# Patient Record
Sex: Female | Born: 1985 | Race: White | Hispanic: Yes | Smoking: Current every day smoker
Health system: Southern US, Community
[De-identification: ages and names within clinical notes are randomized; demographics above are authoritative.]

## PROBLEM LIST (undated history)

## (undated) DIAGNOSIS — F32A Depression, unspecified: Secondary | ICD-10-CM

## (undated) DIAGNOSIS — B009 Herpesviral infection, unspecified: Secondary | ICD-10-CM

## (undated) DIAGNOSIS — F1120 Opioid dependence, uncomplicated: Secondary | ICD-10-CM

## (undated) DIAGNOSIS — Z8619 Personal history of other infectious and parasitic diseases: Secondary | ICD-10-CM

## (undated) DIAGNOSIS — F329 Major depressive disorder, single episode, unspecified: Secondary | ICD-10-CM

## (undated) DIAGNOSIS — F1921 Other psychoactive substance dependence, in remission: Secondary | ICD-10-CM

## (undated) HISTORY — DX: Personal history of other infectious and parasitic diseases: Z86.19

## (undated) HISTORY — DX: Opioid dependence, uncomplicated: F11.20

## (undated) HISTORY — DX: Herpesviral infection, unspecified: B00.9

## (undated) HISTORY — PX: BREAST SURGERY: SHX581

## (undated) HISTORY — PX: BREAST BIOPSY: SHX20

---

## 2013-10-25 LAB — OB RESULTS CONSOLE GC/CHLAMYDIA
CHLAMYDIA, DNA PROBE: NEGATIVE
Gonorrhea: NEGATIVE

## 2013-10-25 LAB — OB RESULTS CONSOLE RPR: RPR: NONREACTIVE

## 2013-10-25 LAB — OB RESULTS CONSOLE HEPATITIS B SURFACE ANTIGEN: Hepatitis B Surface Ag: NEGATIVE

## 2013-10-25 LAB — OB RESULTS CONSOLE HIV ANTIBODY (ROUTINE TESTING): HIV: NONREACTIVE

## 2013-10-25 LAB — OB RESULTS CONSOLE RUBELLA ANTIBODY, IGM: Rubella: IMMUNE

## 2014-01-25 NOTE — L&D Delivery Note (Signed)
Delivery Note At 9:14 PM a viable and healthy female was delivered via Vaginal, Spontaneous Delivery (Presentation: ; Occiput Anterior).  APGAR: 8, 9; weight P .   Placenta status: Intact, Spontaneous.  Cord: 3 vessels with the following complications: Nuchal  Anesthesia: Epidural  Episiotomy: None Lacerations: None Suture Repair: N/A Est. Blood Loss (mL): 400  Mom to postpartum.  Baby to Couplet care / Skin to Skin.  [name], Angelamarie Avakian 04/17/2014, 9:52 PM  Tdap in PNC/RI/Br/O+/Contra?Nexplanon

## 2014-03-25 LAB — OB RESULTS CONSOLE GBS: STREP GROUP B AG: POSITIVE

## 2014-04-17 ENCOUNTER — Encounter (HOSPITAL_COMMUNITY): Payer: Self-pay | Admitting: *Deleted

## 2014-04-17 ENCOUNTER — Inpatient Hospital Stay (HOSPITAL_COMMUNITY): Payer: Medicaid Other | Admitting: Anesthesiology

## 2014-04-17 ENCOUNTER — Inpatient Hospital Stay (HOSPITAL_COMMUNITY)
Admission: AD | Admit: 2014-04-17 | Discharge: 2014-04-19 | DRG: 775 | Disposition: A | Discharge status: Home / Self Care | Payer: Medicaid Other | Source: Ambulatory Visit | Attending: Obstetrics and Gynecology | Admitting: Obstetrics and Gynecology

## 2014-04-17 DIAGNOSIS — O99334 Smoking (tobacco) complicating childbirth: Secondary | ICD-10-CM | POA: Diagnosis present

## 2014-04-17 DIAGNOSIS — Z3A39 39 weeks gestation of pregnancy: Secondary | ICD-10-CM | POA: Diagnosis present

## 2014-04-17 DIAGNOSIS — O99824 Streptococcus B carrier state complicating childbirth: Secondary | ICD-10-CM | POA: Diagnosis present

## 2014-04-17 DIAGNOSIS — F17210 Nicotine dependence, cigarettes, uncomplicated: Secondary | ICD-10-CM | POA: Diagnosis present

## 2014-04-17 DIAGNOSIS — Z3483 Encounter for supervision of other normal pregnancy, third trimester: Secondary | ICD-10-CM | POA: Diagnosis present

## 2014-04-17 HISTORY — DX: Other psychoactive substance dependence, in remission: F19.21

## 2014-04-17 LAB — CBC
HCT: 33.7 % — ABNORMAL LOW (ref 36.0–46.0)
HEMOGLOBIN: 11.5 g/dL — AB (ref 12.0–15.0)
MCH: 32.8 pg (ref 26.0–34.0)
MCHC: 34.1 g/dL (ref 30.0–36.0)
MCV: 96 fL (ref 78.0–100.0)
Platelets: 354 10*3/uL (ref 150–400)
RBC: 3.51 MIL/uL — AB (ref 3.87–5.11)
RDW: 14 % (ref 11.5–15.5)
WBC: 12 10*3/uL — AB (ref 4.0–10.5)

## 2014-04-17 LAB — TYPE AND SCREEN
ABO/RH(D): O POS
ANTIBODY SCREEN: NEGATIVE

## 2014-04-17 LAB — ABO/RH: ABO/RH(D): O POS

## 2014-04-17 LAB — RPR: RPR Ser Ql: NONREACTIVE

## 2014-04-17 MED ORDER — LIDOCAINE HCL (PF) 1 % IJ SOLN
30.0000 mL | INTRAMUSCULAR | Status: DC | PRN
  Filled 2014-04-17: qty 30

## 2014-04-17 MED ORDER — DIPHENHYDRAMINE HCL 25 MG PO CAPS: 25.0000 mg | ORAL_CAPSULE | Freq: Four times a day (QID) | ORAL | Status: DC | PRN

## 2014-04-17 MED ORDER — IBUPROFEN 600 MG PO TABS
600.0000 mg | ORAL_TABLET | Freq: Four times a day (QID) | ORAL | Status: DC
  Administered 2014-04-18 – 2014-04-19 (×4): 600 mg via ORAL
  Filled 2014-04-17 (×6): qty 1

## 2014-04-17 MED ORDER — SENNOSIDES-DOCUSATE SODIUM 8.6-50 MG PO TABS
2.0000 | ORAL_TABLET | ORAL | Status: DC
  Administered 2014-04-18: 2 via ORAL
  Filled 2014-04-17: qty 2

## 2014-04-17 MED ORDER — PHENYLEPHRINE 40 MCG/ML (10ML) SYRINGE FOR IV PUSH (FOR BLOOD PRESSURE SUPPORT)
80.0000 ug | PREFILLED_SYRINGE | INTRAVENOUS | Status: DC | PRN
  Filled 2014-04-17: qty 2

## 2014-04-17 MED ORDER — EPHEDRINE 5 MG/ML INJ
10.0000 mg | INTRAVENOUS | Status: DC | PRN
  Filled 2014-04-17: qty 2

## 2014-04-17 MED ORDER — FLEET ENEMA 7-19 GM/118ML RE ENEM: 1.0000 | ENEMA | RECTAL | Status: DC | PRN

## 2014-04-17 MED ORDER — DIBUCAINE 1 % RE OINT
1.0000 "application " | TOPICAL_OINTMENT | RECTAL | Status: DC | PRN
  Administered 2014-04-18: 1 via RECTAL
  Filled 2014-04-17: qty 28

## 2014-04-17 MED ORDER — CLINDAMYCIN PHOSPHATE 900 MG/50ML IV SOLN
900.0000 mg | Freq: Three times a day (TID) | INTRAVENOUS | Status: DC
  Administered 2014-04-17 (×2): 900 mg via INTRAVENOUS
  Filled 2014-04-17 (×3): qty 50

## 2014-04-17 MED ORDER — BENZOCAINE-MENTHOL 20-0.5 % EX AERO
1.0000 "application " | INHALATION_SPRAY | CUTANEOUS | Status: DC | PRN
  Administered 2014-04-18: 1 via TOPICAL
  Filled 2014-04-17: qty 56

## 2014-04-17 MED ORDER — BUTORPHANOL TARTRATE 1 MG/ML IJ SOLN
1.0000 mg | INTRAMUSCULAR | Status: DC | PRN
  Administered 2014-04-17: 1 mg via INTRAVENOUS
  Filled 2014-04-17: qty 1

## 2014-04-17 MED ORDER — WITCH HAZEL-GLYCERIN EX PADS
1.0000 "application " | MEDICATED_PAD | CUTANEOUS | Status: DC | PRN
  Administered 2014-04-18: 1 via TOPICAL

## 2014-04-17 MED ORDER — ACETAMINOPHEN 325 MG PO TABS: 650.0000 mg | ORAL_TABLET | ORAL | Status: DC | PRN

## 2014-04-17 MED ORDER — OXYCODONE-ACETAMINOPHEN 5-325 MG PO TABS: 2.0000 | ORAL_TABLET | ORAL | Status: DC | PRN

## 2014-04-17 MED ORDER — PHENYLEPHRINE 40 MCG/ML (10ML) SYRINGE FOR IV PUSH (FOR BLOOD PRESSURE SUPPORT)
80.0000 ug | PREFILLED_SYRINGE | INTRAVENOUS | Status: DC | PRN
  Filled 2014-04-17: qty 2
  Filled 2014-04-17: qty 20

## 2014-04-17 MED ORDER — OXYCODONE-ACETAMINOPHEN 5-325 MG PO TABS: 1.0000 | ORAL_TABLET | ORAL | Status: DC | PRN

## 2014-04-17 MED ORDER — DIPHENHYDRAMINE HCL 50 MG/ML IJ SOLN: 12.5000 mg | INTRAMUSCULAR | Status: DC | PRN

## 2014-04-17 MED ORDER — LACTATED RINGERS IV SOLN: 500.0000 mL | Freq: Once | INTRAVENOUS | Status: DC

## 2014-04-17 MED ORDER — LACTATED RINGERS IV SOLN
INTRAVENOUS | Status: DC
  Administered 2014-04-17 (×3): via INTRAVENOUS

## 2014-04-17 MED ORDER — CITRIC ACID-SODIUM CITRATE 334-500 MG/5ML PO SOLN: 30.0000 mL | ORAL | Status: DC | PRN

## 2014-04-17 MED ORDER — OXYTOCIN 40 UNITS IN LACTATED RINGERS INFUSION - SIMPLE MED
1.0000 m[IU]/min | INTRAVENOUS | Status: DC
  Administered 2014-04-17: 2 m[IU]/min via INTRAVENOUS

## 2014-04-17 MED ORDER — LACTATED RINGERS IV SOLN: INTRAVENOUS | Status: DC

## 2014-04-17 MED ORDER — LANOLIN HYDROUS EX OINT: TOPICAL_OINTMENT | CUTANEOUS | Status: DC | PRN

## 2014-04-17 MED ORDER — ZOLPIDEM TARTRATE 5 MG PO TABS: 5.0000 mg | ORAL_TABLET | Freq: Every evening | ORAL | Status: DC | PRN

## 2014-04-17 MED ORDER — LACTATED RINGERS IV SOLN
500.0000 mL | INTRAVENOUS | Status: DC | PRN
  Administered 2014-04-17: 1000 mL via INTRAVENOUS

## 2014-04-17 MED ORDER — TERBUTALINE SULFATE 1 MG/ML IJ SOLN
0.2500 mg | Freq: Once | INTRAMUSCULAR | Status: DC | PRN
Start: 2014-04-17 — End: 2014-04-17
  Filled 2014-04-17: qty 1

## 2014-04-17 MED ORDER — OXYTOCIN BOLUS FROM INFUSION: 500.0000 mL | INTRAVENOUS | Status: DC

## 2014-04-17 MED ORDER — SIMETHICONE 80 MG PO CHEW: 80.0000 mg | CHEWABLE_TABLET | ORAL | Status: DC | PRN

## 2014-04-17 MED ORDER — PRENATAL MULTIVITAMIN CH
1.0000 | ORAL_TABLET | Freq: Every day | ORAL | Status: DC
  Administered 2014-04-18 – 2014-04-19 (×2): 1 via ORAL
  Filled 2014-04-17 (×2): qty 1

## 2014-04-17 MED ORDER — FENTANYL 2.5 MCG/ML BUPIVACAINE 1/10 % EPIDURAL INFUSION (WH - ANES)
INTRAMUSCULAR | Status: DC | PRN
  Administered 2014-04-17: 14 mL/h via EPIDURAL

## 2014-04-17 MED ORDER — ONDANSETRON HCL 4 MG/2ML IJ SOLN: 4.0000 mg | Freq: Four times a day (QID) | INTRAMUSCULAR | Status: DC | PRN

## 2014-04-17 MED ORDER — FENTANYL 2.5 MCG/ML BUPIVACAINE 1/10 % EPIDURAL INFUSION (WH - ANES)
14.0000 mL/h | INTRAMUSCULAR | Status: DC | PRN
  Filled 2014-04-17: qty 125

## 2014-04-17 MED ORDER — OXYTOCIN 40 UNITS IN LACTATED RINGERS INFUSION - SIMPLE MED
62.5000 mL/h | INTRAVENOUS | Status: DC
  Filled 2014-04-17: qty 1000

## 2014-04-17 MED ORDER — LIDOCAINE HCL (PF) 1 % IJ SOLN
INTRAMUSCULAR | Status: DC | PRN
  Administered 2014-04-17 (×2): 4 mL

## 2014-04-17 MED ORDER — ONDANSETRON HCL 4 MG/2ML IJ SOLN
4.0000 mg | INTRAMUSCULAR | Status: DC | PRN
Start: 2014-04-17 — End: 2014-04-19

## 2014-04-17 MED ORDER — ONDANSETRON HCL 4 MG PO TABS: 4.0000 mg | ORAL_TABLET | ORAL | Status: DC | PRN

## 2014-04-17 NOTE — Anesthesia Procedure Notes (Signed)
Epidural Patient location during procedure: OB Start time: 04/17/2014 2:50 PM  Staffing Anesthesiologist: Karie SchwalbeJUDD, Tikita Mabee Performed by: anesthesiologist   Preanesthetic Checklist Completed: patient identified, site marked, surgical consent, pre-op evaluation, timeout performed, IV checked, risks and benefits discussed and monitors and equipment checked  Epidural Patient position: sitting Prep: site prepped and draped and DuraPrep Patient monitoring: continuous pulse ox and blood pressure Approach: midline Location: L3-L4 Injection technique: LOR saline  Needle:  Needle type: Tuohy  Needle gauge: 17 G Needle length: 9 cm and 9 Needle insertion depth: 5 cm cm Catheter type: closed end flexible Catheter size: 19 Gauge Catheter at skin depth: 10 cm Test dose: negative  Assessment Events: blood not aspirated, injection not painful, no injection resistance, negative IV test and no paresthesia  Additional Notes Patient identified. Risks/Benefits/Options discussed with patient including but not limited to bleeding, infection, nerve damage, paralysis, failed block, incomplete pain control, headache, blood pressure changes, nausea, vomiting, reactions to medication both or allergic, itching and postpartum back pain. Confirmed with bedside nurse the patient's most recent platelet count. Confirmed with patient that they are not currently taking any anticoagulation, have any bleeding history or any family history of bleeding disorders. Patient expressed understanding and wished to proceed. All questions were answered. Sterile technique was used throughout the entire procedure. Please see nursing notes for vital signs. Test dose was given through epidural catheter and negative prior to continuing to dose epidural or start infusion. Warning signs of high block given to the patient including shortness of breath, tingling/numbness in hands, complete motor block, or any concerning symptoms with instructions  to call for help. Patient was given instructions on fall risk and not to get out of bed. All questions and concerns addressed with instructions to call with any issues or inadequate analgesia.

## 2014-04-17 NOTE — Progress Notes (Signed)
Patient ID: Beth GreenlandAngelica Colasurdo, female   DOB: 10/03/1985, 29 y.o.   MRN: [id number]  ROM this AM 3:30, clear  +FM, no c/o's, cont LOF  AFVSS gen NAD FHTs 130's good var, category 1 toco occ  SVE 1/30/-3, vtx Posterior  Start pitocin to augment Epidural prn

## 2014-04-17 NOTE — Anesthesia Preprocedure Evaluation (Signed)
Anesthesia Evaluation  Patient identified by MRN, date of birth, ID band Patient awake    Reviewed: Allergy & Precautions, NPO status , Patient's Chart, lab work & pertinent test results  History of Anesthesia Complications Negative for: history of anesthetic complications  Airway Mallampati: II  TM Distance: >3 FB Neck ROM: Full    Dental no notable dental hx. (+) Dental Advisory Given   Pulmonary Current Smoker,  breath sounds clear to auscultation  Pulmonary exam normal       Cardiovascular negative cardio ROS  Rhythm:Regular Rate:Normal     Neuro/Psych negative neurological ROS  negative psych ROS   GI/Hepatic negative GI ROS, Neg liver ROS,   Endo/Other  negative endocrine ROSobesity  Renal/GU negative Renal ROS  negative genitourinary   Musculoskeletal negative musculoskeletal ROS (+)   Abdominal   Peds negative pediatric ROS (+)  Hematology negative hematology ROS (+)   Anesthesia Other Findings   Reproductive/Obstetrics (+) Pregnancy                             Anesthesia Physical Anesthesia Plan  ASA: II  Anesthesia Plan: Epidural   Post-op Pain Management:    Induction:   Airway Management Planned:   Additional Equipment:   Intra-op Plan:   Post-operative Plan:   Informed Consent: I have reviewed the patients History and Physical, chart, labs and discussed the procedure including the risks, benefits and alternatives for the proposed anesthesia with the patient or authorized representative who has indicated his/her understanding and acceptance.   Dental advisory given  Plan Discussed with: CRNA  Anesthesia Plan Comments:         Anesthesia Quick Evaluation

## 2014-04-17 NOTE — H&P (Signed)
NAMHowie [name]:  [name], [name]          ACCOUNT NO.:  [id number]  MEDICAL RECORD NO.:  [id number]  LOCATION:  9169                          FACILITY:  [facility]  PHYSICIAN:  [name], M.D. DATE OF BIRTH:  26-Feb-1985  DATE OF ADMISSION:  04/17/2014 DATE OF DISCHARGE:                             HISTORY & PHYSICAL   HISTORY OF PRESENT ILLNESS:  This is a 29 year old white female, para 2- 0-1-2, [facility] with premature rupture of the membranes.  Her blood group and type is O positive with a negative antibody, RPR negative, urine culture negative, hepatitis B surface antigen negative, HIV negative, GC and Chlamydia negative, rubella immune, cystic fibrosis screen negative, first trimester screen negative, MSAFP negative.  One-hour Glucola 71, group B strep positive. The patient began her prenatal course at 13 weeks.  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  At 31 weeks, she had some runs of palpitations, resolved with rest.  She was advised to begin Valtrex suppression at 36 weeks.  At 3:30 a.m., she had spontaneous rupture of membranes, came to the hospital, ruptured membranes was confirmed, and she was admitted.  Since admission, she has begun contracting infrequently.  Nothing painful.  Her group B strep was positive, was tested to clindamycin and resistance was not detected.  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  She has had sinusitis a couple of times a year, breast reduction in 2009.  ALLERGIES:  Amoxicillin and penicillin, both caused hives as a child. No latex allergy.  No food allergy.  SOCIAL HISTORY:  The patient is a current every day smoker, one-half pack a day at the onset of pregnancy.  Does not drink.  Denies illicit drugs for 20  months, has a 12th grade high school education plus 2 years of college.  She works as a Conservation officer, naturecashier at [employer].  At the onset of pregnancy, had a partner for 6 months, but she is single.  FAMILY HISTORY:  Mother cancer of the breast at age 31 and a disorder of her thyroid gland.  Maternal grandmother cancer of the breast, diabetes, and cancer of the lung.  Maternal grandfather heart disease and diabetes and father died at age 31 of an MI.  PHYSICAL EXAMINATION:  VITAL SIGNS:  On admission, temperature 97.7, pulse 68, respirations 18, blood pressure 113/70. HEART:  Normal size and sounds.  No murmurs. LUNGS:  Clear to auscultation.  At her last prenatal visit, the blood pressure was 116/74, the cervix was 0 cm, 20,% and the fundal height was 36 cm.  On admission to the hospital, the cervix was thought to be maybe 1 cm posterior.  Rupture of membranes was confirmed.  ADMITTING IMPRESSION: 1. Intrauterine pregnancy at 39 weeks and 1 day. 2. Premature rupture of the membranes. 3. Positive group B strep with a penicillin allergy.  She is admitted     and started on clindamycin.  The group B strep was tested for     clindamycin  resistance and none was found.     Beth Pro. Ambrose Mantle, M.D.     TFH/MEDQ  D:  04/17/2014  T:  04/17/2014  Job:  [id number]

## 2014-04-17 NOTE — Progress Notes (Signed)
Patient ID: [name], female   DOB: 06/26/1985, 29 y.o.   MRN: [id number]  Comfortable with epidural  AFVSS gen NAD FHTs 120-130's, mod var, category 1 toco q 2-254min  SVE 4/70/-1  Continue current mgmt Expect SVD

## 2014-04-17 NOTE — Plan of Care (Signed)
Problem: Consults Goal: Birthing Suites Patient Information Press F2 to bring up selections list Outcome: Completed/Met Date Met:  04/17/14  Pt 37-[redacted] weeks EGA and Other (specify with a note) SROM

## 2014-04-17 NOTE — MAU Note (Signed)
Pt states that she SROM at 0330 and denies bleeding. Pt states that baby is active and she is starting to feel some tightnings but no pain.

## 2014-04-17 NOTE — MAU Note (Signed)
Pt reports ROM at 0330,

## 2014-04-18 LAB — CBC
HEMATOCRIT: 30.5 % — AB (ref 36.0–46.0)
HEMOGLOBIN: 10.5 g/dL — AB (ref 12.0–15.0)
MCH: 33 pg (ref 26.0–34.0)
MCHC: 34.4 g/dL (ref 30.0–36.0)
MCV: 95.9 fL (ref 78.0–100.0)
Platelets: 324 10*3/uL (ref 150–400)
RBC: 3.18 MIL/uL — ABNORMAL LOW (ref 3.87–5.11)
RDW: 13.8 % (ref 11.5–15.5)
WBC: 18.2 10*3/uL — AB (ref 4.0–10.5)

## 2014-04-18 LAB — CCBB MATERNAL DONOR DRAW

## 2014-04-18 NOTE — Progress Notes (Signed)
UR chart review completed.  

## 2014-04-18 NOTE — Anesthesia Postprocedure Evaluation (Signed)
Anesthesia Post Note  Patient: [name]  Procedure(s) Performed: * No procedures listed *  Anesthesia type: Epidural  Patient location: Mother/Baby  Post pain: Pain level controlled  Post assessment: Post-op Vital signs reviewed  Last Vitals:  Filed Vitals:   04/18/14 0410  BP: 104/68  Pulse: 68  Temp: 36.7 C  Resp: 18    Post vital signs: Reviewed  Level of consciousness:alert  Complications: No apparent anesthesia complications

## 2014-04-18 NOTE — Lactation Note (Signed)
This note was copied from the chart of Beth Alexza Rubye Oaksickerson. Lactation Consultation Note  Patient Name: [name] AVWUJ'WToday's Date: 04/18/2014 Reason for consult: Initial assessment;Other (Comment) (mom had Breast reduction ( bilaterally 2009 after 1st 2 babies )  Baby is 7716 hours old and has been to the breast several times 10 -30 mins. Per mom the baby last fed at 1315 for 10 mins. Per mom swallows noted and the abby sustained latch . Per mom had to release suction baby fell asleep after 10 mins . Also voided. LC discussed with mom and dad - with a hx of breast reduction , it is always a wait and see for milk volume and supply .  Using a DEBP for post pumping is indicated to enhance milk supply . Per mom has had several breast changes - sore ness ( nipples and breast ),  Areolas darkened, cup size increased form C to Triple E . LC mentioned to mom all the signs are good  for milk supply.  Post pumping would still be indicated. LC assessed breast tissue with moms permission, ( per mom thinking the nipple and areola were removed and reapplied. Nipple erect, and areola compressible , no adhesions at the base of the nipple. LC reviewed hand expressing and latching technique to consistently obtain depth. Did not see colostrum when hand expressed. Since the baby just fed for 10 mins , and is presently sleeping , LC encouraged mom to call for LC feeding assessment.  MBU RN aware mom needs to be set up with a DEBP.  Mother informed of post-discharge support and given phone number to the lactation department, including services for phone call assistance; out-patient appointments;  and breastfeeding support group. List of other breastfeeding resources in the community given in the handout. Encouraged mother to call for problems or concerns related to breastfeeding.    Maternal Data Has patient been taught Hand Expression?: Yes Does the patient have breastfeeding experience prior to this  delivery?: Yes  Feeding Feeding Type:  (baby just finished breast feeding )  LATCH Score/Interventions                Intervention(s): Breastfeeding basics reviewed (see LC note )     Lactation Tools Discussed/Used     Consult Status Consult Status: Follow-up Date: 04/18/14 Follow-up type: In-patient    Kathrin Greathouseorio, Khayman Kirsch Ann 04/18/2014, 1:51 PM

## 2014-04-18 NOTE — Progress Notes (Signed)
Post Partum Day 1 Subjective: no complaints, up ad lib, tolerating PO and nl lochia, pain controlled  Objective: Blood pressure 104/68, pulse 68, temperature 98 F (36.7 C), temperature source Oral, resp. rate 18, height 5\' 4"  (1.626 m), weight 87.232 kg (192 lb 5 oz), SpO2 100 %, unknown if currently breastfeeding.  Physical Exam:  General: alert and no distress Lochia: appropriate Uterine Fundus: firm   Recent Labs  04/17/14 0540 04/18/14 0545  HGB 11.5* 10.5*  HCT 33.7* 30.5*    Assessment/Plan: Plan for discharge tomorrow, Breastfeeding and Lactation consult.  Routine care.     LOS: 1 day   [name] 04/18/2014, 7:44 AM

## 2014-04-19 MED ORDER — IBUPROFEN 600 MG PO TABS: 600.0000 mg | ORAL_TABLET | Freq: Four times a day (QID) | ORAL | Status: DC

## 2014-04-19 NOTE — Lactation Note (Signed)
This note was copied from the chart of Beth Kyree Rubye Oaksickerson. Lactation Consultation Note: Mother is using a #24 nipple shield . Observed infant  Vigorous with strong suckling. Mother states that the nipple shield makes breastfeeding so much better. She has been post pumping and obtaining 20-30 ml. Mother has been supplement with a curved tip syringe. Advised to prefill the nipple shield and give infant EBM with syringe while infant is latched. Mothers breast are filling. She states she is active with WIC. [facility]. She was given a hand pump with #30 flanges. Mother advised to post pump for 15 mins on each breast. Advised mother in treatment of severe engorgement. Mother advised to breastfeed 8-12 times in 24 hours. She was also scheduled a LC follow up on March 30 at 9am.   Patient Name: [name] Today's Date: 04/19/2014 Reason for consult: Follow-up assessment   Maternal Data    Feeding Feeding Type: Breast Fed Length of feed: 10 min (observied small amt of milk in the nipple shield)  LATCH Score/Interventions Latch: Grasps breast easily, tongue down, lips flanged, rhythmical sucking. Intervention(s): Adjust position  Audible Swallowing: Spontaneous and intermittent Intervention(s): Skin to skin  Type of Nipple: Everted at rest and after stimulation  Comfort (Breast/Nipple): Filling, red/small blisters or bruises, mild/mod discomfort  Problem noted: Filling;Mild/Moderate discomfort  Hold (Positioning): No assistance needed to correctly position infant at breast. Intervention(s): Support Pillows;Position options  LATCH Score: 9  Lactation Tools Discussed/Used Tools: Nipple Shields Nipple shield size: 24 Shell Type: Sore   Consult Status Consult Status: Follow-up Date: 04/24/14 Follow-up type: In-patient    Stevan BornKendrick, Nikeria Kalman [facility] 04/19/2014, 10:31 AM

## 2014-04-19 NOTE — Discharge Summary (Signed)
Obstetric Discharge Summary Reason for Admission: rupture of membranes Prenatal Procedures: none Intrapartum Procedures: spontaneous vaginal delivery Postpartum Procedures: none Complications-Operative and Postpartum: none HEMOGLOBIN  Date Value Ref Range Status  04/18/2014 10.5* 12.0 - 15.0 g/dL Final   HCT  Date Value Ref Range Status  04/18/2014 30.5* 36.0 - 46.0 % Final    Physical Exam:  General: alert Lochia: appropriate Uterine Fundus: firm   Discharge Diagnoses: Term Pregnancy-delivered  Discharge Information: Date: 04/19/2014 Activity: pelvic rest Diet: routine Medications: Ibuprofen Condition: stable Instructions: refer to practice specific booklet Discharge to: home Follow-up Information    Follow up with [name], Jody, MD. Schedule an appointment as soon as possible for a visit in 6 weeks.   Specialty:  Obstetrics and Gynecology   Contact information:   [address] [place] [id number] [phone]       Newborn Data: Live born female  Birth Weight: 7 lb 2.5 oz (3245 g) APGAR: 8, 9  Home with mother.  Osualdo Hansell D 04/19/2014, 9:47 AM

## 2014-04-19 NOTE — Lactation Note (Signed)
This note was copied from the chart of Beth Taite Rubye Oaksickerson. Lactation Consultation Note Requested LC for difficulty latching. Mom has had breast reduction in past, has semi flat nipples, compressible, rolls well in finger tips to large everted nipples. Fitted w/#24NS, gave #20 as well. Nipples Especially Lt. Nipple longated. Mom has large breast, colostrum flows out well. Latched , NS kinda large for baby's mouth, when positioned and adjusted flange, latched well and BF 10 min, aggressive. Heard swallows. Moms breast filling. Has DEBP set up in rm. Instructed on set up and cleaning, mom pumped 15ml.encouraged to post-pump for 15 min. D/t breast reduction and stimulating breast.  Gave shells to assist in everting nipples. Encouraged to wear in bra.  Discussed proper latching and positions. Answered several questions.  Patient Name: [name] JYNWG'NToday's Date: 04/19/2014 Reason for consult: Follow-up assessment;Difficult latch;Breast/nipple pain   Maternal Data    Feeding Feeding Type: Breast Fed Length of feed: 15 min  LATCH Score/Interventions Latch: Grasps breast easily, tongue down, lips flanged, rhythmical sucking. Intervention(s): Adjust position;Assist with latch;Breast massage;Breast compression  Audible Swallowing: Spontaneous and intermittent Intervention(s): Skin to skin;Hand expression  Type of Nipple: Everted at rest and after stimulation (short shaft)  Comfort (Breast/Nipple): Filling, red/small blisters or bruises, mild/mod discomfort  Problem noted: Mild/Moderate discomfort Interventions (Mild/moderate discomfort): Comfort gels;Breast shields;Post-pump;Hand massage;Hand expression  Hold (Positioning): Assistance needed to correctly position infant at breast and maintain latch. Intervention(s): Skin to skin;Position options;Support Pillows;Breastfeeding basics reviewed  LATCH Score: 8  Lactation Tools Discussed/Used Tools: Shells;Nipple  Shields;Pump;Comfort gels Nipple shield size: 20;24 Shell Type: Inverted Breast pump type: Double-Electric Breast Pump Pump Review: Setup, frequency, and cleaning;Milk Storage Initiated by:: RN/ L. Sadrac Zeoli RN Date initiated:: 04/18/14   Consult Status Consult Status: Follow-up Date: 04/20/14 Follow-up type: In-patient    Candido Flott, Diamond NickelLAURA G 04/19/2014, 6:05 AM

## 2014-04-19 NOTE — Progress Notes (Signed)
PPD #2 Doing well Afeb, VSS D/c home 

## 2014-04-19 NOTE — Discharge Instructions (Signed)
As per discharge pamphlet °

## 2014-04-24 ENCOUNTER — Ambulatory Visit (HOSPITAL_COMMUNITY): Admit: 2014-04-24 | Payer: Medicaid Other

## 2014-04-30 ENCOUNTER — Ambulatory Visit (HOSPITAL_COMMUNITY): Payer: Medicaid Other

## 2014-05-03 ENCOUNTER — Ambulatory Visit (HOSPITAL_COMMUNITY)
Admission: RE | Admit: 2014-05-03 | Discharge: 2014-05-03 | Disposition: A | Discharge status: Home / Self Care | Payer: Medicaid Other | Source: Ambulatory Visit | Attending: Obstetrics and Gynecology | Admitting: Obstetrics and Gynecology

## 2014-05-03 NOTE — Lactation Note (Signed)
Lactation Consult  Mother's reason for visit:  Feeding assessment Visit Type:  Outpatient Appointment Notes:  Mom was d/c'd from hospital using a nipple shield. She stopped using the nipple shield 1 week ago and would like LC to check latch. Mom reports some occasional mild nipple tenderness.  Consult:  Initial Lactation Consultant:  Alfred Levins  ________________________________________________________________________  Baby's Name: Kennon Portela Date of Birth: 04/17/2016 Pediatrician: Dr. Deniece Ree [facility] Gender: female Gestational Age: [redacted]w[redacted]d (At Birth) Birth Weight: 7 lb 2.5 oz (3245 g) Weight at Discharge: Weight: 6 lb 13.2 oz (3095 g)Date of Discharge: 04/19/2014 [facility] Weights   04/17/14 2114 04/19/14 0134  Weight: 7 lb 2.5 oz (3245 g) 6 lb 13.2 oz (3095 g)   [facility]  7 lb. 6.5 Location:Pediatrician's office Weight today:  7 lb. 12.5 oz/3530 gm     ________________________________________________________________________  Mother's Name: Candies Topp Type of delivery:  SVB Breastfeeding Experience:  P3 - Did not BF 1st baby - had pyloric stenosis, 2nd baby BF 11 months Maternal Medical Conditions:  Breast reduction, Smoker Maternal Medications:  PNV, Valtrex  ________________________________________________________________________  Breastfeeding History (Post Discharge)  Frequency of breastfeeding:  Sometimes within 45 minutes of last feeding, sometimes every 2-3 hours Duration of feeding:  15-30 minutes.     Pumping  Type of pump:  Manual Frequency:  1 time per day Volume:  90 ml  Infant Intake and Output Assessment  Voids:  6-7 in 24 hrs.  Color:  Clear yellow Stools:  6-10 in 24 hrs.  Color:  Yellow  ________________________________________________________________________  Maternal Breast Assessment  Breast:  Soft Nipple:  Erect Pain  level:  0   _______________________________________________________________________ Feeding Assessment/Evaluation  Initial feeding assessment:  Infant's oral assessment:  WNL  Positioning:  Cross Cradle Right breast  LATCH documentation:  Latch:  2 = Grasps breast easily, tongue down, lips flanged, rhythmical sucking.  Audible swallowing:  2 = Spontaneous and intermittent  Type of nipple:  2 = Everted at rest and after stimulation  Comfort (Breast/Nipple):  2 = Soft / non-tender  Hold (Positioning):  2 = No assistance needed to correctly position infant at breast  LATCH score:  10  Attached assessment:  Deep  Lips flanged:  Yes.    Lips untucked:  Yes.    Suck assessment:  Nutritive  Tools:  N/A Instructed on use and cleaning of tool:  N/A  Pre-feed weight:  3530 g  (7 lb. 12.5 oz.) Post-feed weight:  3578 g (7 lb. 14.2 oz.) Amount transferred:  48 ml.  Baby nursed for 10 minutes on right breast transferring 32 ml of breast milk. Mom re-latched baby for an additional 10 minutes for baby to transfer an additional 16 ml for a total of 48 ml from the right breast.    Additional Feeding Assessment -   Infant's oral assessment:  WNL  Positioning:  Cross cradle Left breast  LATCH documentation:  Latch:  2 = Grasps breast easily, tongue down, lips flanged, rhythmical sucking.  Audible swallowing:  2 = Spontaneous and intermittent  Type of nipple:  2 = Everted at rest and after stimulation  Comfort (Breast/Nipple):  2 = Soft / non-tender  Hold (Positioning):  2 = No assistance needed to correctly position infant at breast  LATCH score:  10  Attached assessment:  Deep  Lips flanged:  Yes.    Lips untucked:  Yes.    Suck assessment:  Nutritive  Tools:  N/A Instructed on use and cleaning of tool:  N/A  Pre-feed weight:  3578 g  (7  lb. 14.2 oz.) Post-feed weight:  3618 g (7 lb. 15.6 oz.) Amount transferred:  40 ml  Baby nursed for 20 minutes on the left  breast   Total amount transferred:  88 ml Total supplement given:  0 ml  Baby Gianna transferred milk well at this visit. She is 572 weeks old and transferred 88 ml. LC encouraged Mom to keep Baby Gianna at the breast for 15-20 minutes but to try and BF both breasts each feeding when possible. This will be good for Mom's milk supply with history of breast reduction and Baby Kandace BlitzGianna will get more volume with each feeding to support her weight and keep her more satisfied at the breast.  Encouraged Mom to pump at least 2 times/day for 15 minutes to support milk production. When starts to introduce bottles, advised Mom to be sure to pump to replace baby being at the breast. Mom inquired when to introduce bottles, LC advised around 333-444 weeks of age.  Encouraged intermittent pre/post weight checks to be sure milk volume is keeping up with baby's needs. Advised of support group.

## 2014-12-12 ENCOUNTER — Emergency Department (HOSPITAL_COMMUNITY)
Admission: EM | Admit: 2014-12-12 | Discharge: 2014-12-12 | Disposition: A | Discharge status: Home / Self Care | Payer: No Typology Code available for payment source | Attending: Emergency Medicine | Admitting: Emergency Medicine

## 2014-12-12 ENCOUNTER — Emergency Department (HOSPITAL_COMMUNITY): Payer: No Typology Code available for payment source

## 2014-12-12 ENCOUNTER — Encounter (HOSPITAL_COMMUNITY): Payer: Self-pay | Admitting: Emergency Medicine

## 2014-12-12 DIAGNOSIS — Y92410 Unspecified street and highway as the place of occurrence of the external cause: Secondary | ICD-10-CM | POA: Diagnosis not present

## 2014-12-12 DIAGNOSIS — Z79899 Other long term (current) drug therapy: Secondary | ICD-10-CM | POA: Diagnosis not present

## 2014-12-12 DIAGNOSIS — F17210 Nicotine dependence, cigarettes, uncomplicated: Secondary | ICD-10-CM | POA: Diagnosis not present

## 2014-12-12 DIAGNOSIS — Y9389 Activity, other specified: Secondary | ICD-10-CM | POA: Insufficient documentation

## 2014-12-12 DIAGNOSIS — S00511A Abrasion of lip, initial encounter: Secondary | ICD-10-CM | POA: Insufficient documentation

## 2014-12-12 DIAGNOSIS — S199XXA Unspecified injury of neck, initial encounter: Secondary | ICD-10-CM | POA: Diagnosis not present

## 2014-12-12 DIAGNOSIS — Z880 Allergy status to penicillin: Secondary | ICD-10-CM | POA: Diagnosis not present

## 2014-12-12 DIAGNOSIS — S0081XA Abrasion of other part of head, initial encounter: Secondary | ICD-10-CM | POA: Insufficient documentation

## 2014-12-12 DIAGNOSIS — S0993XA Unspecified injury of face, initial encounter: Secondary | ICD-10-CM | POA: Diagnosis present

## 2014-12-12 DIAGNOSIS — Y998 Other external cause status: Secondary | ICD-10-CM | POA: Insufficient documentation

## 2014-12-12 MED ORDER — ACETAMINOPHEN 325 MG PO TABS
650.0000 mg | ORAL_TABLET | Freq: Once | ORAL | Status: AC
  Administered 2014-12-12: 650 mg via ORAL
  Filled 2014-12-12: qty 2

## 2014-12-12 MED ORDER — IBUPROFEN 400 MG PO TABS
800.0000 mg | ORAL_TABLET | Freq: Once | ORAL | Status: AC
  Administered 2014-12-12: 800 mg via ORAL
  Filled 2014-12-12: qty 2

## 2014-12-12 NOTE — ED Notes (Signed)
Patient is alert and orientedx4.  Patient was explained discharge instructions and they understood them with no questions.  The patient's friend is taking her home.

## 2014-12-12 NOTE — Discharge Instructions (Signed)
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
## 2017-09-11 ENCOUNTER — Encounter (HOSPITAL_COMMUNITY): Payer: Self-pay

## 2017-09-11 ENCOUNTER — Inpatient Hospital Stay (HOSPITAL_COMMUNITY)
Admission: AD | Admit: 2017-09-11 | Discharge: 2017-09-14 | DRG: 885 | Disposition: A | Discharge status: Home / Self Care | Payer: Medicaid Other | Source: Intra-hospital | Attending: Psychiatry | Admitting: Psychiatry

## 2017-09-11 ENCOUNTER — Emergency Department (HOSPITAL_COMMUNITY)
Admission: EM | Admit: 2017-09-11 | Discharge: 2017-09-11 | Disposition: A | Discharge status: Other Acute Inpatient Hospital | Payer: Medicaid Other | Attending: Physician Assistant | Admitting: Physician Assistant

## 2017-09-11 DIAGNOSIS — N760 Acute vaginitis: Secondary | ICD-10-CM | POA: Insufficient documentation

## 2017-09-11 DIAGNOSIS — F1193 Opioid use, unspecified with withdrawal: Secondary | ICD-10-CM | POA: Diagnosis present

## 2017-09-11 DIAGNOSIS — R1110 Vomiting, unspecified: Secondary | ICD-10-CM | POA: Diagnosis present

## 2017-09-11 DIAGNOSIS — F314 Bipolar disorder, current episode depressed, severe, without psychotic features: Secondary | ICD-10-CM | POA: Insufficient documentation

## 2017-09-11 DIAGNOSIS — F419 Anxiety disorder, unspecified: Secondary | ICD-10-CM | POA: Diagnosis present

## 2017-09-11 DIAGNOSIS — F3130 Bipolar disorder, current episode depressed, mild or moderate severity, unspecified: Secondary | ICD-10-CM | POA: Diagnosis present

## 2017-09-11 DIAGNOSIS — F1010 Alcohol abuse, uncomplicated: Secondary | ICD-10-CM | POA: Diagnosis not present

## 2017-09-11 DIAGNOSIS — B9689 Other specified bacterial agents as the cause of diseases classified elsewhere: Secondary | ICD-10-CM | POA: Diagnosis not present

## 2017-09-11 DIAGNOSIS — F315 Bipolar disorder, current episode depressed, severe, with psychotic features: Secondary | ICD-10-CM | POA: Diagnosis present

## 2017-09-11 DIAGNOSIS — Z915 Personal history of self-harm: Secondary | ICD-10-CM

## 2017-09-11 DIAGNOSIS — Z5321 Procedure and treatment not carried out due to patient leaving prior to being seen by health care provider: Secondary | ICD-10-CM | POA: Diagnosis not present

## 2017-09-11 DIAGNOSIS — F17210 Nicotine dependence, cigarettes, uncomplicated: Secondary | ICD-10-CM | POA: Diagnosis present

## 2017-09-11 DIAGNOSIS — R45851 Suicidal ideations: Secondary | ICD-10-CM | POA: Diagnosis not present

## 2017-09-11 DIAGNOSIS — Z599 Problem related to housing and economic circumstances, unspecified: Secondary | ICD-10-CM

## 2017-09-11 DIAGNOSIS — G4700 Insomnia, unspecified: Secondary | ICD-10-CM | POA: Diagnosis present

## 2017-09-11 DIAGNOSIS — B379 Candidiasis, unspecified: Secondary | ICD-10-CM | POA: Diagnosis present

## 2017-09-11 DIAGNOSIS — F1910 Other psychoactive substance abuse, uncomplicated: Secondary | ICD-10-CM | POA: Diagnosis not present

## 2017-09-11 HISTORY — DX: Major depressive disorder, single episode, unspecified: F32.9

## 2017-09-11 HISTORY — DX: Depression, unspecified: F32.A

## 2017-09-11 LAB — RAPID URINE DRUG SCREEN, HOSP PERFORMED
Amphetamines: NOT DETECTED
Barbiturates: NOT DETECTED
Benzodiazepines: NOT DETECTED
COCAINE: NOT DETECTED
Opiates: POSITIVE — AB
Tetrahydrocannabinol: POSITIVE — AB

## 2017-09-11 LAB — COMPREHENSIVE METABOLIC PANEL
ALK PHOS: 53 U/L (ref 38–126)
ALT: 9 U/L (ref 0–44)
AST: 15 U/L (ref 15–41)
Albumin: 3.9 g/dL (ref 3.5–5.0)
Anion gap: 8 (ref 5–15)
BUN: 12 mg/dL (ref 6–20)
CALCIUM: 9.1 mg/dL (ref 8.9–10.3)
CO2: 26 mmol/L (ref 22–32)
Chloride: 106 mmol/L (ref 98–111)
Creatinine, Ser: 0.99 mg/dL (ref 0.44–1.00)
Glucose, Bld: 85 mg/dL (ref 70–99)
POTASSIUM: 4.7 mmol/L (ref 3.5–5.1)
Sodium: 140 mmol/L (ref 135–145)
Total Bilirubin: 0.6 mg/dL (ref 0.3–1.2)
Total Protein: 6.9 g/dL (ref 6.5–8.1)

## 2017-09-11 LAB — ACETAMINOPHEN LEVEL

## 2017-09-11 LAB — URINALYSIS, ROUTINE W REFLEX MICROSCOPIC
BACTERIA UA: NONE SEEN
Bilirubin Urine: NEGATIVE
Glucose, UA: NEGATIVE mg/dL
Hgb urine dipstick: NEGATIVE
Ketones, ur: NEGATIVE mg/dL
Leukocytes, UA: NEGATIVE
Nitrite: NEGATIVE
PROTEIN: 30 mg/dL — AB
Specific Gravity, Urine: 1.026 (ref 1.005–1.030)
pH: 7 (ref 5.0–8.0)

## 2017-09-11 LAB — CBC
HCT: 38.8 % (ref 36.0–46.0)
Hemoglobin: 12.8 g/dL (ref 12.0–15.0)
MCH: 34.5 pg — ABNORMAL HIGH (ref 26.0–34.0)
MCHC: 33 g/dL (ref 30.0–36.0)
MCV: 104.6 fL — AB (ref 78.0–100.0)
Platelets: 291 10*3/uL (ref 150–400)
RBC: 3.71 MIL/uL — ABNORMAL LOW (ref 3.87–5.11)
RDW: 12.7 % (ref 11.5–15.5)
WBC: 19.6 10*3/uL — AB (ref 4.0–10.5)

## 2017-09-11 LAB — WET PREP, GENITAL
Sperm: NONE SEEN
TRICH WET PREP: NONE SEEN
YEAST WET PREP: NONE SEEN

## 2017-09-11 LAB — ETHANOL

## 2017-09-11 LAB — SALICYLATE LEVEL: Salicylate Lvl: <7 mg/dL (ref 2.8–30.0)

## 2017-09-11 LAB — I-STAT BETA HCG BLOOD, ED (MC, WL, AP ONLY): I-stat hCG, quantitative: <5 m[IU]/mL (ref <5)

## 2017-09-11 MED ORDER — LOPERAMIDE HCL 2 MG PO CAPS: 2.0000 mg | ORAL_CAPSULE | ORAL | Status: DC | PRN

## 2017-09-11 MED ORDER — ONDANSETRON 4 MG PO TBDP
4.0000 mg | ORAL_TABLET | Freq: Four times a day (QID) | ORAL | Status: DC | PRN
  Administered 2017-09-12 – 2017-09-14 (×2): 4 mg via ORAL
  Filled 2017-09-11: qty 1

## 2017-09-11 MED ORDER — DICYCLOMINE HCL 20 MG PO TABS: 20.0000 mg | ORAL_TABLET | Freq: Four times a day (QID) | ORAL | Status: DC | PRN

## 2017-09-11 MED ORDER — ACETAMINOPHEN 325 MG PO TABS: 650.0000 mg | ORAL_TABLET | Freq: Four times a day (QID) | ORAL | Status: DC | PRN

## 2017-09-11 MED ORDER — CLINDAMYCIN PHOSPHATE 2 % VA CREA: 1.0000 | TOPICAL_CREAM | Freq: Every day | VAGINAL | Status: DC

## 2017-09-11 MED ORDER — DICYCLOMINE HCL 20 MG PO TABS
20.0000 mg | ORAL_TABLET | Freq: Four times a day (QID) | ORAL | Status: DC | PRN
  Administered 2017-09-11: 20 mg via ORAL
  Filled 2017-09-11: qty 1

## 2017-09-11 MED ORDER — CLONIDINE HCL 0.1 MG PO TABS
0.1000 mg | ORAL_TABLET | ORAL | Status: DC
  Filled 2017-09-11 (×2): qty 1

## 2017-09-11 MED ORDER — LORAZEPAM 1 MG PO TABS: 0.0000 mg | ORAL_TABLET | Freq: Two times a day (BID) | ORAL | Status: DC

## 2017-09-11 MED ORDER — LAMOTRIGINE 25 MG PO TABS
25.0000 mg | ORAL_TABLET | Freq: Every day | ORAL | Status: DC
  Administered 2017-09-12 – 2017-09-13 (×2): 25 mg via ORAL
  Filled 2017-09-11 (×5): qty 1

## 2017-09-11 MED ORDER — METHOCARBAMOL 500 MG PO TABS
500.0000 mg | ORAL_TABLET | Freq: Three times a day (TID) | ORAL | Status: DC | PRN
  Administered 2017-09-11: 500 mg via ORAL
  Filled 2017-09-11: qty 1

## 2017-09-11 MED ORDER — TRAZODONE HCL 100 MG PO TABS
100.0000 mg | ORAL_TABLET | Freq: Every evening | ORAL | Status: DC | PRN
  Administered 2017-09-12: 100 mg via ORAL
  Filled 2017-09-11 (×3): qty 1

## 2017-09-11 MED ORDER — ALUM & MAG HYDROXIDE-SIMETH 200-200-20 MG/5ML PO SUSP
30.0000 mL | ORAL | Status: DC | PRN
  Administered 2017-09-13 – 2017-09-14 (×2): 30 mL via ORAL
  Filled 2017-09-11 (×2): qty 30

## 2017-09-11 MED ORDER — TRAZODONE HCL 100 MG PO TABS: 100.0000 mg | ORAL_TABLET | Freq: Every evening | ORAL | Status: DC | PRN

## 2017-09-11 MED ORDER — MAGNESIUM HYDROXIDE 400 MG/5ML PO SUSP
30.0000 mL | Freq: Every day | ORAL | Status: DC | PRN
  Administered 2017-09-14: 30 mL via ORAL
  Filled 2017-09-11: qty 30

## 2017-09-11 MED ORDER — VITAMIN B-1 100 MG PO TABS
100.0000 mg | ORAL_TABLET | Freq: Every day | ORAL | Status: DC
  Administered 2017-09-11: 100 mg via ORAL
  Filled 2017-09-11: qty 1

## 2017-09-11 MED ORDER — CLONIDINE HCL 0.1 MG PO TABS: 0.1000 mg | ORAL_TABLET | Freq: Every day | ORAL | Status: DC

## 2017-09-11 MED ORDER — THIAMINE HCL 100 MG/ML IJ SOLN: 100.0000 mg | Freq: Every day | INTRAMUSCULAR | Status: DC

## 2017-09-11 MED ORDER — HYDROXYZINE HCL 25 MG PO TABS
25.0000 mg | ORAL_TABLET | Freq: Four times a day (QID) | ORAL | Status: DC | PRN
  Administered 2017-09-11: 25 mg via ORAL
  Filled 2017-09-11: qty 1

## 2017-09-11 MED ORDER — LORAZEPAM 0.5 MG PO TABS
0.5000 mg | ORAL_TABLET | Freq: Once | ORAL | Status: AC
  Administered 2017-09-11: 0.5 mg via ORAL
  Filled 2017-09-11: qty 1

## 2017-09-11 MED ORDER — NAPROXEN 250 MG PO TABS
500.0000 mg | ORAL_TABLET | Freq: Two times a day (BID) | ORAL | Status: DC | PRN
  Administered 2017-09-11: 500 mg via ORAL
  Filled 2017-09-11: qty 2

## 2017-09-11 MED ORDER — ARIPIPRAZOLE 5 MG PO TABS
5.0000 mg | ORAL_TABLET | Freq: Every day | ORAL | Status: DC
  Administered 2017-09-12: 5 mg via ORAL
  Filled 2017-09-11 (×3): qty 1

## 2017-09-11 MED ORDER — METRONIDAZOLE 500 MG PO TABS
500.0000 mg | ORAL_TABLET | Freq: Two times a day (BID) | ORAL | Status: DC
  Administered 2017-09-11: 500 mg via ORAL
  Filled 2017-09-11: qty 1

## 2017-09-11 MED ORDER — LORAZEPAM 1 MG PO TABS: 0.0000 mg | ORAL_TABLET | Freq: Four times a day (QID) | ORAL | Status: DC

## 2017-09-11 MED ORDER — CLONIDINE HCL 0.1 MG PO TABS
0.1000 mg | ORAL_TABLET | Freq: Four times a day (QID) | ORAL | Status: DC
  Administered 2017-09-12 (×4): 0.1 mg via ORAL
  Filled 2017-09-11 (×11): qty 1

## 2017-09-11 MED ORDER — METHOCARBAMOL 500 MG PO TABS: 500.0000 mg | ORAL_TABLET | Freq: Three times a day (TID) | ORAL | Status: DC | PRN

## 2017-09-11 MED ORDER — NAPROXEN 500 MG PO TABS
500.0000 mg | ORAL_TABLET | Freq: Two times a day (BID) | ORAL | Status: DC | PRN
  Administered 2017-09-12: 500 mg via ORAL
  Filled 2017-09-11: qty 1

## 2017-09-11 MED ORDER — ONDANSETRON 4 MG PO TBDP: 4.0000 mg | ORAL_TABLET | Freq: Four times a day (QID) | ORAL | Status: DC | PRN

## 2017-09-11 MED ORDER — HYDROXYZINE HCL 25 MG PO TABS
25.0000 mg | ORAL_TABLET | Freq: Four times a day (QID) | ORAL | Status: DC | PRN
  Administered 2017-09-12 – 2017-09-13 (×3): 25 mg via ORAL
  Filled 2017-09-11 (×2): qty 1

## 2017-09-11 MED ORDER — LORAZEPAM 2 MG/ML IJ SOLN: 0.0000 mg | Freq: Four times a day (QID) | INTRAMUSCULAR | Status: DC

## 2017-09-11 MED ORDER — LORAZEPAM 2 MG/ML IJ SOLN: 0.0000 mg | Freq: Two times a day (BID) | INTRAMUSCULAR | Status: DC

## 2017-09-11 NOTE — ED Notes (Signed)
Vitals charted at 2010 charted in error on incorrect chart-Monique,RN

## 2017-09-11 NOTE — ED Triage Notes (Signed)
Onset 4-5 weeks pt has been snorting heroin, taking Percocets, drinking alcohol.  Pt has 3 year that lives in household fulltime, and shares custody with 2 other children, lives with boyfriend. Pt has thoughts of hurting self, no plan.  States "I feel like they would be better off without me".

## 2017-09-11 NOTE — ED Notes (Signed)
Informed Charge Nurse pt need to go back next WBC 19.6 and blood pressure 85/56.

## 2017-09-11 NOTE — ED Provider Notes (Signed)
[facility] Provider Note   CSN: [id number] Arrival date & time: 09/11/17  1155     History   Chief Complaint Chief Complaint  Patient presents with  . Suicidal    HPI [name] is a 32 y.o. female  Presents today for evaluation of 2 complaints. 1 suicidal ideation. Patient reports that she has relapsed over the past year back into drinking and smoking marijuana.  She says that for the past 3 to 6 weeks she has been snorting heroin, taking Percocets, and drinking alcohol.  She has a 34-year-old who lives with her full-time and she shares custody of 2 of her older children with her father.  She reports that she has not done anything to intentionally hurt herself and does not have a plan.  She does say that in 2014 when she was in prison on an impulse she attempted to hang herself.  She denies any true planning going into that but says that currently she is feeling the way that she did leading up to that attempt.  She denies injecting heroin. She reports feeling hopeless, worthless, useless.  She acknowledges that she is self isolating to use drugs and substances.  She feels like she has harming her child and that she is still providing for the basic needs of the child however is not doing anything more.  She reports that currently her child is with a friend in a safe place. She denies HI, AVH.  She does report being paranoid stating that she is always over thinking everything.  She reports that she feels like she is currently manic, that she will go periods where she does not sleep for days and then will sleep excessively.    2.  Vaginal drainage.  Patient reports that for the past few months she has been having foul vaginal odors.  She has seen her PCP twice for this and he gives her a one-time pill, she does not know what pill this is, however it does not fully go away.  She denies any other illness symptoms.  Has been nauseous and vomiting,  however she is not nauseous currently however she has attributed that to her drug use.  No abdominal pain, cough, or chest pain.  She denies dysuria, hematuria. On arrival patient's heart rate was 94 and BP 85/68, therefore she was moved to the acute side for evaluation.      HPI  Past Medical History:  Diagnosis Date  . Drug addiction in remission (HCC)   . SVD (spontaneous vaginal delivery) 04/17/2014    Patient Active Problem List   Diagnosis Date Noted  . Indication for care in labor or delivery 04/17/2014  . SVD (spontaneous vaginal delivery) 04/17/2014    Past Surgical History:  Procedure Laterality Date  . BREAST SURGERY     reduction     OB History    Gravida  3   Para  3   Term  3   Preterm      AB      Living  1     SAB      TAB      Ectopic      Multiple  0   Live Births  1            Home Medications    Prior to Admission medications   Medication Sig Start Date End Date Taking? Authorizing Provider  etonogestrel (NEXPLANON) 68 MG IMPL implant 1 each by  Subdermal route once. Implanted May 2016   Yes [provider]  ibuprofen (ADVIL,MOTRIN) 200 MG tablet Take 600 mg by mouth every 6 (six) hours as needed for headache (pain).   Yes [provider]  ibuprofen (ADVIL,MOTRIN) 600 MG tablet Take 1 tablet (600 mg total) by mouth every 6 (six) hours. Patient not taking: Reported on 12/12/2014 04/19/14   Lavina Hamman, MD    Family History History reviewed. No pertinent family history.  Social History Social History   Tobacco Use  . Smoking status: Current Every Day Smoker    Packs/day: 0.00    Types: Cigarettes  . Smokeless tobacco: Never Used  Substance Use Topics  . Alcohol use: No  . Drug use: No    Comment: Former drug user     Allergies   Penicillins   Review of Systems Review of Systems  Constitutional: Negative for chills and fever.  HENT: Negative for ear pain and sore throat.   Eyes: Negative for  pain and visual disturbance.  Respiratory: Negative for cough and shortness of breath.   Cardiovascular: Negative for chest pain and palpitations.  Gastrointestinal: Positive for nausea and vomiting. Negative for abdominal pain, constipation and diarrhea.  Genitourinary: Negative for dysuria and hematuria.  Musculoskeletal: Negative for arthralgias and back pain.  Skin: Negative for color change and rash.  Neurological: Negative for seizures and syncope.  Psychiatric/Behavioral: Positive for behavioral problems and dysphoric mood. The patient is nervous/anxious.   All other systems reviewed and are negative.    Physical Exam Updated Vital Signs BP 105/71 (BP Location: Left Arm)   Pulse 61   Temp 97.6 F (36.4 C) (Oral)   Resp 18   Ht 5\' 5"  (1.651 m)   Wt 49.9 kg   LMP 09/09/2017   SpO2 100%   BMI 18.30 kg/m   Physical Exam  Constitutional: She appears well-developed and well-nourished. No distress.  HENT:  Head: Normocephalic and atraumatic.  Mouth/Throat: Oropharynx is clear and moist.  Eyes: Conjunctivae are normal. Right eye exhibits no discharge. Left eye exhibits no discharge. No scleral icterus.  Neck: Normal range of motion.  Cardiovascular: Normal rate, regular rhythm and normal heart sounds.  Pulmonary/Chest: Effort normal and breath sounds normal. No stridor. No respiratory distress.  Abdominal: Soft. Bowel sounds are normal. She exhibits no distension. There is no tenderness. There is no guarding.  Musculoskeletal: She exhibits no edema or deformity.  Neurological: She is alert. She exhibits normal muscle tone.  Skin: Skin is warm and dry. She is not diaphoretic.  Psychiatric: Her speech is normal. Her mood appears anxious. She is withdrawn. Cognition and memory are normal. She expresses impulsivity. She expresses suicidal ideation. She expresses no homicidal ideation. She expresses no suicidal plans and no homicidal plans.  tearful  Nursing note and vitals  reviewed.    ED Treatments / Results  Labs (all labs ordered are listed, but only abnormal results are displayed) Labs Reviewed  WET PREP, GENITAL - Abnormal; Notable for the following components:      Result Value   Clue Cells Wet Prep HPF POC PRESENT (*)    WBC, Wet Prep HPF POC RARE (*)    All other components within normal limits  ACETAMINOPHEN LEVEL - Abnormal; Notable for the following components:   Acetaminophen (Tylenol), Serum <10 (*)    All other components within normal limits  CBC - Abnormal; Notable for the following components:   WBC 19.6 (*)    RBC 3.71 (*)  MCV 104.6 (*)    MCH 34.5 (*)    All other components within normal limits  RAPID URINE DRUG SCREEN, HOSP PERFORMED - Abnormal; Notable for the following components:   Opiates POSITIVE (*)    Tetrahydrocannabinol POSITIVE (*)    All other components within normal limits  URINALYSIS, ROUTINE W REFLEX MICROSCOPIC - Abnormal; Notable for the following components:   APPearance HAZY (*)    Protein, ur 30 (*)    All other components within normal limits  COMPREHENSIVE METABOLIC PANEL  ETHANOL  SALICYLATE LEVEL  RPR  HIV ANTIBODY (ROUTINE TESTING)  I-STAT BETA HCG BLOOD, ED ([facility], [facility], [facility] ONLY)  GC/CHLAMYDIA PROBE AMP ([place]) NOT AT [facility]    EKG None  Radiology No results found.  Procedures Procedures (including critical care time)  Medications Ordered in ED Medications  LORazepam (ATIVAN) tablet 0.5 mg (0.5 mg Oral Given 09/11/17 1909)     Initial Impression / Assessment and Plan / ED Course  I have reviewed the triage vital signs and the nursing notes.  Pertinent labs & imaging results that were available during my care of the patient were reviewed by me and considered in my medical decision making (see chart for details).  Clinical Course as of Sep 13 107  Wynelle LinkSun Sep 11, 2017  1845 Patient medically clear   [EH]    Clinical Course User Index [EH] Cristina GongHammond, Dazhane Villagomez W, PA-C   Kynisha  Rubye [name] presents today for evaluation of SI and vaginal discharge.  Regarding her SI she does not have a specific plan, she does report using alcohol along with snorting heroin.  Depressed, states he feels like she is only doing the bare minimum needed to get by.    She has previously attempted to kill herself by hanging herself while in prison.  TS was consulted who recommended inpatient placement.  She also has concerns of foul-smelling vaginal drainage.  This is been going on for multiple months.  She did not have any cervical motion tenderness, adnexal tenderness or fullness.  Wet prep revealed clue cells consistent with BV.  Ordered Flagyl 500 twice daily for 7 days which will need to be continued upon her discharge for the full 7 days.  Medically clear for psychiatric placement.   Final Clinical Impressions(s) / ED Diagnoses   Final diagnoses:  Suicidal ideation  Polysubstance abuse (HCC)  Alcohol abuse  Bacterial vaginosis    ED Discharge Orders    None       Norman ClayHammond, Amany Rando W, PA-C 09/12/17 0115    Tegeler, Canary Brimhristopher J, MD 09/12/17 1137
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
## 2017-09-11 NOTE — ED Notes (Addendum)
Patient belongings inventoried 2 bags placed in locker #1, NO valuables 1 pair of black headphones

## 2017-09-11 NOTE — ED Triage Notes (Signed)
Pt reports most recent drug used is heroine was 2000 Sat. Night. Pt also reports binge drinking up to a 1/5 a day. Pt also reports taking percocet but none recently. Pt reported she started using alcohol and drugs soon after her 6626yr old was  Born. Pt reports she is under a lot of stress with out a support system.

## 2017-09-12 ENCOUNTER — Encounter (HOSPITAL_COMMUNITY): Payer: Self-pay

## 2017-09-12 ENCOUNTER — Other Ambulatory Visit: Payer: Self-pay

## 2017-09-12 DIAGNOSIS — F315 Bipolar disorder, current episode depressed, severe, with psychotic features: Secondary | ICD-10-CM

## 2017-09-12 LAB — GC/CHLAMYDIA PROBE AMP (~~LOC~~) NOT AT ARMC
Chlamydia: NEGATIVE
Neisseria Gonorrhea: NEGATIVE

## 2017-09-12 LAB — RPR: RPR: NONREACTIVE

## 2017-09-12 MED ORDER — TRAZODONE HCL 100 MG PO TABS
ORAL_TABLET | ORAL | Status: AC
  Filled 2017-09-12: qty 1

## 2017-09-12 MED ORDER — ARIPIPRAZOLE 10 MG PO TABS
10.0000 mg | ORAL_TABLET | Freq: Every day | ORAL | Status: DC
  Administered 2017-09-13: 10 mg via ORAL
  Filled 2017-09-12 (×3): qty 1

## 2017-09-12 MED ORDER — ONDANSETRON 4 MG PO TBDP
ORAL_TABLET | ORAL | Status: AC
  Filled 2017-09-12: qty 1

## 2017-09-12 MED ORDER — METRONIDAZOLE 500 MG PO TABS
500.0000 mg | ORAL_TABLET | Freq: Two times a day (BID) | ORAL | Status: DC
  Administered 2017-09-12 – 2017-09-13 (×4): 500 mg via ORAL
  Filled 2017-09-12 (×7): qty 1

## 2017-09-12 MED ORDER — GABAPENTIN 300 MG PO CAPS
300.0000 mg | ORAL_CAPSULE | Freq: Three times a day (TID) | ORAL | Status: DC
  Administered 2017-09-12 – 2017-09-13 (×5): 300 mg via ORAL
  Filled 2017-09-12 (×9): qty 1

## 2017-09-12 MED ORDER — NICOTINE 21 MG/24HR TD PT24
21.0000 mg | MEDICATED_PATCH | Freq: Every day | TRANSDERMAL | Status: DC
  Administered 2017-09-12 – 2017-09-14 (×3): 21 mg via TRANSDERMAL
  Filled 2017-09-12 (×3): qty 1

## 2017-09-12 MED ORDER — HYDROXYZINE HCL 25 MG PO TABS
ORAL_TABLET | ORAL | Status: AC
  Filled 2017-09-12: qty 1

## 2017-09-12 MED ORDER — TRAZODONE HCL 100 MG PO TABS
100.0000 mg | ORAL_TABLET | Freq: Every evening | ORAL | Status: DC | PRN
  Administered 2017-09-13: 100 mg via ORAL

## 2017-09-12 MED ORDER — CLONIDINE HCL 0.1 MG PO TABS
ORAL_TABLET | ORAL | Status: AC
  Filled 2017-09-12: qty 1

## 2017-09-12 NOTE — [facility] Group Notes (Signed)
LCSW Group Therapy Note   09/12/2017 1:15pm   Type of Therapy and Topic:  Group Therapy:  Overcoming Obstacles   Participation Level:  Did Not Attend--pt invited. Chose to remain in bed.    Description of Group:    In this group patients will be encouraged to explore what they see as obstacles to their own wellness and recovery. They will be guided to discuss their thoughts, feelings, and behaviors related to these obstacles. The group will process together ways to cope with barriers, with attention given to specific choices patients can make. Each patient will be challenged to identify changes they are motivated to make in order to overcome their obstacles. This group will be process-oriented, with patients participating in exploration of their own experiences as well as giving and receiving support and challenge from other group members.   Therapeutic Goals: 1. Patient will identify personal and current obstacles as they relate to admission. 2. Patient will identify barriers that currently interfere with their wellness or overcoming obstacles.  3. Patient will identify feelings, thought process and behaviors related to these barriers. 4. Patient will identify two changes they are willing to make to overcome these obstacles:      Summary of Patient Progress   x   Therapeutic Modalities:   Cognitive Behavioral Therapy Solution Focused Therapy Motivational Interviewing Relapse Prevention Therapy  Nhia Heaphy S Chantay Whitelock, LCSW 09/12/2017 12:59 PM  

## 2017-09-12 NOTE — Tx Team (Signed)
Initial Treatment Plan 09/12/2017 12:59 AM [name] [id number]    PATIENT STRESSORS: Financial difficulties Substance Abuse   PATIENT STRENGTHS: Motivation to change communication   PATIENT IDENTIFIED PROBLEMS: depression  Substance abuse                   DISCHARGE CRITERIA:  Ability to meet basic life and health needs Improved stabilization in mood, thinking, and/or behavior Reduction of life-threatening or endangering symptoms to within safe limits Verbal commitment to aftercare and medication compliance  PRELIMINARY DISCHARGE PLAN: Outpatient treatment  PATIENT/FAMILY INVOLVEMENT: This treatment plan has been presented to and reviewed with the patient, [name].  The patient and family have been given the opportunity to ask questions and make suggestions.  Beth NestleAntoinekia  [name], RN 09/12/2017, 12:59 AM
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
## 2017-09-12 NOTE — B[facility] Suicide Risk Assessment (Signed)
[facility] INPATIENT:  Family/Significant Other Suicide Prevention Education  Suicide Prevention Education:  Patient Refusal for Family/Significant Other Suicide Prevention Education: The patient [name] has refused to provide written consent for family/significant other to be provided Family/Significant Other Suicide Prevention Education during admission and/or prior to discharge.  Physician notified.  SPE completed with pt, as pt refused to consent to family contact. SPI pamphlet provided to pt and pt was encouraged to share information with support network, ask questions, and talk about any concerns relating to SPE. Pt denies access to guns/firearms and verbalized understanding of information provided. Mobile Crisis information also provided to pt.   Rona RavensHeather S Etheline Geppert LCSW 09/12/2017, 10:53 AM
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
## 2017-09-12 NOTE — Progress Notes (Signed)
Pt reports with c/o relapse.  Pt reports "clean" for 6 weeks.  Pt reports iv use and snorting heroin.  Pt reports last usage was yesterday.  Pt reports generalized body pain 5/10.  Pt denies AH,VH,HI,SI at this time.  Pt has 34 yr old DTR with her friend.  Pt contracts for safety.  Q15 min checks in place.
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
## 2017-09-12 NOTE — Tx Team (Signed)
Interdisciplinary Treatment and Diagnostic Plan Update  09/12/2017 Time of Session: 0830AM [name] MRN: [id number]  Principal Diagnosis: Bipolar Disorder I  Secondary Diagnoses: Active Problems:   Bipolar 1 disorder (HCC)   Current Medications:  Current Facility-Administered Medications  Medication Dose Route Frequency Provider Last Rate Last Dose  . acetaminophen (TYLENOL) tablet 650 mg  650 mg Oral Q6H PRN Kerry HoughSimon, Spencer E, PA-C      . alum & mag hydroxide-simeth (MAALOX/MYLANTA) 200-200-20 MG/5ML suspension 30 mL  30 mL Oral Q4H PRN Donell SievertSimon, Spencer E, PA-C      . ARIPiprazole (ABILIFY) tablet 5 mg  5 mg Oral Daily Donell SievertSimon, Spencer E, PA-C   5 mg at 09/12/17 0751  . cloNIDine (CATAPRES) tablet 0.1 mg  0.1 mg Oral QID Kerry HoughSimon, Spencer E, PA-C   0.1 mg at 09/12/17 [id number]   Followed by  . [START ON 09/14/2017] cloNIDine (CATAPRES) tablet 0.1 mg  0.1 mg Oral BH-qamhs Simon, Spencer E, PA-C       Followed by  . [START ON 09/17/2017] cloNIDine (CATAPRES) tablet 0.1 mg  0.1 mg Oral QAC breakfast Kerry HoughSimon, Spencer E, PA-C      . dicyclomine (BENTYL) tablet 20 mg  20 mg Oral Q6H PRN Kerry HoughSimon, Spencer E, PA-C      . hydrOXYzine (ATARAX/VISTARIL) tablet 25 mg  25 mg Oral Q6H PRN Donell SievertSimon, Spencer E, PA-C   25 mg at 09/12/17 0005  . lamoTRIgine (LAMICTAL) tablet 25 mg  25 mg Oral Daily Kerry HoughSimon, Spencer E, PA-C   25 mg at 09/12/17 [id number]  . loperamide (IMODIUM) capsule 2-4 mg  2-4 mg Oral PRN Donell SievertSimon, Spencer E, PA-C      . magnesium hydroxide (MILK OF MAGNESIA) suspension 30 mL  30 mL Oral Daily PRN Kerry HoughSimon, Spencer E, PA-C      . methocarbamol (ROBAXIN) tablet 500 mg  500 mg Oral Q8H PRN Kerry HoughSimon, Spencer E, PA-C      . naproxen (NAPROSYN) tablet 500 mg  500 mg Oral BID PRN Kerry HoughSimon, Spencer E, PA-C      . ondansetron (ZOFRAN-ODT) disintegrating tablet 4 mg  4 mg Oral Q6H PRN Donell SievertSimon, Spencer E, PA-C   4 mg at 09/12/17 0005  . traZODone (DESYREL) tablet 100 mg  100 mg Oral QHS,MR X 1 Kerry HoughSimon, Spencer E, PA-C   100 mg at  09/12/17 0005   PTA Medications: Medications Prior to Admission  Medication Sig Dispense Refill Last Dose  . etonogestrel (NEXPLANON) 68 MG IMPL implant 1 each by Subdermal route once. Implanted May 2016   May 2016  . ibuprofen (ADVIL,MOTRIN) 200 MG tablet Take 600 mg by mouth every 6 (six) hours as needed for headache (pain).   09/11/2017 at early am  . ibuprofen (ADVIL,MOTRIN) 600 MG tablet Take 1 tablet (600 mg total) by mouth every 6 (six) hours. (Patient not taking: Reported on 12/12/2014) 30 tablet 0 Not Taking at Unknown time    Patient Stressors:    Patient Strengths:    Treatment Modalities: Medication Management, Group therapy, Case management,  1 to 1 session with clinician, Psychoeducation, Recreational therapy.   Physician Treatment Plan for Primary Diagnosis:  Bipolar Disorder I  Medication Management: Evaluate patient's response, side effects, and tolerance of medication regimen.  Therapeutic Interventions: 1 to 1 sessions, Unit Group sessions and Medication administration.  Evaluation of Outcomes: Progressing  Physician Treatment Plan for Secondary Diagnosis: Active Problems:   Bipolar 1 disorder (HCC)  Medication Management: Evaluate patient's response, side effects, and tolerance of  medication regimen.  Therapeutic Interventions: 1 to 1 sessions, Unit Group sessions and Medication administration.  Evaluation of Outcomes: Progressing   RN Treatment Plan for Primary Diagnosis:  Bipolar Disorder I Long Term Goal(s): Knowledge of disease and therapeutic regimen to maintain health will improve  Short Term Goals: Ability to remain free from injury will improve, Ability to demonstrate self-control, Ability to participate in decision making will improve and Ability to disclose and discuss suicidal ideas  Medication Management: RN will administer medications as ordered by provider, will assess and evaluate patient's response and provide education to patient for  prescribed medication. RN will report any adverse and/or side effects to prescribing provider.  Therapeutic Interventions: 1 on 1 counseling sessions, Psychoeducation, Medication administration, Evaluate responses to treatment, Monitor vital signs and CBGs as ordered, Perform/monitor CIWA, COWS, AIMS and Fall Risk screenings as ordered, Perform wound care treatments as ordered.  Evaluation of Outcomes: Progressing   LCSW Treatment Plan for Primary Diagnosis:  Bipolar Disorder I Long Term Goal(s): Safe transition to appropriate next level of care at discharge, Engage patient in therapeutic group addressing interpersonal concerns.  Short Term Goals: Engage patient in aftercare planning with referrals and resources, Facilitate patient progression through stages of change regarding substance use diagnoses and concerns and Identify triggers associated with mental health/substance abuse issues  Therapeutic Interventions: Assess for all discharge needs, 1 to 1 time with Social worker, Explore available resources and support systems, Assess for adequacy in community support network, Educate family and significant other(s) on suicide prevention, Complete Psychosocial Assessment, Interpersonal group therapy.  Evaluation of Outcomes: Progressing   Progress in Treatment: Attending groups: No.  New to unit. Continuing to assess Participating in groups: No. Taking medication as prescribed: Yes. Toleration medication: Yes. Family/Significant other contact made: SPE completed with pt; pt declined to consent to collateral contact.  Patient understands diagnosis: Yes. Discussing patient identified problems/goals with staff: Yes. Medical problems stabilized or resolved: Yes. Denies suicidal/homicidal ideation: Yes. Issues/concerns per patient self-inventory: No. Other: n/a   New problem(s) identified: No, Describe:  n/a  New Short Term/Long Term Goal(s): detox, medication management for mood  stabilization; elimination of SI thoughts; development of comprehensive mental wellness/sobriety plan.   Patient Goals:  "to detox and learn better coping skills. To stay in [place] and get linked with resources here."   Discharge Plan or Barriers: CSW assessing for appropriate referrals. Pt plans [place] stay with a friend in [place] at discharge and [facility] and [facility] for [facility] referral. MHAG pamphlet, Mobile Crisis information, and AA/NA information provided to patient for additional community support and resources.   Reason for Continuation of Hospitalization: Depression Medication stabilization Suicidal ideation Withdrawal symptoms  Estimated Length of Stay: Thursday, 09/15/17  Attendees: Patient: 09/12/2017 11:15 AM  Physician: Dr. Jola Babinskilary MD; Dr. Altamese Carolinaainville MD 09/12/2017 11:15 AM  Nursing: Rodell PernaPatrice RN; [facility] 09/12/2017 11:15 AM  RN Care Manager:x 09/12/2017 11:15 AM  Social Worker: Corrie MckusickHeather Carlisha Wisler LCSW 09/12/2017 11:15 AM  Recreational Therapist: x 09/12/2017 11:15 AM  Other: X 09/12/2017 11:15 AM  Other:  09/12/2017 11:15 AM  Other: 09/12/2017 11:15 AM    Scribe for Treatment Team: Rona RavensHeather S Alejandra Barna, LCSW 09/12/2017 11:15 AM

## 2017-09-12 NOTE — Progress Notes (Signed)
Recreation Therapy Notes  Date: 8.19.19 Time: 0930 Location: [address] Dayroom  Group Topic: Stress Management  Goal Area(s) Addresses:  Patient will verbalize importance of using healthy stress management.  Patient will identify positive emotions associated with healthy stress management.   Intervention: Stress Management  Activity :  Guided Imagery.  LRT introduced the stress management technique of guided imagery.  LRT read a script that allowed patients to visualize their peaceful place.  Patients were to follow along as script was read.  Education: Stress Management, Discharge Planning.   Education Outcome: Acknowledges edcuation/In group clarification offered/Needs additional education  Clinical Observations/Feedback: Pt did not attend group.     Caroll RancherMarjette Jini Horiuchi, LRT/CTRS     Caroll RancherLindsay, Les [place]more A 09/12/2017 12:26 PM

## 2017-09-12 NOTE — Progress Notes (Signed)
Patient ID: [name], female   DOB: 08/12/1985, 32 y.o.   MRN: [id number] D: Patient in bed all evening sleeping. Pt reports she has been feeling dizzy after medication given before dinner. Denies  SI/HI/AVH and pain.No behavioral issues noted.  A: Support and encouragement offered as needed. Medication held due to decreased B/P. Gatorade given and encourage to increase fluid intake.  R: Patient is safe and cooperative on unit. Will continue to monitor  for safety and stability.

## 2017-09-12 NOTE — Progress Notes (Signed)
Pt presents with a flat affect and a labile mood. On approach, pt noted to be increasingly anxious. Pt stated, "I feel like shit, I need to get out of here but I know I need help. I can't believe I did this to myself". Pt reported withdrawal symptoms of: anxiety, chills, sweats and body aches. Medications given for withdrawal symptoms per protocol.  Pt denies AVH. Pt denies SI/HI.   Orders reviewed with pt. Pt encouraged to increase fluid intake. COWs assessed as ordered. Verbal support provided. Pt encouraged to attend groups as tolerated. 15 minute checks performed for safety.  Pt compliant with tx plan.

## 2017-09-13 ENCOUNTER — Encounter (HOSPITAL_COMMUNITY): Payer: Self-pay

## 2017-09-13 LAB — HIV ANTIBODY (ROUTINE TESTING W REFLEX): HIV Screen 4th Generation wRfx: NONREACTIVE

## 2017-09-13 MED ORDER — BUSPIRONE HCL 10 MG PO TABS
10.0000 mg | ORAL_TABLET | Freq: Two times a day (BID) | ORAL | Status: DC
  Administered 2017-09-13: 10 mg via ORAL
  Filled 2017-09-13 (×4): qty 1
  Filled 2017-09-13: qty 2

## 2017-09-13 NOTE — Progress Notes (Addendum)
D: Patient denies SI, HI or AVH this evening. Patient presents as animated and anxious but pleasant and cooperative.  Pt. Spoke with her mother on the phone tonight and states they had a good conversation.  Pt. States that she believes her SI were the resulting of, "wanting to use so bad".  Pt states she is feeling better but "a little jittery" which she attributes to some of the new medication.  She denied any physical complaint except some epigastric discomfort after taking her scheduled flagyl.  Pt. Reports that she is anxious to see her 34 year old who is staying with a good friend, states the child asked the friend "can I call you mommy?" which had feeling sad.  A: Patient given emotional support from RN. Patient encouraged to come to staff with concerns and/or questions. Patient's medication routine continued. Patient's orders and plan of care reviewed.   R: Patient remains appropriate and cooperative. Will continue to monitor patient q15 minutes for safety.

## 2017-09-13 NOTE — Plan of Care (Signed)
Pt progressing in the following metrics  D: pt found in her room writing; compliant with medication administration. Pt states she slept well last night without the use of sleeping meds. Pt denied her clonidine this morning stating she thinks that is what is making her feel "bad". Pt rates her depression/hopelessness/anxiety a 3/0/8 out of 10 respectively. Pt denies any physical pain to this Clinical research associatewriter. Pt states her goal for today is to figure out her meds and psych appointments. The patient will achieve this by talking with the medical staff. Pt denies any si/hi/ah/vh and verbally agrees to approach staff if these become apparent.  A: pt provided support and encouragement. Pt given medication per protocol and standing orders. [id number] safety checks implemented and continued.  R: pt safe on the unit. Will continue to monitor.   Problem: Education: Goal: Knowledge of [facility] Education information/materials will improve Outcome: Progressing Goal: Emotional status will improve Outcome: Progressing Goal: Mental status will improve Outcome: Progressing Goal: Verbalization of understanding the information provided will improve Outcome: Progressing   Problem: Activity: Goal: Sleeping patterns will improve Outcome: Progressing   Problem: Coping: Goal: Ability to verbalize frustrations and anger appropriately will improve Outcome: Progressing Goal: Ability to demonstrate self-control will improve Outcome: Progressing

## 2017-09-13 NOTE — [facility] Group Notes (Signed)
[facility] Group Therapy 09/13/2017 1:15pm  Type of Therapy: Mental [employer] Presentation  Participation Level: Active  Participation Quality: Attentive  Affect: Appropriate  Cognitive: Oriented  Insight: Developing/Improving  Engagement in Therapy: Engaged  Modes of Intervention: Discussion, Education and Socialization  Summary of Progress/Problems: Mental [employer] Association (MHA) Speaker came to talk about his personal journey with mental health. The pt processed ways by which to relate to the speaker. MHA speaker provided handouts and educational information pertaining to groups and services offered by the MHA. Pt was engaged in speaker's presentation and was receptive to resources provided.    Casey Fye S Atara Paterson, LCSW 09/13/2017 3:34 PM  

## 2017-09-13 NOTE — Progress Notes (Signed)
Recreation Therapy Notes  Animal-Assisted Activity (AAA) Program Checklist/Progress Notes Patient Eligibility Criteria Checklist & Daily Group note for Rec TxIntervention  Date: 8.20.19 Time: 1430 Location: [address] Dayroom   AAA/T Program Assumption of Risk Form signed by Patient/ or Parent Legal Guardian YES   Patient is free of allergies or sever asthma YES   Patient reports no fear of animals  YES  Patient reports no history of cruelty to animals YES   Patient understands his/her participation is voluntary YES  Patient washes hands before animal contact YES   Patient washes hands after animal contact YES   Behavioral Response: Engaged  Education:Hand Washing, Appropriate Animal Interaction   Education Outcome: Acknowledges understanding/In group clarification offered/Needs additional education.   Clinical Observations/Feedback: Pt did not attend group activity.   [name], LRT/CTRS         [name] 09/13/2017 3:44 PM 

## 2017-09-13 NOTE — Progress Notes (Signed)
[facility] MD Progress Note  0/48/8891 69:45 AM [name]  MRN:  [id number]  Subjective: [name] reports, "I'm doing well this morning. I'm not having any withdrawal symptoms. I feel good. I do not want to take that Clonidine any more. It messed my blood pressure up yesterday. The Abilify & the Neurontin are helping. The anxiety has always been there. I would really like something for anxiety, Ativan or something. I'm also beginning to think that I'm about ready to get discharged from her. I have my plan in place for after discharge. I will be staying with my sponsor right after discharge, then, will find an [place] to move in with my daughter later on".  [name] is a 32 y/o F with history of bipolar disorder, PTSD, and polysubstance abuse who was admitted voluntarily from [facility] where she presented with worsening depression, anxiety, and relapse of heroin. She was medically cleared and then transferred to [facility] for additional treatment and stabilization. She was started on trial of abilify and lamictal as well as the COWS protocol with clonidine for treatment of withdrawal from opiates. Upon initial interview, pt shares, "I have been mentally struggling for most of my life. I relapsed. I had some clean time before this, but I've just been so stressed out." Pt cites stressors of her husband having a heart attack and surgery 6 weeks ago. She also cites unemployment and financial stressors. She notes about 4 weeks ago she felt overwhelmed and had relapse of use of heroin, which she has been using about $20/day via insufflation. She notes feeling anxious, paranoid that someone would hurt her child, and she has been isolating.  [name] is seen, chart reviewed. The chart findings discussed with the treatment team. She presents alert, oriented & aware of situation. She is visible on the unit, attending & participating in the group sessions. She reports feeling a lot better. She has asked for her  Clonidine to be discontinued as it made her blood pressure drop yesterday. She denies any substance withdrawal symptoms, but, continues to endorse high anxiety levels. Declines an increase in the dose of Vistaril, says it dries her out. Has requested Ativan. This was declined, however, patient is agreeable to start Buspar 10 mg po bid. She is tolerating her other medications. Says her sleep & appetite has improved. She currently denies any SIHI, AVH, delusional thoughts or paranoia. She does not appear to be responding to any internal stimuli.  Principal Problem: Bipolar I disorder, current or most recent episode depressed, with psychotic features [facility])  Diagnosis:   Patient Active Problem List   Diagnosis Date Noted  . Bipolar I disorder, current or most recent episode depressed, with psychotic features ([name]) [F31.5] 09/11/2017  . Indication for care in labor or delivery [O75.9] 04/17/2014  . SVD (spontaneous vaginal delivery) [O80] 04/17/2014   Total Time spent with patient: 25 minutes  Past Psychiatric History: Bipolar 1 disorder.  Past Medical History:  Past Medical History:  Diagnosis Date  . Depression   . Drug addiction in remission ([place])   . SVD (spontaneous vaginal delivery) 04/17/2014    Past Surgical History:  Procedure Laterality Date  . BREAST SURGERY     reduction   Family History: History reviewed. No pertinent family history.  Family Psychiatric  History: See H&P.  Social History:  Social History   Substance and Sexual Activity  Alcohol Use No     Social History   Substance and Sexual Activity  Drug Use Yes  .  Types: Heroin   Comment: Former drug user    Social History   Socioeconomic History  . Marital status: Significant Other    Spouse name: Not on file  . Number of children: Not on file  . Years of education: Not on file  . Highest education level: Not on file  Occupational History  . Not on file  Social Needs  . Financial resource strain:  Very hard  . Food insecurity:    Worry: Never true    Inability: Never true  . Transportation needs:    Medical: No    Non-medical: No  Tobacco Use  . Smoking status: Current Every Day Smoker    Packs/day: 2.00    Years: 10.00    Pack years: 20.00    Types: Cigarettes  . Smokeless tobacco: Never Used  Substance and Sexual Activity  . Alcohol use: No  . Drug use: Yes    Types: Heroin    Comment: Former drug user  . Sexual activity: Yes  Lifestyle  . Physical activity:    Days per week: 0 days    Minutes per session: 0 min  . Stress: Very much  Relationships  . Social connections:    Talks on phone: Not on file    Gets together: Not on file    Attends religious service: Not on file    Active member of club or organization: Not on file    Attends meetings of clubs or organizations: Not on file    Relationship status: Not on file  Other Topics Concern  . Not on file  Social History Narrative  . Not on file   Additional Social History:  Pain Medications: (ptreports past use of percocets) History of alcohol / drug use?: Yes Negative Consequences of Use: Financial Withdrawal Symptoms: Agitation Name of Substance 1: heroin snorting 1 - Age of First Use: 21 1 - Amount (size/oz): 8-20 bags 1 - Duration: 6 weeks  Sleep: Good  Appetite:  Good  Current Medications: Current Facility-Administered Medications  Medication Dose Route Frequency Provider Last Rate Last Dose  . acetaminophen (TYLENOL) tablet 650 mg  650 mg Oral Q6H PRN Laverle Hobby, PA-C      . alum & mag hydroxide-simeth (MAALOX/MYLANTA) 200-200-20 MG/5ML suspension 30 mL  30 mL Oral Q4H PRN Patriciaann Clan E, PA-C      . ARIPiprazole (ABILIFY) tablet 10 mg  10 mg Oral Daily Pennelope Bracken, MD   10 mg at 09/13/17 0733  . cloNIDine (CATAPRES) tablet 0.1 mg  0.1 mg Oral QID Patriciaann Clan E, PA-C   0.1 mg at 09/12/17 1651   Followed by  . [START ON 09/14/2017] cloNIDine (CATAPRES) tablet 0.1 mg  0.1  mg Oral BH-qamhs Simon, Spencer E, PA-C       Followed by  . [START ON 09/17/2017] cloNIDine (CATAPRES) tablet 0.1 mg  0.1 mg Oral QAC breakfast Laverle Hobby, PA-C      . dicyclomine (BENTYL) tablet 20 mg  20 mg Oral Q6H PRN Patriciaann Clan E, PA-C      . gabapentin (NEURONTIN) capsule 300 mg  300 mg Oral TID Maris Berger T, MD   300 mg at 09/13/17 0732  . hydrOXYzine (ATARAX/VISTARIL) tablet 25 mg  25 mg Oral Q6H PRN Patriciaann Clan E, PA-C   25 mg at 09/12/17 1204  . lamoTRIgine (LAMICTAL) tablet 25 mg  25 mg Oral Daily Laverle Hobby, PA-C   25 mg at 09/13/17 9833  .  loperamide (IMODIUM) capsule 2-4 mg  2-4 mg Oral PRN Patriciaann Clan E, PA-C      . magnesium hydroxide (MILK OF MAGNESIA) suspension 30 mL  30 mL Oral Daily PRN Patriciaann Clan E, PA-C      . methocarbamol (ROBAXIN) tablet 500 mg  500 mg Oral Q8H PRN Patriciaann Clan E, PA-C      . metroNIDAZOLE (FLAGYL) tablet 500 mg  500 mg Oral Q12H Pennelope Bracken, MD   500 mg at 09/13/17 1194  . naproxen (NAPROSYN) tablet 500 mg  500 mg Oral BID PRN Laverle Hobby, PA-C   500 mg at 09/12/17 1204  . nicotine (NICODERM CQ - dosed in mg/24 hours) patch 21 mg  21 mg Transdermal Daily Cobos, Myer Peer, MD   21 mg at 09/13/17 0731  . ondansetron (ZOFRAN-ODT) disintegrating tablet 4 mg  4 mg Oral Q6H PRN Laverle Hobby, PA-C   4 mg at 09/12/17 0005  . traZODone (DESYREL) tablet 100 mg  100 mg Oral QHS PRN,MR X 1 [place], Randa Ngo, MD       Lab Results:  Results for orders placed or performed during the hospital encounter of 09/11/17 (from the past 48 hour(s))  I-Stat beta hCG blood, ED     Status: None   Collection Time: 09/11/17 12:23 PM  Result Value Ref Range   I-stat hCG, quantitative <5.0 <5 mIU/mL   Comment 3            Comment:   GEST. AGE      CONC.  (mIU/mL)   <=1 WEEK        5 - 50     2 WEEKS       50 - 500     3 WEEKS       100 - 10,000     4 WEEKS     1,000 - 30,000        FEMALE AND NON-PREGNANT  FEMALE:     LESS THAN 5 mIU/mL   Rapid urine drug screen (hospital performed)     Status: Abnormal   Collection Time: 09/11/17 12:30 PM  Result Value Ref Range   Opiates POSITIVE (A) NONE DETECTED   Cocaine NONE DETECTED NONE DETECTED   Benzodiazepines NONE DETECTED NONE DETECTED   Amphetamines NONE DETECTED NONE DETECTED   Tetrahydrocannabinol POSITIVE (A) NONE DETECTED   Barbiturates NONE DETECTED NONE DETECTED    Comment: (NOTE) DRUG SCREEN FOR MEDICAL PURPOSES ONLY.  IF CONFIRMATION IS NEEDED FOR ANY PURPOSE, NOTIFY LAB WITHIN 5 DAYS. LOWEST DETECTABLE LIMITS FOR URINE DRUG SCREEN Drug Class                     Cutoff (ng/mL) Amphetamine and metabolites    1000 Barbiturate and metabolites    200 Benzodiazepine                 174 Tricyclics and metabolites     300 Opiates and metabolites        300 Cocaine and metabolites        300 THC                            50 Performed at [facility], [place] [address], [place], [place] [address]   Urinalysis, Routine w reflex microscopic     Status: Abnormal   Collection Time: 09/11/17 12:30 PM  Result Value Ref Range  Color, Urine YELLOW YELLOW   APPearance HAZY (A) CLEAR   Specific Gravity, Urine 1.026 1.005 - 1.030   pH 7.0 5.0 - 8.0   Glucose, UA NEGATIVE NEGATIVE mg/dL   Hgb urine dipstick NEGATIVE NEGATIVE   Bilirubin Urine NEGATIVE NEGATIVE   Ketones, ur NEGATIVE NEGATIVE mg/dL   Protein, ur 30 (A) NEGATIVE mg/dL   Nitrite NEGATIVE NEGATIVE   Leukocytes, UA NEGATIVE NEGATIVE   RBC / HPF 0-5 0 - 5 RBC/hpf   WBC, UA 0-5 0 - 5 WBC/hpf   Bacteria, UA NONE SEEN NONE SEEN   Squamous Epithelial / LPF 0-5 0 - 5   Mucus PRESENT     Comment: Performed at [facility] [address] [place], [place] [address]  Comprehensive metabolic panel     Status: None   Collection Time: 09/11/17 12:34 PM  Result Value Ref Range   Sodium 140 135 - 145 mmol/L   Potassium 4.7 3.5 - 5.1 mmol/L   Chloride 106 98 - 111 mmol/L    CO2 26 22 - 32 mmol/L   Glucose, Bld 85 70 - 99 mg/dL   BUN 12 6 - 20 mg/dL   Creatinine, Ser 0.99 0.44 - 1.00 mg/dL   Calcium 9.1 8.9 - 10.3 mg/dL   Total Protein 6.9 6.5 - 8.1 g/dL   Albumin 3.9 3.5 - 5.0 g/dL   AST 15 15 - 41 U/L   ALT 9 0 - 44 U/L   Alkaline Phosphatase 53 38 - 126 U/L   Total Bilirubin 0.6 0.3 - 1.2 mg/dL   GFR calc non Af Amer >60 >60 mL/min   GFR calc Af Amer >60 >60 mL/min    Comment: (NOTE) The eGFR has been calculated using the CKD EPI equation. This calculation has not been validated in all clinical situations. eGFR's persistently <60 mL/min signify possible Chronic Kidney Disease.    Anion gap 8 5 - 15    Comment: Performed at [facility] [address] [place], [place] [address]  cbc     Status: Abnormal   Collection Time: 09/11/17 12:34 PM  Result Value Ref Range   WBC 19.6 (H) 4.0 - 10.5 K/uL   RBC 3.71 (L) 3.87 - 5.11 MIL/uL   Hemoglobin 12.8 12.0 - 15.0 g/dL   HCT 38.8 36.0 - 46.0 %   MCV 104.6 (H) 78.0 - 100.0 fL   MCH 34.5 (H) 26.0 - 34.0 pg   MCHC 33.0 30.0 - 36.0 g/dL   RDW 12.7 11.5 - 15.5 %   Platelets 291 150 - 400 K/uL    Comment: Performed at [facility] [address] [place], [place] [address]  Ethanol     Status: None   Collection Time: 09/11/17 12:35 PM  Result Value Ref Range   Alcohol, Ethyl (B) <10 <10 mg/dL    Comment: (NOTE) Lowest detectable limit for serum alcohol is 10 mg/dL. For medical purposes only. Performed at [facility], [place] [address], [place], [place] [address]   Salicylate level     Status: None   Collection Time: 09/11/17 12:35 PM  Result Value Ref Range   Salicylate Lvl <6.1 2.8 - 30.0 mg/dL    Comment: Performed at [facility] [address], [place], [place] [address]  Acetaminophen level     Status: Abnormal   Collection Time: 09/11/17 12:35 PM  Result Value Ref Range   Acetaminophen (Tylenol), Serum <10 (L) 10 -  30 ug/mL    Comment: (NOTE) Therapeutic  concentrations vary significantly. A range of 10-30 ug/mL  may be an effective concentration for many patients. However, some  are best treated at concentrations outside of this range. Acetaminophen concentrations >150 ug/mL at 4 hours after ingestion  and >50 ug/mL at 12 hours after ingestion are often associated with  toxic reactions. Performed at [facility], [place] [address] [place], [place] [address]   RPR     Status: None   Collection Time: 09/11/17  4:05 PM  Result Value Ref Range   RPR Ser Ql Non Reactive Non Reactive    Comment: (NOTE) Performed At: [facility] [place], [place] [id number] Rush Farmer MD UY:[id number]   HIV antibody     Status: None   Collection Time: 09/11/17  4:05 PM  Result Value Ref Range   HIV Screen 4th Generation wRfx Non Reactive Non Reactive    Comment: (NOTE) Performed At: [facility] [place], [place] [id number] Rush Farmer MD IR:[id number]   Wet prep, genital     Status: Abnormal   Collection Time: 09/11/17  6:10 PM  Result Value Ref Range   Yeast Wet Prep HPF POC NONE SEEN NONE SEEN   Trich, Wet Prep NONE SEEN NONE SEEN   Clue Cells Wet Prep HPF POC PRESENT (A) NONE SEEN   WBC, Wet Prep HPF POC RARE (A) NONE SEEN   Sperm NONE SEEN     Comment: Specimen diluted due to transport tube containing more than 1 ml of saline, interpret results with caution. Performed at [facility], [place] [address] [place], [place] [address]     Blood Alcohol level:  Lab Results  Component Value Date   ETH <10 10/93/2355   Metabolic Disorder Labs: No results found for: HGBA1C, MPG No results found for: PROLACTIN No results found for: CHOL, TRIG, HDL, CHOLHDL, VLDL, LDLCALC  Physical Findings: AIMS: Facial and Oral Movements Muscles of Facial Expression: None, normal Lips and Perioral Area: None, normal Jaw: None, normal Tongue: None, normal,Extremity Movements Upper (arms, wrists,  hands, fingers): None, normal Lower (legs, knees, ankles, toes): None, normal, Trunk Movements Neck, shoulders, hips: None, normal, Overall Severity Severity of abnormal movements (highest score from questions above): None, normal Incapacitation due to abnormal movements: None, normal Patient's awareness of abnormal movements (rate only patient's report): No Awareness, Dental Status Current problems with teeth and/or dentures?: No Does patient usually wear dentures?: No  CIWA:  CIWA-Ar Total: 7 COWS:  COWS Total Score: 3  Musculoskeletal: Strength & Muscle Tone: within normal limits Gait & Station: normal Patient leans: N/A  Psychiatric Specialty Exam: Physical Exam  Nursing note and vitals reviewed.   Review of Systems  Psychiatric/Behavioral: Positive for substance abuse (Hx. opioid use disorder). Negative for depression, hallucinations, memory loss and suicidal ideas. The patient is nervous/anxious. The patient does not have insomnia.     Blood pressure (!) 110/94, pulse 82, temperature 98.1 F (36.7 C), temperature source Oral, resp. rate 16, height 5' 4" (1.626 m), weight 58.1 kg, last menstrual period 09/09/2017, SpO2 100 %, currently breastfeeding.Body mass index is 21.97 kg/m.  General Appearance: Casual and Fairly Groomed  Eye Contact:  Good  Speech:  Clear and Coherent and Normal Rate  Volume:  Normal  Mood:  Anxious and Depressed  Affect:  Appropriate, Congruent and Constricted  Thought Process:  Coherent and Goal Directed  Orientation:  Full (Time, Place, and Person)  Thought Content:  Logical  Suicidal Thoughts:  No  Homicidal Thoughts:  No  Memory:  Immediate;   Fair Recent;   Fair Remote;   Fair  Judgement:  Poor  Insight:  Lacking  Psychomotor Activity:  Normal  Concentration:  Concentration: Fair  Recall:  [employer] of Knowledge:  Fair  Language:  Fair  Akathisia:  No  Handed:    AIMS (if indicated):     Assets:  Resilience Social Support  ADL's:   Intact  Cognition:  WNL  Sleep:  Number of Hours: 6.5     Treatment Plan Summary: Daily contact with patient to assess and evaluate symptoms and progress in treatment.  - Continue inpatient hospitalization.  - Will continue today 09/13/2017 plan as below except where it is noted.  Opioid detox.     - Discontinued the Clonidine, patient declines to be on this medicine any longer. Denies any opioid withdrawal symptoms.  Mood control.     - Continue Abilify 10 mg po daily.  Mood stabilization.      - Continue Lamictal 25 mg po daily.  Agitation/anxiety.     - Continue Gabapentin 300 mg po tid.     - Initiate Buspar 10 mg po bid.     - Continue Vistaril 25 mg po Q 6 hours prn.  Insomnia.      - Continue Trazodone 100 mg po Q hs prn, may repeat.  Other medical complaints.       - Continue Flagyl 500 mg po bid x 7 days for yeast infection.  - Patient to attend & participate in the group milieu.  - The discharge disposition plan is ongoing.  Lindell Spar, NP, PMHNP, FNP-BC. 09/13/2017, 11:35 AM

## 2017-09-14 ENCOUNTER — Encounter (HOSPITAL_COMMUNITY): Payer: Self-pay | Admitting: Emergency Medicine

## 2017-09-14 ENCOUNTER — Other Ambulatory Visit: Payer: Self-pay

## 2017-09-14 ENCOUNTER — Emergency Department (HOSPITAL_COMMUNITY)
Admission: EM | Admit: 2017-09-14 | Discharge: 2017-09-14 | Disposition: A | Discharge status: Home / Self Care | Payer: Medicaid Other | Attending: Emergency Medicine | Admitting: Emergency Medicine

## 2017-09-14 DIAGNOSIS — R1110 Vomiting, unspecified: Secondary | ICD-10-CM | POA: Insufficient documentation

## 2017-09-14 DIAGNOSIS — Z5321 Procedure and treatment not carried out due to patient leaving prior to being seen by health care provider: Secondary | ICD-10-CM | POA: Insufficient documentation

## 2017-09-14 MED ORDER — TRAZODONE HCL 100 MG PO TABS: 100.0000 mg | ORAL_TABLET | Freq: Every evening | ORAL | 0 refills | Status: DC | PRN

## 2017-09-14 MED ORDER — LAMOTRIGINE 25 MG PO TABS: 25.0000 mg | ORAL_TABLET | Freq: Every day | ORAL | 0 refills | Status: DC

## 2017-09-14 MED ORDER — ARIPIPRAZOLE 10 MG PO TABS: 10.0000 mg | ORAL_TABLET | Freq: Every day | ORAL | 0 refills | Status: DC

## 2017-09-14 MED ORDER — BUSPIRONE HCL 10 MG PO TABS
10.0000 mg | ORAL_TABLET | Freq: Two times a day (BID) | ORAL | 0 refills | Status: DC
Start: 2017-09-14 — End: 2019-08-03

## 2017-09-14 MED ORDER — HYDROXYZINE HCL 25 MG PO TABS: 25.0000 mg | ORAL_TABLET | Freq: Four times a day (QID) | ORAL | 0 refills | Status: DC | PRN

## 2017-09-14 MED ORDER — NICOTINE 21 MG/24HR TD PT24: 21.0000 mg | MEDICATED_PATCH | Freq: Every day | TRANSDERMAL | 0 refills | Status: DC

## 2017-09-14 MED ORDER — GABAPENTIN 300 MG PO CAPS: 300.0000 mg | ORAL_CAPSULE | Freq: Three times a day (TID) | ORAL | 0 refills | Status: DC

## 2017-09-14 MED ORDER — METRONIDAZOLE 500 MG PO TABS: 500.0000 mg | ORAL_TABLET | Freq: Two times a day (BID) | ORAL | 0 refills | Status: DC

## 2017-09-14 NOTE — Discharge Summary (Addendum)
Physician Discharge Summary Note  Patient:  [name] is an 32 y.o., female  MRN:  [id number]  DOB:  25-May-1985  Patient phone:  [phone] (home)   Patient address:   [address] [place] [address],   Total Time spent with patient: Greater than 30 minutes  Date of Admission:  09/11/2017  Date of Discharge: 09-14-17  Reason for Admission: Worsening depression, anxiety, and relapse of heroin.   Principal Problem: Bipolar I disorder, current or most recent episode depressed, with psychotic features [facility])  Discharge Diagnoses: Patient Active Problem List   Diagnosis Date Noted  . Bipolar I disorder, current or most recent episode depressed, with psychotic features (HCC) [F31.5] 09/11/2017  . Indication for care in labor or delivery [O75.9] 04/17/2014  . SVD (spontaneous vaginal delivery) [O80] 04/17/2014   Past Psychiatric History: Bipolar disorder, Opioid use disorder  Past Medical History:  Past Medical History:  Diagnosis Date  . Depression   . Drug addiction in remission (HCC)   . SVD (spontaneous vaginal delivery) 04/17/2014    Past Surgical History:  Procedure Laterality Date  . BREAST SURGERY     reduction   Family History: History reviewed. No pertinent family history.  Family Psychiatric  History: See H&P  Social History:  Social History   Substance and Sexual Activity  Alcohol Use No     Social History   Substance and Sexual Activity  Drug Use Yes  . Types: Heroin   Comment: Former drug user    Social History   Socioeconomic History  . Marital status: Significant Other    Spouse name: Not on file  . Number of children: Not on file  . Years of education: Not on file  . Highest education level: Not on file  Occupational History  . Not on file  Social Needs  . Financial resource strain: Very hard  . Food insecurity:    Worry: Never true    Inability: Never true  . Transportation needs:    Medical: No   Non-medical: No  Tobacco Use  . Smoking status: Current Every Day Smoker    Packs/day: 2.00    Years: 10.00    Pack years: 20.00    Types: Cigarettes  . Smokeless tobacco: Never Used  Substance and Sexual Activity  . Alcohol use: No  . Drug use: Yes    Types: Heroin    Comment: Former drug user  . Sexual activity: Yes  Lifestyle  . Physical activity:    Days per week: 0 days    Minutes per session: 0 min  . Stress: Very much  Relationships  . Social connections:    Talks on phone: Not on file    Gets together: Not on file    Attends religious service: Not on file    Active member of club or organization: Not on file    Attends meetings of clubs or organizations: Not on file    Relationship status: Not on file  Other Topics Concern  . Not on file  Social History Narrative  . Not on file   Hospital Course: (Per Md's discharge SRA): [name] is a 32 y/o F with history of bipolar disorder, PTSD, and polysubstance abuse who was [facility] where she presented with worsening depression, anxiety, and relapse of heroin. She was medically cleared and then transferred to [facility] for additional treatment and stabilization. She was started on trial of abilify and lamictal as well as the COWS  protocol with clonidine for treatment of withdrawal from opiates. She reported incremental improvement of her presenting symptoms.  Today upon evaluation, pt shares, "I'm good." She reports that overall she is doing well and she feels physically better as well has having improved mood. She is sleeping well. Her appetite is good. She denies other physical complaints. She denies SI/HI/AH/VH. She is tolerating her medications well. She requests to discharge so that she can return to caring for her daughter; she is future-oriented and she plans to stay with her sponsor after discharge. She will continue to consider residential treatment. She was able to engage in safety planning  including [facility] or contact emergency services if she feels unable to maintain her own safety or the safety of others. Pt had no further questions, comments, or concerns.  Plan Of Care/Follow-up recommendations:   - Discharge to outpatient level of care  -Bipolar I, current episode depressed with psychotic features - Continue Abilify 10 mg po daily. - Continue Lamictal 25 mg po daily.  -Agitation/anxiety. - Continue Gabapentin 300 mg po tid. -Continue Buspar 10 mg po bid. - Continue Vistaril 25 mg po Q 6 hours prn anxiety.  -Insomnia. - Continue Trazodone 100 mg po qhs prn insomnia  Other medical complaints. - Continue Flagyl 500 mg po bid x 7 days total for yeast infection.  Activity:  as tolerated Diet:  normal Tests:  NA Other:  see above for DC plan  Physical Findings: AIMS: Facial and Oral Movements Muscles of Facial Expression: None, normal Lips and Perioral Area: None, normal Jaw: None, normal Tongue: None, normal,Extremity Movements Upper (arms, wrists, hands, fingers): None, normal Lower (legs, knees, ankles, toes): None, normal, Trunk Movements Neck, shoulders, hips: None, normal, Overall Severity Severity of abnormal movements (highest score from questions above): None, normal Incapacitation due to abnormal movements: None, normal Patient's awareness of abnormal movements (rate only patient's report): No Awareness, Dental Status Current problems with teeth and/or dentures?: No Does patient usually wear dentures?: No  CIWA:  CIWA-Ar Total: 7 COWS:  COWS Total Score: 0  Musculoskeletal: Strength & Muscle Tone: within normal limits Gait & Station: normal Patient leans: N/A  Psychiatric Specialty Exam: Physical Exam  Constitutional: She appears well-developed.  HENT:  Head: Normocephalic.  Eyes: Pupils are equal, round, and reactive to light.  Neck: Normal range of motion.  Cardiovascular: Normal  rate.  Respiratory: Effort normal.  GI: Soft.  Genitourinary:  Genitourinary Comments: Deferred  Musculoskeletal: Normal range of motion.  Neurological: She is alert.  Skin: Skin is warm.    Review of Systems  Constitutional: Negative.   HENT: Negative.   Eyes: Negative.   Respiratory: Negative.  Negative for cough and shortness of breath.   Cardiovascular: Negative.  Negative for chest pain and palpitations.  Gastrointestinal: Negative.   Genitourinary: Negative.   Musculoskeletal: Negative.   Skin: Negative.   Neurological: Negative.   Endo/Heme/Allergies: Negative.   Psychiatric/Behavioral: Positive for depression (Stable) and substance abuse (Hx. Opioid use disorder). Negative for hallucinations, memory loss and suicidal ideas. The patient has insomnia (Stable). The patient is not nervous/anxious.     Blood pressure 134/86, pulse 60, temperature 98.1 F (36.7 C), temperature source Oral, resp. rate 16, height 5\' 4"  (1.626 m), weight 58.1 kg, last menstrual period 09/09/2017, SpO2 100 %, currently breastfeeding.Body mass index is 21.97 kg/m.  See Md's SRA   Has this patient used any form of tobacco in the last 30 days? (Cigarettes, Smokeless Tobacco, Cigars, and/or  Pipes): Yes, an FDA-approved tobacco cessation medication was offered at discharge.  Blood Alcohol level:  Lab Results  Component Value Date   ETH <10 09/11/2017   Metabolic Disorder Labs:  No results found for: HGBA1C, MPG No results found for: PROLACTIN No results found for: CHOL, TRIG, HDL, CHOLHDL, VLDL, LDLCALC  See Psychiatric Specialty Exam and Suicide Risk Assessment completed by Attending Physician prior to discharge.  Discharge destination:  Home  Is patient on multiple antipsychotic therapies at discharge:  No   Has Patient had three or more failed trials of antipsychotic monotherapy by history:  No  Recommended Plan for Multiple Antipsychotic Therapies: NA  Allergies as of 09/14/2017       Reactions   Penicillins Rash, Other (See Comments)   Causes yeast infection Has patient had a PCN reaction causing immediate rash, facial/tongue/throat swelling, SOB or lightheadedness with hypotension: Yes Has patient had a PCN reaction causing severe rash involving mucus membranes or skin necrosis: No Has patient had a PCN reaction that required hospitalization: No Has patient had a PCN reaction occurring within the last 10 years: No If all of the above answers are "NO", then may proceed with Cephalosporin use.      Medication List    STOP taking these medications   ibuprofen 200 MG tablet Commonly known as:  ADVIL,MOTRIN   ibuprofen 600 MG tablet Commonly known as:  ADVIL,MOTRIN   NEXPLANON 68 MG Impl implant Generic drug:  etonogestrel     TAKE these medications     Indication  ARIPiprazole 10 MG tablet Commonly known as:  ABILIFY Take 1 tablet (10 mg total) by mouth daily. For mood control  Indication:  Mood control   busPIRone 10 MG tablet Commonly known as:  BUSPAR Take 1 tablet (10 mg total) by mouth 2 (two) times daily. For anxiety  Indication:  Anxiety Disorder   gabapentin 300 MG capsule Commonly known as:  NEURONTIN Take 1 capsule (300 mg total) by mouth 3 (three) times daily. Agitation  Indication:  Agitation   hydrOXYzine 25 MG tablet Commonly known as:  ATARAX/VISTARIL Take 1 tablet (25 mg total) by mouth every 6 (six) hours as needed for anxiety.  Indication:  Feeling Anxious   lamoTRIgine 25 MG tablet Commonly known as:  LAMICTAL Take 1 tablet (25 mg total) by mouth daily. For mood stabilization  Indication:  Mood stabilization   metroNIDAZOLE 500 MG tablet Commonly known as:  FLAGYL Take 1 tablet (500 mg total) by mouth every 12 (twelve) hours. For yeast infection  Indication:  Yeast infection   nicotine 21 mg/24hr patch Commonly known as:  NICODERM CQ - dosed in mg/24 hours Place 1 patch (21 mg total) onto the skin daily. (May buy from over  the counter): For smoking cessation Start taking on:  09/15/2017  Indication:  Nicotine Addiction   traZODone 100 MG tablet Commonly known as:  DESYREL Take 1 tablet (100 mg total) by mouth at bedtime as needed for sleep.  Indication:  Trouble Sleeping      Follow-up Information    [facility] Follow up on 09/16/2017.   Specialty:  Behavioral Health Why:  Hospital follow-up on Friday, 8/23 at 8:00AM. Please bring the following if you have it: photo ID, social security card, any proof of income, medicaid card, and hospital discharge paperwork. Thank you. Contact information: [address] [place] [address] 220 [phone]        [facility] Follow [facility].   Why:  Please  walk in within 3 days of hospital discharge to be assessed for Substance Abuse Intensive Outpatient Program. Walk in hours: Monday, Wednesday, and Fridays between 11am-12:30pm. Thank you.  Contact information: [address], [place] [address] Phone: [phone] Fax: [phone]       [facility] Follow up.   Contact information: [address] [place] [address] [phone]         Follow-up recommendations: Activity:  As tolerated Diet: As recommended by your primary care doctor. Keep all scheduled follow-up appointments as recommended.    Comments: Patient is instructed prior to discharge to: Take all medications as prescribed by his/her mental healthcare provider. Report any adverse effects and or reactions from the medicines to his/her outpatient provider promptly. Patient has been instructed & cautioned: To not engage in alcohol and or illegal drug use while on prescription medicines. In the event of worsening symptoms, patient is instructed to call the crisis hotline, 911 and or go to the nearest ED for appropriate evaluation and treatment of symptoms. To follow-up with his/her primary care provider for your other medical  issues, concerns and or health care needs.   Signed: Armandina Stammer, NP, PMHNP, FNP-BC 09/14/2017, 9:25 AM   Patient seen, Suicide Assessment Completed.  Disposition Plan Reviewed

## 2017-09-14 NOTE — ED Triage Notes (Signed)
Pt states just finished detox. States she vomited worms 2 hours ago. Were skinny 2-3 inch worms. havent noticed worms in stool. Nad.

## 2017-09-14 NOTE — Plan of Care (Signed)
Discharge Note  Patient verbalizes readiness for discharge. Follow up plan explained, AVS, Transition record and SRA given. Prescriptions and teaching provided Belongings returned and signed for. Suicide safety plan completed and signed. Patient verbalizes understanding. Patient denies SI/HI and assures this Clinical research associatewriter he will seek assistance should that change. Patient discharged to lobby where ride was waiting.  Problem: Education: Goal: Knowledge of [facility] Education information/materials will improve Outcome: Adequate for Discharge Goal: Emotional status will improve Outcome: Adequate for Discharge Goal: Mental status will improve Outcome: Adequate for Discharge Goal: Verbalization of understanding the information provided will improve Outcome: Adequate for Discharge   Problem: Activity: Goal: Interest or engagement in activities will improve Outcome: Adequate for Discharge Goal: Sleeping patterns will improve Outcome: Adequate for Discharge   Problem: Coping: Goal: Ability to verbalize frustrations and anger appropriately will improve Outcome: Adequate for Discharge Goal: Ability to demonstrate self-control will improve Outcome: Adequate for Discharge   Problem: Health Behavior/Discharge Planning: Goal: Identification of resources available to assist in meeting health care needs will improve Outcome: Adequate for Discharge Goal: Compliance with treatment plan for underlying cause of condition will improve Outcome: Adequate for Discharge   Problem: Physical Regulation: Goal: Ability to maintain clinical measurements within normal limits will improve Outcome: Adequate for Discharge   Problem: Safety: Goal: Periods of time without injury will increase Outcome: Adequate for Discharge   Problem: Education: Goal: Utilization of techniques to improve thought processes will improve Outcome: Adequate for Discharge Goal: Knowledge of the prescribed therapeutic regimen  will improve Outcome: Adequate for Discharge   Problem: Activity: Goal: Interest or engagement in leisure activities will improve Outcome: Adequate for Discharge Goal: Imbalance in normal sleep/wake cycle will improve Outcome: Adequate for Discharge   Problem: Coping: Goal: Coping ability will improve Outcome: Adequate for Discharge Goal: Will verbalize feelings Outcome: Adequate for Discharge   Problem: Health Behavior/Discharge Planning: Goal: Ability to make decisions will improve Outcome: Adequate for Discharge Goal: Compliance with therapeutic regimen will improve Outcome: Adequate for Discharge   Problem: Role Relationship: Goal: Will demonstrate positive changes in social behaviors and relationships Outcome: Adequate for Discharge   Problem: Safety: Goal: Ability to disclose and discuss suicidal ideas will improve Outcome: Adequate for Discharge Goal: Ability to identify and utilize support systems that promote safety will improve Outcome: Adequate for Discharge   Problem: Self-Concept: Goal: Will verbalize positive feelings about self Outcome: Adequate for Discharge Goal: Level of anxiety will decrease Outcome: Adequate for Discharge   Problem: Education: Goal: Ability to make informed decisions regarding treatment will improve Outcome: Adequate for Discharge   Problem: Coping: Goal: Coping ability will improve Outcome: Adequate for Discharge   Problem: Health Behavior/Discharge Planning: Goal: Identification of resources available to assist in meeting health care needs will improve Outcome: Adequate for Discharge   Problem: Medication: Goal: Compliance with prescribed medication regimen will improve Outcome: Adequate for Discharge   Problem: Self-Concept: Goal: Ability to disclose and discuss suicidal ideas will improve Outcome: Adequate for Discharge Goal: Will verbalize positive feelings about self Outcome: Adequate for Discharge   Problem:  Education: Goal: Knowledge of disease or condition will improve Outcome: Adequate for Discharge Goal: Understanding of discharge needs will improve Outcome: Adequate for Discharge   Problem: Health Behavior/Discharge Planning: Goal: Ability to identify changes in lifestyle to reduce recurrence of condition will improve Outcome: Adequate for Discharge Goal: Identification of resources available to assist in meeting health care needs will improve Outcome: Adequate for Discharge   Problem: Physical Regulation:  Goal: Complications related to the disease process, condition or treatment will be avoided or minimized Outcome: Adequate for Discharge   Problem: Safety: Goal: Ability to remain free from injury will improve Outcome: Adequate for Discharge

## 2017-09-14 NOTE — Progress Notes (Signed)
Recreation Therapy Notes  Date: 9.21.19 Time: 0930 Location: [address] Dayroom  Group Topic: Stress Management  Goal Area(s) Addresses:  Patient will verbalize importance of using healthy stress management.  Patient will identify positive emotions associated with healthy stress management.   Behavioral Response: Engaged  Intervention: Stress Management  Activity : Guided Imagery.  LRT introduced the stress management technique of guided imagery.  LRT read [name] script that allowed patients to envision being on the beach.  Patients were to follow along as LRT read script.  Education:  Stress Management, Discharge Planning.   Education Outcome: Acknowledges edcuation/In group clarification offered/Needs additional education  Clinical Observations/Feedback: Pt attended and participated in group.    [name], LRT/CTRS         Beth RancherLindsay, [name] 09/14/2017 10:52 AM

## 2017-09-14 NOTE — [facility] Suicide Risk Assessment (Signed)
[name] Discharge Suicide Risk Assessment   Principal Problem: Bipolar I disorder, current or most recent episode depressed, with psychotic features [facility](HCC) Discharge Diagnoses:  Patient Active Problem List   Diagnosis Date Noted  . Bipolar I disorder, current or most recent episode depressed, with psychotic features (HCC) [F31.5] 09/11/2017  . Indication for care in labor or delivery [O75.9] 04/17/2014  . SVD (spontaneous vaginal delivery) [O80] 04/17/2014    Total Time spent with patient: 30 minutes  Musculoskeletal: Strength & Muscle Tone: within normal limits Gait & Station: normal Patient leans: N/A  Psychiatric Specialty Exam: Review of Systems  Constitutional: Negative for chills and fever.  Respiratory: Negative for cough and shortness of breath.   Cardiovascular: Negative for chest pain.  Gastrointestinal: Negative for abdominal pain, heartburn, nausea and vomiting.  Psychiatric/Behavioral: Negative for depression, hallucinations and suicidal ideas. The patient is not nervous/anxious and does not have insomnia.     Blood pressure 134/86, pulse 60, temperature 98.1 F (36.7 C), temperature source Oral, resp. rate 16, height 5\' 4"  (1.626 m), weight 58.1 kg, last menstrual period 09/09/2017, SpO2 100 %, currently breastfeeding.Body mass index is 21.97 kg/m.  General Appearance: Casual and Fairly Groomed  Patent attorneyye Contact::  Good  Speech:  Clear and Coherent and Normal Rate  Volume:  Normal  Mood:  Euthymic  Affect:  Appropriate and Congruent  Thought Process:  Coherent and Goal Directed  Orientation:  Full (Time, Place, and Person)  Thought Content:  Logical  Suicidal Thoughts:  No  Homicidal Thoughts:  No  Memory:  Immediate;   Fair Recent;   Fair Remote;   Fair  Judgement:  Fair  Insight:  Lacking  Psychomotor Activity:  Normal  Concentration:  Fair  Recall:  FiservFair  Fund of Knowledge:Fair  Language: Fair  Akathisia:  No  Handed:    AIMS (if indicated):     Assets:   Communication Skills Resilience Social Support  Sleep:  Number of Hours: 6.5  Cognition: WNL  ADL's:  Intact   Mental Status Per Nursing Assessment::   On Admission:  NA  Demographic Factors:  Adolescent or young adult, Caucasian and Low socioeconomic status  Loss Factors: Loss of significant relationship and Financial problems/change in socioeconomic status  Historical Factors: Impulsivity  Risk Reduction Factors:   Responsible for children under 34 years of age, Positive social support, Positive therapeutic relationship and Positive coping skills or problem solving skills  Continued Clinical Symptoms:  Severe Anxiety and/or Agitation Bipolar Disorder:   Depressive phase Alcohol/Substance Abuse/Dependencies  Cognitive Features That Contribute To Risk:  None    Suicide Risk:  Minimal: No identifiable suicidal ideation.  Patients presenting with no risk factors but with morbid ruminations; may be classified as minimal risk based on the severity of the depressive symptoms  Follow-up Information    [facility] Follow up on 09/16/2017.   Specialty:  Behavioral Health Why:  Hospital follow-up on Friday, 8/23 at 8:00AM. Please bring the following if you have it: photo ID, social security card, any proof of income, medicaid card, and hospital discharge paperwork. Thank you. Contact information: [address] [place] [address] [phone]        [facility] Follow up.   Why:  Please walk in within 3 days of hospital discharge to be assessed for Substance Abuse Intensive Outpatient Program. Walk in hours: Monday, Wednesday, and Fridays between 11am-12:30pm. Thank you.  Contact information: [address] [place], [place] [address] Phone: [phone] Fax: [phone]  [facility] Follow up.   Specialty:  Internal Medicine Why:  Please call the office on 9/1 when the schedule opens for that month to get on the schedule as a new  patient (make sure to request primary care appt). Thank you.  Contact information: [address] [place] [id number] [phone]        Subjective Data:  [name] is a 32 y/o F with history of bipolar disorder, PTSD, and polysubstance abuse who was [facility] from [facility] where she presented with worsening depression, anxiety, and relapse of heroin. She was medically cleared and [facility]. She was started on trial of abilify and lamictal as well as the COWS protocol with clonidine for treatment of withdrawal from opiates. She reported incremental improvement of her presenting symptoms.  Today upon evaluation, pt shares, "I'm good." She reports that overall she is doing well and she feels physically better as well has having improved mood. She is sleeping well. Her appetite is good. She denies other physical complaints. She denies SI/HI/AH/VH. She is tolerating her medications well. She requests to discharge so that she can return to caring for her daughter; she is future-oriented and she plans to stay with her sponsor after discharge. She will continue to consider residential treatment. She was able to engage in safety planning including [facility] or contact emergency services if she feels unable to maintain her own safety or the safety of others. Pt had no further questions, comments, or concerns.   Plan Of Care/Follow-up recommendations:   - Discharge to outpatient level of care  -Bipolar I, current episode depressed with psychotic features     - Continue Abilify 10 mg po daily.      - Continue Lamictal 25 mg po daily.  -Agitation/anxiety.     - Continue Gabapentin 300 mg po tid.     - Continue Buspar 10 mg po bid.     - Continue Vistaril 25 mg po Q 6 hours prn anxiety.  -Insomnia.      - Continue Trazodone 100 mg po qhs prn insomnia  Other medical complaints.       - Continue  Flagyl 500 mg po bid x 7 days total for yeast infection.  Activity:  as tolerated Diet:  normal Tests:  NA Other:  see above for DC plan  Micheal Likenshristopher T Chau Sawin, MD 09/14/2017, 9:47 AM

## 2017-09-14 NOTE — Progress Notes (Signed)
  [facility] Discharge Plan :  Will you be returning to the same living situation after discharge:  No.Pt plans to stay with [place] friend in [place] at discharge until she is able to get into an [facility].  At discharge, do you have transportation home?: Yes,  friend may pick her up. pt provided with bus pass in case her ride does not come. Do you have the ability to pay for your medications: Yes,  medicaid  Release of information consent forms completed and submitted to medical records by CSW.   Patient to Follow up at: Follow-up Information    [facility] Follow up on 09/16/2017.   Specialty:  Behavioral Health Why:  Hospital follow-up on Friday, 8/23 at 8:00AM. Please bring the following if you have it: photo ID, social security card, any proof of income, medicaid card, and hospital discharge paperwork. Thank you. Contact information: [address] [place] [address] [phone]        [facility] Follow [facility].   Why:  Please walk in within 3 days of hospital discharge to be assessed for Substance Abuse Intensive Outpatient Program. Walk in hours: Monday, Wednesday, and Fridays between 11am-12:30pm. Thank you.  Contact information: [address] [place], [place] [address] Phone: [phone] Fax: [phone]       [facility].   Specialty:  Internal Medicine Why:  Please call the office on 9/1 when the schedule opens for that month to get on the schedule as a new patient (make sure to request primary care appt). Thank you.  Contact information: [address] [place] [id number] [phone]          Next level of care provider has access to [facility] Link:no  Safety Planning and Suicide Prevention discussed: Yes,  SPE completed with pt; pt declined to consent to collateral contact. SPI pamphlet and Mobile Crisis information provided to pt.      Has patient been referred to the Quitline?:  Patient refused referral  Patient has been referred for addiction treatment: Yes  Rona RavensHeather S Wendi Lastra, LCSW 09/14/2017, 9:26 AM

## 2017-09-15 ENCOUNTER — Emergency Department (HOSPITAL_BASED_OUTPATIENT_CLINIC_OR_DEPARTMENT_OTHER)
Admission: EM | Admit: 2017-09-15 | Discharge: 2017-09-15 | Disposition: A | Discharge status: Home / Self Care | Payer: Medicaid Other | Attending: Emergency Medicine | Admitting: Emergency Medicine

## 2017-09-15 ENCOUNTER — Encounter (HOSPITAL_BASED_OUTPATIENT_CLINIC_OR_DEPARTMENT_OTHER): Payer: Self-pay | Admitting: Emergency Medicine

## 2017-09-15 ENCOUNTER — Other Ambulatory Visit: Payer: Self-pay

## 2017-09-15 ENCOUNTER — Other Ambulatory Visit: Payer: Self-pay | Admitting: Emergency Medicine

## 2017-09-15 DIAGNOSIS — R1084 Generalized abdominal pain: Secondary | ICD-10-CM | POA: Diagnosis not present

## 2017-09-15 DIAGNOSIS — R197 Diarrhea, unspecified: Secondary | ICD-10-CM | POA: Diagnosis not present

## 2017-09-15 DIAGNOSIS — F17210 Nicotine dependence, cigarettes, uncomplicated: Secondary | ICD-10-CM | POA: Diagnosis not present

## 2017-09-15 DIAGNOSIS — R112 Nausea with vomiting, unspecified: Secondary | ICD-10-CM | POA: Insufficient documentation

## 2017-09-15 DIAGNOSIS — Z79899 Other long term (current) drug therapy: Secondary | ICD-10-CM | POA: Diagnosis not present

## 2017-09-15 LAB — URINALYSIS, ROUTINE W REFLEX MICROSCOPIC
BILIRUBIN URINE: NEGATIVE
Glucose, UA: NEGATIVE mg/dL
KETONES UR: 15 mg/dL — AB
Leukocytes, UA: NEGATIVE
Nitrite: NEGATIVE
PH: 6 (ref 5.0–8.0)
Protein, ur: NEGATIVE mg/dL
Specific Gravity, Urine: 1.02 (ref 1.005–1.030)

## 2017-09-15 LAB — CBC WITH DIFFERENTIAL/PLATELET
Basophils Absolute: 0 10*3/uL (ref 0.0–0.1)
Basophils Relative: 0 %
EOS ABS: 0 10*3/uL (ref 0.0–0.7)
EOS PCT: 0 %
HCT: 40.5 % (ref 36.0–46.0)
Hemoglobin: 14.2 g/dL (ref 12.0–15.0)
LYMPHS ABS: 2.8 10*3/uL (ref 0.7–4.0)
Lymphocytes Relative: 21 %
MCH: 35 pg — AB (ref 26.0–34.0)
MCHC: 35.1 g/dL (ref 30.0–36.0)
MCV: 99.8 fL (ref 78.0–100.0)
MONO ABS: 0.8 10*3/uL (ref 0.1–1.0)
Monocytes Relative: 6 %
Neutro Abs: 9.7 10*3/uL — ABNORMAL HIGH (ref 1.7–7.7)
Neutrophils Relative %: 73 %
PLATELETS: 336 10*3/uL (ref 150–400)
RBC: 4.06 MIL/uL (ref 3.87–5.11)
RDW: 12.6 % (ref 11.5–15.5)
WBC: 13.3 10*3/uL — ABNORMAL HIGH (ref 4.0–10.5)

## 2017-09-15 LAB — COMPREHENSIVE METABOLIC PANEL
ALBUMIN: 5 g/dL (ref 3.5–5.0)
ALT: 13 U/L (ref 0–44)
AST: 16 U/L (ref 15–41)
Alkaline Phosphatase: 54 U/L (ref 38–126)
Anion gap: 10 (ref 5–15)
BILIRUBIN TOTAL: 0.7 mg/dL (ref 0.3–1.2)
BUN: 17 mg/dL (ref 6–20)
CO2: 26 mmol/L (ref 22–32)
Calcium: 9.7 mg/dL (ref 8.9–10.3)
Chloride: 104 mmol/L (ref 98–111)
Creatinine, Ser: 0.88 mg/dL (ref 0.44–1.00)
GFR calc Af Amer: >60 mL/min (ref >60)
GFR calc non Af Amer: >60 mL/min (ref >60)
GLUCOSE: 106 mg/dL — AB (ref 70–99)
POTASSIUM: 3.8 mmol/L (ref 3.5–5.1)
Sodium: 140 mmol/L (ref 135–145)
TOTAL PROTEIN: 8.6 g/dL — AB (ref 6.5–8.1)

## 2017-09-15 LAB — URINALYSIS, MICROSCOPIC (REFLEX)

## 2017-09-15 LAB — PREGNANCY, URINE: Preg Test, Ur: NEGATIVE

## 2017-09-15 LAB — LIPASE, BLOOD: Lipase: 30 U/L (ref 11–51)

## 2017-09-15 MED ORDER — ONDANSETRON HCL 4 MG PO TABS: 4.0000 mg | ORAL_TABLET | Freq: Four times a day (QID) | ORAL | 0 refills | Status: DC

## 2017-09-15 NOTE — ED Triage Notes (Addendum)
Reports an episode of vomiting last night that had worms in it.  [facility].  Left after triage from [facility] last night.

## 2017-09-15 NOTE — ED Notes (Signed)
ED Provider at bedside. 

## 2017-09-15 NOTE — ED Provider Notes (Signed)
[facility] EMERGENCY DEPARTMENT Provider Note   CSN: [id number] Arrival date & time: 09/15/17  4098     History   Chief Complaint Chief Complaint  Patient presents with  . Emesis    HPI [name] is a 32 y.o. female with PMH/o Depression, Substance Abuse who presents for evaluation of 2-3 days of diarrhea and nausea/vomiting. She states that she has had several episodes of watery diarrhea over the last few days. No blood noted in the stools. She was recently on Flagyl for treatment of BV but no other antibiotics. She also reports she has had a few episodes of vomiting over the last 48 hours. She states that yesterday when she vomited she noted two worms in her emesis. No blood, no bile. Patient states she has some intermittent abdominal cramping that gets worse right before she vomits and better after she vomits. She has had some decreased appetite secondary to symptoms. She was recently admitted and discharged yesterday for treatment of SI and substance abuse. She denies any cocaine, heroin, alcohol use. She reports that a few weeks ago she did take care of a dog she found but states that the owners were found after a few days and the dog was returned. She did not notice any worms in the dog's stool. She denies any fevers, CP, SOB, urinary complaints.   The history is provided by the patient.    Past Medical History:  Diagnosis Date  . Depression   . Drug addiction in remission (HCC)   . SVD (spontaneous vaginal delivery) 04/17/2014    Patient Active Problem List   Diagnosis Date Noted  . Bipolar I disorder, current or most recent episode depressed, with psychotic features (HCC) 09/11/2017  . Indication for care in labor or delivery 04/17/2014  . SVD (spontaneous vaginal delivery) 04/17/2014    Past Surgical History:  Procedure Laterality Date  . BREAST SURGERY     reduction     OB History    Gravida  3   Para  3   Term  3   Preterm      AB     Living  1     SAB      TAB      Ectopic      Multiple  0   Live Births  1            Home Medications    Prior to Admission medications   Medication Sig Start Date End Date Taking? Authorizing Provider  ARIPiprazole (ABILIFY) 10 MG tablet Take 1 tablet (10 mg total) by mouth daily. For mood control 09/14/17   Armandina Stammer I, NP  busPIRone (BUSPAR) 10 MG tablet Take 1 tablet (10 mg total) by mouth 2 (two) times daily. For anxiety 09/14/17   Armandina Stammer I, NP  gabapentin (NEURONTIN) 300 MG capsule Take 1 capsule (300 mg total) by mouth 3 (three) times daily. Agitation 09/14/17   Armandina Stammer I, NP  hydrOXYzine (ATARAX/VISTARIL) 25 MG tablet Take 1 tablet (25 mg total) by mouth every 6 (six) hours as needed for anxiety. 09/14/17   Armandina Stammer I, NP  lamoTRIgine (LAMICTAL) 25 MG tablet Take 1 tablet (25 mg total) by mouth daily. For mood stabilization 09/14/17   Armandina Stammer I, NP  metroNIDAZOLE (FLAGYL) 500 MG tablet Take 1 tablet (500 mg total) by mouth every 12 (twelve) hours. For yeast infection 09/14/17   Armandina Stammer I, NP  nicotine (NICODERM CQ - DOSED IN  MG/24 HOURS) 21 mg/24hr patch Place 1 patch (21 mg total) onto the skin daily. (May buy from over the counter): For smoking cessation 09/15/17   Armandina StammerNwoko, Agnes I, NP  ondansetron (ZOFRAN) 4 MG tablet Take 1 tablet (4 mg total) by mouth every 6 (six) hours. 09/15/17   Maxwell CaulLayden, Nillie Bartolotta A, PA-C  traZODone (DESYREL) 100 MG tablet Take 1 tablet (100 mg total) by mouth at bedtime as needed for sleep. 09/14/17   Sanjuana KavaNwoko, Agnes I, NP    Family History History reviewed. No pertinent family history.  Social History Social History   Tobacco Use  . Smoking status: Current Every Day Smoker    Packs/day: 2.00    Years: 10.00    Pack years: 20.00    Types: Cigarettes  . Smokeless tobacco: Never Used  Substance Use Topics  . Alcohol use: No  . Drug use: Yes    Types: Heroin, Marijuana     Allergies   Penicillins   Review of  Systems Review of Systems  Constitutional: Negative for fever.  Respiratory: Negative for cough and shortness of breath.   Cardiovascular: Negative for chest pain.  Gastrointestinal: Positive for abdominal pain, diarrhea and vomiting. Negative for nausea.  Genitourinary: Negative for dysuria and hematuria.  Neurological: Negative for headaches.  All other systems reviewed and are negative.    Physical Exam Updated Vital Signs BP (!) 125/92   Pulse 65   Temp 98.3 F (36.8 C) (Oral)   Resp 12   Ht 5\' 5"  (1.651 m)   Wt 56.7 kg   LMP 09/09/2017   SpO2 100%   BMI 20.80 kg/m   Physical Exam  Constitutional: She is oriented to person, place, and time. She appears well-developed and well-nourished.  HENT:  Head: Normocephalic and atraumatic.  Mouth/Throat: Oropharynx is clear and moist and mucous membranes are normal.  Eyes: Pupils are equal, round, and reactive to light. Conjunctivae, EOM and lids are normal.  Neck: Full passive range of motion without pain.  Cardiovascular: Normal rate, regular rhythm, normal heart sounds and normal pulses. Exam reveals no gallop and no friction rub.  No murmur heard. Pulmonary/Chest: Effort normal and breath sounds normal.  Abdominal: Soft. Normal appearance. There is generalized tenderness. There is no rigidity and no guarding.  Abdomen is soft, non-distended. Diffuse tenderness. No rigidity, guarding.   Musculoskeletal: Normal range of motion.  Neurological: She is alert and oriented to person, place, and time.  Skin: Skin is warm and dry. Capillary refill takes less than 2 seconds.  Psychiatric: She has a normal mood and affect. Her speech is normal.  Nursing note and vitals reviewed.    ED Treatments / Results  Labs (all labs ordered are listed, but only abnormal results are displayed) Labs Reviewed  COMPREHENSIVE METABOLIC PANEL - Abnormal; Notable for the following components:      Result Value   Glucose, Bld 106 (*)    Total  Protein 8.6 (*)    All other components within normal limits  CBC WITH DIFFERENTIAL/PLATELET - Abnormal; Notable for the following components:   WBC 13.3 (*)    MCH 35.0 (*)    Neutro Abs 9.7 (*)    All other components within normal limits  URINALYSIS, ROUTINE W REFLEX MICROSCOPIC - Abnormal; Notable for the following components:   Hgb urine dipstick TRACE (*)    Ketones, ur 15 (*)    All other components within normal limits  GASTROINTESTINAL PANEL BY PCR, STOOL (REPLACES STOOL CULTURE)  OVA +  PARASITE EXAM  LIPASE, BLOOD  PREGNANCY, URINE    EKG None  Radiology No results found.  Procedures Procedures (including critical care time)  Medications Ordered in ED Medications - No data to display   Initial Impression / Assessment and Plan / ED Course  I have reviewed the triage vital signs and the nursing notes.  Pertinent labs & imaging results that were available during my care of the patient were reviewed by me and considered in my medical decision making (see chart for details).     32 y.o. female who presents for evaluation of nausea/vomiting, diarrhea and some generalized abdominal pain.  Reports yesterday her emesis contained worms.  No worms noted in stool.  Emesis and stool are nonbloody.  No fevers noted. Patient is afebrile, non-toxic appearing, sitting comfortably on examination table. Vital signs reviewed and stable.  On exam, patient has diffuse abdominal tenderness with no focal point. Consider acute infectious etiology.  History/physical exam not concerning for appendicitis, diverticulitis, perforation, obstruction, ovarian torsion.  Will plan to check basic labs.  CBC shows leukocytosis of 13.3 with a left shift.  CMP is unremarkable.  Lipase unremarkable.  Pregnancy is negative.  UA shows mild ketones but otherwise no acute abnormalities.  Discussed results with patient.  Currently not having any pain.  Reports nausea is improved after Zofran here in the ED.   No episodes of vomiting here in ED.  We will plan to get a stool sample for evaluation of open parasite.  Will give outpatient GI referral for patient to follow-up with.  Patient unable to provide a stool sample at this time.  Vital signs are stable.  Patient has not had any episodes of vomiting or diarrhea here in the ED.  Patient stable for discharge at this time.  Encourage at home supportive care measures. Patient had ample opportunity for questions and discussion. All patient's questions were answered with full understanding. Strict return precautions discussed. Patient expresses understanding and agreement to plan.   Final Clinical Impressions(s) / ED Diagnoses   Final diagnoses:  Nausea vomiting and diarrhea    ED Discharge Orders         Ordered    ondansetron (ZOFRAN) 4 MG tablet  Every 6 hours     09/15/17 1228           Maxwell Caul, PA-C 09/15/17 1858    Rolan Bucco, MD 09/19/17 2231

## 2017-09-15 NOTE — ED Notes (Signed)
Pt only able to provide a tiny solid stool. Unable to use for GI lab studies per lab tech.

## 2017-09-15 NOTE — Discharge Instructions (Signed)
Take Zofran as directed for nausea.  You can eat things from the BRAT diet which consists of banana, bread, rice, applesauce, toast.  These foods will help with diarrhea.  As we discussed, stool sample will take a few days to come back.  If it is positive, you will be notified.  Her symptoms do not improve or you have any worsening or concerning symptoms, you can follow-up with referred GI doctor.  Return the emergency department for any fever, worsening abdominal pain, persistent vomiting, unable to eat or drink anything, blood in your stools or any other worsening or concerning symptoms.

## 2017-10-17 DIAGNOSIS — F1124 Opioid dependence with opioid-induced mood disorder: Secondary | ICD-10-CM | POA: Insufficient documentation

## 2017-10-17 DIAGNOSIS — F1121 Opioid dependence, in remission: Secondary | ICD-10-CM | POA: Insufficient documentation

## 2017-10-17 DIAGNOSIS — F4310 Post-traumatic stress disorder, unspecified: Secondary | ICD-10-CM | POA: Insufficient documentation

## 2017-10-17 DIAGNOSIS — F603 Borderline personality disorder: Secondary | ICD-10-CM | POA: Insufficient documentation

## 2017-11-28 ENCOUNTER — Ambulatory Visit (HOSPITAL_COMMUNITY): Payer: Medicaid Other | Admitting: Psychiatry

## 2018-03-09 DIAGNOSIS — B009 Herpesviral infection, unspecified: Secondary | ICD-10-CM | POA: Insufficient documentation

## 2018-04-11 DIAGNOSIS — F329 Major depressive disorder, single episode, unspecified: Secondary | ICD-10-CM | POA: Insufficient documentation

## 2018-04-11 DIAGNOSIS — F32A Depression, unspecified: Secondary | ICD-10-CM | POA: Insufficient documentation

## 2018-04-11 DIAGNOSIS — F17210 Nicotine dependence, cigarettes, uncomplicated: Secondary | ICD-10-CM | POA: Insufficient documentation

## 2018-04-26 ENCOUNTER — Ambulatory Visit (INDEPENDENT_AMBULATORY_CARE_PROVIDER_SITE_OTHER): Payer: Medicaid Other | Admitting: Psychiatry

## 2018-04-26 ENCOUNTER — Encounter (HOSPITAL_COMMUNITY): Payer: Self-pay | Admitting: Psychiatry

## 2018-04-26 DIAGNOSIS — F411 Generalized anxiety disorder: Secondary | ICD-10-CM | POA: Diagnosis not present

## 2018-04-26 DIAGNOSIS — F331 Major depressive disorder, recurrent, moderate: Secondary | ICD-10-CM | POA: Diagnosis not present

## 2018-04-26 DIAGNOSIS — Z79899 Other long term (current) drug therapy: Secondary | ICD-10-CM

## 2018-04-26 DIAGNOSIS — F4310 Post-traumatic stress disorder, unspecified: Secondary | ICD-10-CM

## 2018-04-26 DIAGNOSIS — F1121 Opioid dependence, in remission: Secondary | ICD-10-CM | POA: Diagnosis not present

## 2018-04-26 DIAGNOSIS — Z62810 Personal history of physical and sexual abuse in childhood: Secondary | ICD-10-CM

## 2018-04-26 MED ORDER — LAMOTRIGINE 25 MG PO TABS: 25.0000 mg | ORAL_TABLET | Freq: Every day | ORAL | 0 refills | Status: DC

## 2018-04-26 MED ORDER — VENLAFAXINE HCL 37.5 MG PO TABS: 37.5000 mg | ORAL_TABLET | Freq: Two times a day (BID) | ORAL | 1 refills | Status: DC
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
## 2019-07-19 ENCOUNTER — Ambulatory Visit (INDEPENDENT_AMBULATORY_CARE_PROVIDER_SITE_OTHER): Payer: Medicaid Other

## 2019-07-19 ENCOUNTER — Other Ambulatory Visit: Payer: Self-pay

## 2019-07-19 VITALS — BP 101/55 | HR 77 | Wt 134.1 lb

## 2019-07-19 DIAGNOSIS — O3680X0 Pregnancy with inconclusive fetal viability, not applicable or unspecified: Secondary | ICD-10-CM

## 2019-07-19 DIAGNOSIS — B009 Herpesviral infection, unspecified: Secondary | ICD-10-CM

## 2019-07-19 DIAGNOSIS — Z3491 Encounter for supervision of normal pregnancy, unspecified, first trimester: Secondary | ICD-10-CM | POA: Diagnosis not present

## 2019-07-19 DIAGNOSIS — Z3A08 8 weeks gestation of pregnancy: Secondary | ICD-10-CM

## 2019-07-19 DIAGNOSIS — Z3490 Encounter for supervision of normal pregnancy, unspecified, unspecified trimester: Secondary | ICD-10-CM | POA: Insufficient documentation

## 2019-07-19 MED ORDER — VALACYCLOVIR HCL 1 G PO TABS: 1000.0000 mg | ORAL_TABLET | Freq: Two times a day (BID) | ORAL | 0 refills | Status: DC

## 2019-07-19 MED ORDER — BLOOD PRESSURE KIT KIT: 1.0000 | PACK | Freq: Every day | 0 refills | Status: DC

## 2019-07-19 NOTE — Progress Notes (Signed)
PRENATAL INTAKE SUMMARY  Ms. Apple presents today New OB Nurse Interview.  OB History    Gravida  4   Para  3   Term  3   Preterm      AB      Living  1     SAB      TAB      Ectopic      Multiple  0   Live Births  1          I have reviewed the patient's medical, obstetrical, social, and family histories, medications, and available lab results.  SUBJECTIVE She has concerns about drug use and receiving help with getting clean. She is using daily. 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 Discussed with Dr. Clearance Coots. Okay to send Valtrex to pharmacy. Patient scheduled to see provider on Monday to discuss other issues.  OBJECTIVE Initial Physical Exam (New OB)  GENERAL APPEARANCE: alert, well appearing   ASSESSMENT Normal pregnancy  FHR 171  PHQ9 score: 7

## 2019-07-23 ENCOUNTER — Institutional Professional Consult (permissible substitution): Payer: Medicaid Other | Admitting: Obstetrics and Gynecology

## 2019-07-23 ENCOUNTER — Other Ambulatory Visit: Payer: Self-pay

## 2019-07-23 ENCOUNTER — Ambulatory Visit (INDEPENDENT_AMBULATORY_CARE_PROVIDER_SITE_OTHER): Payer: Medicaid Other | Admitting: Obstetrics and Gynecology

## 2019-07-23 ENCOUNTER — Encounter: Payer: Self-pay | Admitting: Obstetrics and Gynecology

## 2019-07-23 VITALS — BP 112/66 | HR 72 | Wt 132.0 lb

## 2019-07-23 DIAGNOSIS — O99321 Drug use complicating pregnancy, first trimester: Secondary | ICD-10-CM | POA: Diagnosis not present

## 2019-07-23 DIAGNOSIS — O99320 Drug use complicating pregnancy, unspecified trimester: Secondary | ICD-10-CM

## 2019-07-23 DIAGNOSIS — Z3A09 9 weeks gestation of pregnancy: Secondary | ICD-10-CM

## 2019-07-23 DIAGNOSIS — F1910 Other psychoactive substance abuse, uncomplicated: Secondary | ICD-10-CM | POA: Diagnosis not present

## 2019-07-23 NOTE — Progress Notes (Signed)
34 yo [id number] here to discuss management of substance abuse in pregnancy. 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 She is being followed in a suboxone clinic and has an Rx 8 mg TID. She states that after taking the first dose she experiences immediate withdrawal symptoms and reverts to using heroin/fentanyl. Patient has successfully stopped cold [place] previously but is struggling this time. She states that she uses heroin daily and it is laced with something else but she is uncertain of what it is.  Past Medical History:  Diagnosis Date  . Depression   . Drug addiction in remission (HCC)   . HSV-2 infection   . SVD (spontaneous vaginal delivery) 04/17/2014   Past Surgical History:  Procedure Laterality Date  . BREAST SURGERY     reduction   Family History  Problem Relation Age of Onset  . Breast cancer Mother   . Stroke Mother   . Breast cancer Maternal Grandmother    Social History   Tobacco Use  . Smoking status: Current Every Day Smoker    Packs/day: 0.25    Years: 10.00    Pack years: 2.50    Types: Cigarettes  . Smokeless tobacco: Never Used  Vaping Use  . Vaping Use: Never used  Substance Use Topics  . Alcohol use: No  . Drug use: Yes    Types: Heroin, Marijuana, Fentanyl   Blood pressure 112/66, pulse 72, weight 132 lb (59.9 kg), last menstrual period 04/30/2019, currently breastfeeding. GENERAL: Well-developed, well-nourished female in no acute distress.  EXTREMITIES: No cyanosis, clubbing, or edema, 2+ distal pulses. NEURO: alert and oriented x 3  A/P 34 yo at 9 wk with substance abuse - Encouraged patient to take full daily dose of suboxone and to present to urgent care for hydration if needed - Patient referred to [facility] for further assistance - Discussed methadone clinic but patient is not interested in Methadone - Additional support provided as her current partner is not aware  that she is still taking heroin

## 2019-07-23 NOTE — Patient Instructions (Addendum)
[facility]  [phone]   Patient verbally agreed to a referral to [facility] for substance treatment services.   [facility] [phone] [email or web address]  *Most insurance, Medicaid, Medicare, some uninsured

## 2019-07-24 ENCOUNTER — Encounter: Payer: Self-pay | Admitting: Obstetrics

## 2019-08-03 ENCOUNTER — Other Ambulatory Visit: Payer: Self-pay

## 2019-08-03 ENCOUNTER — Encounter: Payer: Self-pay | Admitting: Obstetrics

## 2019-08-03 ENCOUNTER — Other Ambulatory Visit (HOSPITAL_COMMUNITY)
Admission: RE | Admit: 2019-08-03 | Discharge: 2019-08-03 | Disposition: A | Discharge status: Home / Self Care | Payer: Medicaid Other | Source: Ambulatory Visit | Attending: Obstetrics and Gynecology | Admitting: Obstetrics and Gynecology

## 2019-08-03 ENCOUNTER — Ambulatory Visit (INDEPENDENT_AMBULATORY_CARE_PROVIDER_SITE_OTHER): Payer: Medicaid Other | Admitting: Obstetrics and Gynecology

## 2019-08-03 ENCOUNTER — Encounter: Payer: Self-pay | Admitting: Obstetrics and Gynecology

## 2019-08-03 VITALS — BP 112/73 | HR 88 | Wt 131.0 lb

## 2019-08-03 DIAGNOSIS — Z8619 Personal history of other infectious and parasitic diseases: Secondary | ICD-10-CM

## 2019-08-03 DIAGNOSIS — F1910 Other psychoactive substance abuse, uncomplicated: Secondary | ICD-10-CM | POA: Diagnosis not present

## 2019-08-03 DIAGNOSIS — N898 Other specified noninflammatory disorders of vagina: Secondary | ICD-10-CM

## 2019-08-03 DIAGNOSIS — O99891 Other specified diseases and conditions complicating pregnancy: Secondary | ICD-10-CM

## 2019-08-03 DIAGNOSIS — Z3480 Encounter for supervision of other normal pregnancy, unspecified trimester: Secondary | ICD-10-CM

## 2019-08-03 DIAGNOSIS — Z3A10 10 weeks gestation of pregnancy: Secondary | ICD-10-CM

## 2019-08-03 DIAGNOSIS — O99321 Drug use complicating pregnancy, first trimester: Secondary | ICD-10-CM

## 2019-08-03 DIAGNOSIS — B009 Herpesviral infection, unspecified: Secondary | ICD-10-CM

## 2019-08-03 DIAGNOSIS — O98511 Other viral diseases complicating pregnancy, first trimester: Secondary | ICD-10-CM

## 2019-08-03 MED ORDER — PREPLUS 27-1 MG PO TABS: 1.0000 | ORAL_TABLET | Freq: Every day | ORAL | 13 refills | Status: DC

## 2019-08-03 NOTE — Progress Notes (Addendum)
INITIAL PRENATAL VISIT NOTE  Subjective:  [name] is a 34 y.o. [id number] at 102w4dby 8 weeks [place] seen today for her initial prenatal visit. This is an unplanned pregnancy. She and partner are happy with the pregnancy. She was using nothing for birth control previously. She has an obstetric history significant for 3 x term SVD. She has a medical history significant for genital HSV.  Actively using fentanyl on streets. [facility].   Patient reports fatigue.  Contractions: Not present. Vag. Bleeding: None.  Movement: Absent. Denies leaking of fluid.    Past Medical History:  Diagnosis Date  . Depression   . [place]   . H/O gonorrhea   . HSV-2 infection   . SVD (spontaneous vaginal delivery) 04/17/2014    Past Surgical History:  Procedure Laterality Date  . BREAST BIOPSY Right   . BREAST SURGERY     reduction    OB History  Gravida Para Term Preterm AB Living  8 3 3   4 3   SAB TAB Ectopic Multiple Live Births    4   0 3    # Outcome Date GA Lbr Len/2nd Weight Sex Delivery Anes PTL Lv  8 Current           7 TAB 12/2017          6 Term 04/17/14 314w1d9:04 / 00:10 7 lb 2.5 oz (3.245 kg) F Vag-Spont EPI  LIV  5 Term 09/19/05 4061w0d lb (4.082 kg) M Vag-Spont   LIV  4 Term 09/16/03 40w53w0d Vag-Spont   LIV  3 TAB           2 TAB           1 TAB             Social History   Socioeconomic History  . Marital status: Significant Other    Spouse name: Not on file  . Number of children: Not on file  . Years of education: Not on file  . Highest education level: Not on file  Occupational History  . Not on file  Tobacco Use  . Smoking status: Current Every Day Smoker    Packs/day: 0.25    Years: 10.00    Pack years: 2.50    Types: Cigarettes  . Smokeless tobacco: Never Used  Vaping Use  . Vaping Use: Never used  Substance and Sexual Activity  . Alcohol use: No  . Drug use: Yes     Types: Heroin, Marijuana, Fentanyl  . Sexual activity: Yes    Partners: Male    Birth control/protection: None  Other Topics Concern  . Not on file  Social History Narrative  . Not on file   Social Determinants of Health   Financial Resource Strain:   . Difficulty of Paying Living Expenses:   Food Insecurity:   . Worried About RunnCharity fundraiserthe Last Year:   . Ran Arboriculturistthe Last Year:   Transportation Needs:   . LackFilm/video editordical):   . LaMarland Kitchenk of Transportation (Non-Medical):   Physical Activity:   . Days of Exercise per Week:   . Minutes of Exercise per Session:   Stress:   . Feeling of Stress :   Social Connections:   . Frequency of Communication with Friends and Family:   . Frequency of Social Gatherings with  Friends and Family:   . Attends Religious Services:   . Active Member of Clubs or Organizations:   . Attends Archivist Meetings:   Marland Kitchen Marital Status:     Family History  Problem Relation Age of Onset  . Breast cancer Mother   . Stroke Mother   . Breast cancer Maternal Grandmother      Current Outpatient Medications:  .  Prenatal Vit w/Fe-Methylfol-FA (PNV PO), Take by mouth., Disp: , Rfl:  .  Blood Pressure Monitoring (BLOOD PRESSURE KIT) KIT, 1 kit by Does not apply route daily. (Patient not taking: Reported on 08/03/2019), Disp: 1 kit, Rfl: 0 .  fentaNYL (FENTORA) 100 MCG buccal tablet, Place 100 mcg inside cheek every 4 (four) hours as needed for breakthrough pain or severe pain., Disp: , Rfl:  .  nicotine (NICODERM CQ - DOSED IN MG/24 HOURS) 21 mg/24hr patch, Place 1 patch (21 mg total) onto the skin daily. (May buy from over the counter): For smoking cessation (Patient not taking: Reported on 07/19/2019), Disp: 28 patch, Rfl: 0 .  ondansetron (ZOFRAN) 4 MG tablet, Take 1 tablet (4 mg total) by mouth every 6 (six) hours. (Patient not taking: Reported on 08/03/2019), Disp: 6 tablet, Rfl: 0 .  Prenatal Vit-Fe Fumarate-FA  (PREPLUS) 27-1 MG TABS, Take 1 tablet by mouth daily., Disp: 30 tablet, Rfl: 13 .  valACYclovir (VALTREX) 500 MG tablet, Take 500 mg by mouth 2 (two) times daily. (Patient not taking: Reported on 08/03/2019), Disp: , Rfl:   Allergies  Allergen Reactions  . Penicillins Rash and Other (See Comments)    Causes yeast infection Has patient had a PCN reaction causing immediate rash, facial/tongue/throat swelling, SOB or lightheadedness with hypotension: Yes Has patient had a PCN reaction causing severe rash involving mucus membranes or skin necrosis: No Has patient had a PCN reaction that required hospitalization: No Has patient had a PCN reaction occurring within the last 10 years: No If all of the above answers are "NO", then may proceed with Cephalosporin use.    Review of Systems: Negative except for what is mentioned in HPI.  Objective:   Vitals:   08/03/19 0923  BP: 112/73  Pulse: 88  Weight: 131 lb (59.4 kg)    Fetal Status: Fetal Heart Rate (bpm): 164   Movement: Absent     Physical Exam: BP 112/73   Pulse 88   Wt 131 lb (59.4 kg)   LMP 04/30/2019   BMI 21.80 kg/m  CONSTITUTIONAL: Well-developed, well-nourished female in no acute distress.  NEUROLOGIC: Alert and oriented to person, place, and time. Normal reflexes, muscle tone coordination. No cranial nerve deficit noted. PSYCHIATRIC: Normal mood and affect. Normal behavior. Normal judgment and thought content. SKIN: Skin is warm and dry. No rash noted. Not diaphoretic. No erythema. No pallor. HENT:  Normocephalic, atraumatic, External right and left ear normal. Oropharynx is clear and moist EYES: Conjunctivae and EOM are normal. Pupils are equal, round, and reactive to light. No scleral icterus.  NECK: Normal range of motion, supple, no masses CARDIOVASCULAR: Normal heart rate noted, regular rhythm RESPIRATORY: Effort and breath sounds normal, no problems with respiration noted BREASTS: symmetric, non-tender, no masses  palpable ABDOMEN: Soft, nontender, nondistended, gravid. GU: normal appearing external female genitalia, multiparous normal appearing cervix, scant white discharge in vagina, no lesions noted Bimanual: 10 weeks sized uterus, no adnexal tenderness or palpable lesions noted MUSCULOSKELETAL: Normal range of motion. EXT:  No edema and no tenderness. 2+ distal pulses.  Assessment and Plan:  Pregnancy: [id number] at 13w4dby 8 weeks [place] 1. Supervision of other normal pregnancy, antepartum Reviewed [facility], multiple providers, fellows, medical students, virtual visits, MyChart.  - Cytology - PAP - Obstetric Panel, Including HIV - Culture, OB Urine - Hepatitis C Antibody - Genetic Screening  2. Substance abuse ([facility], I reviewed that we do not do in hospital detox, additionally, given GA, we would be unable to offer any interventions prior to viability. [facility] verbalizes understanding [facility], [place] - ToxASSURE [place] [facility] ([place]), Urine  3. Vaginal discharge - Cervicovaginal ancillary only  4. Herpes simplex Will needs ppx    Preterm labor symptoms and general obstetric precautions including but not limited to vaginal bleeding, contractions, leaking of fluid and fetal movement were reviewed in detail with the patient.  Please refer to After Visit Summary for other counseling recommendations.   Return in about 4 weeks (around 08/31/2019) for high OB, in person.  KSloan Leiter7/09/2019 11:06 AM

## 2019-08-03 NOTE — Progress Notes (Signed)
Pt presents for NOB visit c/o increased vaginal discharge Using Fentanyl 1g po daily Pap due

## 2019-08-04 LAB — OBSTETRIC PANEL, INCLUDING HIV
Antibody Screen: NEGATIVE
Basophils Absolute: 0 10*3/uL (ref 0.0–0.2)
Basos: 0 %
EOS (ABSOLUTE): 0.1 10*3/uL (ref 0.0–0.4)
Eos: 1 %
HIV Screen 4th Generation wRfx: NONREACTIVE
Hematocrit: 35.2 % (ref 34.0–46.6)
Hemoglobin: 11.8 g/dL (ref 11.1–15.9)
Hepatitis B Surface Ag: NEGATIVE
Immature Grans (Abs): 0 10*3/uL (ref 0.0–0.1)
Immature Granulocytes: 0 %
Lymphocytes Absolute: 2.4 10*3/uL (ref 0.7–3.1)
Lymphs: 32 %
MCH: 32 pg (ref 26.6–33.0)
MCHC: 33.5 g/dL (ref 31.5–35.7)
MCV: 95 fL (ref 79–97)
Monocytes Absolute: 0.5 10*3/uL (ref 0.1–0.9)
Monocytes: 6 %
Neutrophils Absolute: 4.6 10*3/uL (ref 1.4–7.0)
Neutrophils: 61 %
Platelets: 302 10*3/uL (ref 150–450)
RBC: 3.69 x10E6/uL — ABNORMAL LOW (ref 3.77–5.28)
RDW: 13.3 % (ref 11.7–15.4)
RPR Ser Ql: NONREACTIVE
Rh Factor: POSITIVE
Rubella Antibodies, IGG: 6 index (ref >0.99)
WBC: 7.6 10*3/uL (ref 3.4–10.8)

## 2019-08-04 LAB — HEPATITIS C ANTIBODY: Hep C Virus Ab: 0.1 s/co ratio (ref 0.0–0.9)

## 2019-08-05 LAB — URINE CULTURE, OB REFLEX: Organism ID, Bacteria: NO GROWTH

## 2019-08-05 LAB — CULTURE, OB URINE

## 2019-08-06 LAB — CERVICOVAGINAL ANCILLARY ONLY
Bacterial Vaginitis (gardnerella): NEGATIVE
Candida Glabrata: NEGATIVE
Candida Vaginitis: POSITIVE — AB
Chlamydia: NEGATIVE
Comment: NEGATIVE
Comment: NEGATIVE
Comment: NEGATIVE
Comment: NEGATIVE
Comment: NEGATIVE
Comment: NORMAL
Neisseria Gonorrhea: NEGATIVE
Trichomonas: NEGATIVE

## 2019-08-06 MED ORDER — CLOTRIMAZOLE 1 % VA CREA: 1.0000 | TOPICAL_CREAM | Freq: Every day | VAGINAL | 2 refills | Status: DC

## 2019-08-06 NOTE — Addendum Note (Signed)
Addended by: Leroy Libman on: 08/06/2019 04:52 PM   Modules accepted: Orders

## 2019-08-13 ENCOUNTER — Encounter: Payer: Self-pay | Admitting: Obstetrics and Gynecology

## 2019-08-13 ENCOUNTER — Other Ambulatory Visit: Payer: Self-pay

## 2019-08-13 DIAGNOSIS — O285 Abnormal chromosomal and genetic finding on antenatal screening of mother: Secondary | ICD-10-CM

## 2019-08-15 ENCOUNTER — Encounter: Payer: Self-pay | Admitting: Obstetrics and Gynecology

## 2019-08-15 DIAGNOSIS — R87810 Cervical high risk human papillomavirus (HPV) DNA test positive: Secondary | ICD-10-CM | POA: Insufficient documentation

## 2019-08-15 LAB — CYTOLOGY - PAP
Comment: NEGATIVE
Comment: NEGATIVE
Diagnosis: NEGATIVE
HPV 16: NEGATIVE
HPV 18 / 45: NEGATIVE
High risk HPV: POSITIVE — AB

## 2019-08-20 ENCOUNTER — Ambulatory Visit: Payer: Self-pay | Admitting: Genetic Counselor

## 2019-08-20 ENCOUNTER — Ambulatory Visit: Payer: Medicaid Other | Attending: Obstetrics and Gynecology | Admitting: Genetic Counselor

## 2019-08-20 ENCOUNTER — Other Ambulatory Visit: Payer: Self-pay

## 2019-08-20 DIAGNOSIS — Z3A13 13 weeks gestation of pregnancy: Secondary | ICD-10-CM | POA: Diagnosis not present

## 2019-08-20 DIAGNOSIS — O285 Abnormal chromosomal and genetic finding on antenatal screening of mother: Secondary | ICD-10-CM | POA: Diagnosis not present

## 2019-08-20 DIAGNOSIS — Z315 Encounter for genetic counseling: Secondary | ICD-10-CM

## 2019-08-20 NOTE — Progress Notes (Signed)
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 [name] was accompanied to her appointment by her partner, [name].   Indication for genetic counseling - NIPS high risk for 22q11.2 deletion syndrome  Prenatal history  [name] is a [id number], 34 y.o. female. Her current pregnancy has completed [redacted]w[redacted]d(Estimated Date of Delivery: 02/25/20). [name] a 186year old daughter, [name] and a 131year old son, [name] with a previous partner. She also has a 570year old daughter, [name] with a past partner. Per records, she has a history of 4 elective terminations. The current pregnancy is the first for this couple.   [name]. DGossettdenied exposure to environmental toxins or chemical agents. She denied the use of alcohol. She reported smoking a half pack of cigarettes per day, which she has cut back from a pack and a half per day. Fentanyl use during this pregnancy has been documented in her records, though this was not discussed today. It has also been documented that [name] attempting to detox. She reported taking gabapentin and Subutex. She denied significant viral illnesses, fevers, and bleeding during the course of her pregnancy. Her medical and surgical histories were noncontributory.  Prenatal exposure to tobacco can increase the risk for placenta previa and placental abruption, preterm birth, low birth weight, oral clefts, stillbirth, and sudden infant death syndrome (SIDS). Prenatal exposure to nicotine is also associated with an increased likelihood of neurological disorder and asthma. The risk of pregnancy complications associated with cigarette exposure tends to increase with the amount of cigarettes a woman smokes. I praised [name]. DBobette [name] cutting back  on smoking and encouraged her to continue reducing the number of cigarettes smoked per day with the goal to eventually quit smoking altogether.  Long-term use of opioids such as fentanyl has been linked to poor fetal growth, preterm birth, and stillbirth and can cause neonatal abstinence syndrome (NAS). Some studies suggest that children with NAS are more likely to have developmental delays compared to children without NAS. However, long-term outcomes of children exposed to opioids during pregnancy are inconsistent. More research is needed to better understand the spectrum of possible outcomes related to opioid exposure during pregnancy.  Family History  A three generation pedigree was drafted and reviewed. The family history is remarkable for the following:  - [name]. Chamorro's daughter Beth Fillwas born with pyloric stenosis requiring surgery at 370months of age. Brothers of sisters with pyloric stenosis have a ~9.2% chance of having pyloric stenosis themselves. However, recurrence risks diminish rapidly for individuals beyond first-degree relatives. Given that [name]. Pikus's daughter will be a half sister to the current fetus, the risk of recurrence for him is less than 9.2%.  - [name]. DNooriand her partner TDorothea Oglehave a family history of developmental delays and learning difficulties. [name]. DMeuth her daughter [name] and a maternal uncle all have learning difficulties. We discussed that many times, learning difficulties and developmental delays are multifactorial in nature, occurring due to a combination of genetic and environmental factors that are difficult to identify. Learning disabilities and developmental delays can appear to run in families; thus, there is an increased chance that the couple's children could also experience learning difficulties or developmental delays of some kind. The couple understands that they should make the pediatrician aware of any concerns  they have about their children's  development.  - [name]. Villescas and her partner both have a family history of mental illness. We discussed that many forms of mental illness are also multifactorial in nature. Mental illness can appear to run in families; however, due to the multifactorial nature of mental illnesses, the recurrence risks are not well-established.  - [name]. Arrazola has a family history of cancer. Her mother had breast cancer two times, with her first diagnosis occurring at age 41. [name]. Sagona has a maternal aunt and maternal grandmother who both had breast cancer in their 71s. Though most cancers are thought to be sporadic or due to environmental factors, some families appear to have a strong predisposition to cancers. When considering a family history of cancer, we look for common types of cancer in multiple family members occurring at younger than typical ages. We discussed the option of meeting with a cancer genetic counselor to discuss any possible screening or testing options available. If [name]. Eaglin is concerned about the family history of cancer and would like to learn more about the familys chance for an inherited cancer syndrome, she or her healthcare provider may refer her to the [facility] [phone]).   - [name]. Summerfield has a maternal uncle with muscular dystrophy. [name]. Chrestman was not sure of the specific type of muscular dystrophy he has. I informed [name]. Ruppel that she had negative carrier screening for Duchenne/Becker muscular dystrophy and spinal muscular atrophy, which significantly decreases her chance of having a child with one of those conditions. However, without further information about the type of muscular dystrophy her uncle had, precise risk assessment is limited.  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 He is currently taking Keppra and Clonazepam. Given that his seizures began are likely related to benzodiazepine  withdrawal syndrome, the risk for epilepsy in the couple's children is likely not greatly elevated above population risk.  [name] has a brother with congenital hearing loss and Meniere's disease. We discussed that there are several possible explanations for his brother's hearing loss, including genetic conditions, errors in embryonal/fetal development, or environmental factors. Literature indicates that up to 80% of congenital hearing loss is caused by genetic factors. Of that 80%, 20% is syndromic and 80% is nonsyndromic. We briefly reviewed that there are various possible patterns of inheritance associated with hearing loss. I informed the couple that carrier screening for genes associated with autosomal recessive hearing loss is available if desired.   The remaining family histories were reviewed and found to be noncontributory for birth defects, intellectual disability, recurrent pregnancy loss, and known genetic conditions. The couple had limited information about some portions of their family histories; thus, risk assessment was limited.  The patient's ethnicity is [place] and [place]. The father of the pregnancy's ethnicity is [place] and [place]. The also reported Ashkenazi Jewish ancestry. Consanguinity was denied. Pedigree will be scanned under Media.  Discussion  NIPS result:  [name]. Karp was referred for genetic counseling as she had [facility] noninasive prenatal screening (NIPS) results that were high risk for 22q11.2 deletion syndrome. 22q11.2 deletion syndrome, also called DiGeorge syndrome or Velocardiofacial syndrome is caused by the deletion of genes on one end of chromosome 22 (the q11.2 region of the chromosome). Common features of this condition include heart defects, cleft palate, characteristic facial features, immune deficiency, and learning disabilities. Approximately 20% of individuals with 22q11.2 deletion syndrome are estimated to have autism spectrum disorders. Less  commonly, individuals with 22q11.2 deletion syndrome may have an autoimmune disease, growth hormone deficiency, hearing loss, and/or psychiatric illness. Symptoms may vary significantly from person to person, even among relatives. Most individuals with 22q11.2 deletion syndrome live into adulthood.   Approximately 93% of individuals with 22q11.2 deletion syndrome have a de novo deletion of the 22q11.2 region that occurred for the first time in them. Approximately 7% of individuals inherit the deletion from one parent who also carries the deletion of the 22q11.2 region. Some individuals are not diagnosed with the condition until they have a child that is more severely affected than them. 22q11.2 deletion syndrome is an autosomal dominant condition, meaning once a person has the condition they have a 50% (1 in 2) chance to pass on the chromosome 22 that has the deletion with each pregnancy. However, parents and children with 22q11.2 deletion syndrome can have very different features from one another, and many symptoms cannot be predicted in the prenatal period.  We reviewed that NIPS analyzes cell free DNA originating from the placenta that is found in the maternal blood circulation during pregnancy. This test can provide information regarding the presence or absence of extra fetal DNA for chromosomes 13, 18 and 21as well as the sex chromosomes. NIPS can also be used to estimate the risk for a 22q11.2 microdeletion. The reported detection rate is 90% for 22q11.2 deletion syndrome.However, it cannot be considered diagnostic. Positive predictive value (PPV) is the probability that a pregnancy with a positive test result is truly affected. The PPV reported bythe NIPS laboratory for 22q11.2 deletion syndrome in the current fetuswas estimated to be20%. The PPV calculator offered by the [employer] estimatedPPV for 22q11.2 deletion syndromein the  current fetus tobe2%. Thus, there is a2-20% chance that this could be a true positive result and an 80-98% chance that this result is a false positive.  BethDickersonwas counseled that there are several possible explanations for her high risk NIPS result. Firstly, the fetus could truly be affected by22q11.2 deletion syndrome. Secondly, the fetus could be mosaic for 22q11.2 deletion syndrome. Mosaicism occurs when not every cell in the body is genetically identical; some cells in the body may have a genetic change involving chromosome 22, whereas others may have normal chromosomes. A third possibility is that of confined placental mosaicism, or a result representative of the placenta only rather than the fetus. Finally, it is possible that this is a false positive result.  Testing options:  We reviewed several screening/testing options available to assess for 22q11.2 deletion syndrome in the current fetus. Firstly, [name] may opt to continue following the pregnancy via ultrasound. Ultrasound findings that can be associated with 22q11.2 deletion syndrome include congenital heart defects, cleft lip/palate, renal anomalies, skeletal anomalies, and polyhydramnios. However, [name] was counseled that not all fetuses with 22q11.2 deletion syndrome will demonstrate signs of the condition on ultrasound. Given that congenital heart defects are common findings of 22q11.2 deletion syndrome, it is recommended that [name] also have a fetal echocardiogram performed to assess for fetal cardiac defects.  [name] was also counseled regarding the option of diagnostic testing via chorionic villus sampling (CVS) or amniocentesis. We discussed the technical aspects of each procedure and quoted up to a 1 in 500 (0.2%) risk for spontaneous pregnancy loss or other adverse pregnancy outcomes as a result of either procedure. Cultured cells from either a placental or amniotic fluid sample allow for the  visualization of a  fetal karyotype, which can detect >99% of chromosomal aberrations. Chromosomal microarray can also be performed to identify smaller deletions or duplications of fetal chromosomal material, including a deletion of 22q11.2. However, we discussed that even if a fetus were confirmed to have 22q11.2 deletion syndrome on diagnostic testing, it is not possible to predict every symptom the child will have or how severely affected they will be during the prenatal period.    Lastly, [name]. Violante was made aware that she has the option to wait and pursue testing postnatally if clinically indicated.  Carrier screening results:  Finally, we reviewed that [name]. Tarter had Horizon carrier screening performed through [facility], which was negative for 14 conditions. Thus, her risk to be a carrier for these 14 conditions (listed separately in the laboratory report) has been reduced but not eliminated. This puts her pregnancies at significantly decreased risk of being affected by one of these conditions.  Plan:  [name]. Nienhuis declined diagnostic testing today. We scheduled her for an ultrasound in three weeks. She prefers to wait to make a decision about diagnostic testing until then. She informed me that she is leaning more towards not having diagnostic testing performed since it cannot give information about the exact features and severity of the condition an affected child would experience.  I provided the couple with several resources, including information about 22q11.2 deletion syndrome from [employer] (formerly [employer]) and from the organization [employer]. They confirmed that all of their questions had been answered to their satisfaction today.  I counseled [name]. Casalino regarding the above risks and available options. The approximate face-to-face time with the genetic counselor was 70 minutes.  In summary:  Discussed NIPS result   NIPS high risk for 22q11.2 deletion  syndrome  2-20% chance that this is a true positive result  Offered additional testing and screening  Will return for ultrasound in three weeks  Recommend fetal echocardiogram  Declined CVS today. Wants to wait for ultrasound to make decision about diagnostic testing  Reviewed carrier screening results  Negative for 14 conditions  Significantly reduces chance that she is a carrier and that she will have a child affected by one of those conditions  Reviewed family history concerns   Buelah Manis, [name], Counselling psychologist

## 2019-08-21 ENCOUNTER — Other Ambulatory Visit: Payer: Self-pay | Admitting: *Deleted

## 2019-08-21 DIAGNOSIS — O280 Abnormal hematological finding on antenatal screening of mother: Secondary | ICD-10-CM

## 2019-08-31 ENCOUNTER — Encounter: Payer: Medicaid Other | Admitting: Obstetrics and Gynecology

## 2019-09-06 ENCOUNTER — Encounter: Payer: Medicaid Other | Admitting: Obstetrics & Gynecology

## 2019-09-07 ENCOUNTER — Other Ambulatory Visit: Payer: Self-pay | Admitting: Obstetrics

## 2019-09-07 ENCOUNTER — Ambulatory Visit: Payer: Medicaid Other | Attending: Obstetrics and Gynecology

## 2019-09-07 ENCOUNTER — Ambulatory Visit: Payer: Medicaid Other | Admitting: *Deleted

## 2019-09-07 ENCOUNTER — Other Ambulatory Visit: Payer: Self-pay

## 2019-09-07 ENCOUNTER — Other Ambulatory Visit: Payer: Self-pay | Admitting: *Deleted

## 2019-09-07 ENCOUNTER — Encounter: Payer: Self-pay | Admitting: *Deleted

## 2019-09-07 VITALS — BP 102/59 | HR 78

## 2019-09-07 DIAGNOSIS — F1910 Other psychoactive substance abuse, uncomplicated: Secondary | ICD-10-CM

## 2019-09-07 DIAGNOSIS — O99322 Drug use complicating pregnancy, second trimester: Secondary | ICD-10-CM | POA: Diagnosis not present

## 2019-09-07 DIAGNOSIS — Z363 Encounter for antenatal screening for malformations: Secondary | ICD-10-CM

## 2019-09-07 DIAGNOSIS — O280 Abnormal hematological finding on antenatal screening of mother: Secondary | ICD-10-CM

## 2019-09-07 DIAGNOSIS — O09522 Supervision of elderly multigravida, second trimester: Secondary | ICD-10-CM

## 2019-09-07 DIAGNOSIS — O285 Abnormal chromosomal and genetic finding on antenatal screening of mother: Secondary | ICD-10-CM

## 2019-09-07 DIAGNOSIS — Z3A15 15 weeks gestation of pregnancy: Secondary | ICD-10-CM

## 2019-09-17 ENCOUNTER — Encounter: Payer: Medicaid Other | Admitting: Obstetrics and Gynecology

## 2019-09-19 ENCOUNTER — Encounter: Payer: Self-pay | Admitting: Obstetrics and Gynecology

## 2019-09-19 ENCOUNTER — Other Ambulatory Visit: Payer: Self-pay

## 2019-09-19 ENCOUNTER — Ambulatory Visit (INDEPENDENT_AMBULATORY_CARE_PROVIDER_SITE_OTHER): Payer: Medicaid Other | Admitting: Obstetrics and Gynecology

## 2019-09-19 ENCOUNTER — Other Ambulatory Visit (HOSPITAL_COMMUNITY)
Admission: RE | Admit: 2019-09-19 | Discharge: 2019-09-19 | Disposition: A | Discharge status: Home / Self Care | Payer: Medicaid Other | Source: Ambulatory Visit | Attending: Obstetrics and Gynecology | Admitting: Obstetrics and Gynecology

## 2019-09-19 VITALS — BP 115/73 | HR 73 | Wt 136.8 lb

## 2019-09-19 DIAGNOSIS — B009 Herpesviral infection, unspecified: Secondary | ICD-10-CM

## 2019-09-19 DIAGNOSIS — Z3480 Encounter for supervision of other normal pregnancy, unspecified trimester: Secondary | ICD-10-CM

## 2019-09-19 DIAGNOSIS — F1121 Opioid dependence, in remission: Secondary | ICD-10-CM

## 2019-09-19 DIAGNOSIS — O285 Abnormal chromosomal and genetic finding on antenatal screening of mother: Secondary | ICD-10-CM

## 2019-09-19 NOTE — Patient Instructions (Signed)

## 2019-09-19 NOTE — Progress Notes (Signed)
Pt is here for ROB, [redacted]w[redacted]d.

## 2019-09-19 NOTE — Progress Notes (Signed)
Subjective:  [name] is a 34 y.o. [phone] at [redacted]w[redacted]d being seen today for ongoing prenatal care.  She is currently monitored for the following issues for this high-risk pregnancy and has Bipolar I disorder, current or most recent episode depressed, with psychotic features (HCC); Cigarette nicotine dependence without complication; Herpes simplex; MDD (major depressive disorder); Opioid dependence in remission [facility]); PTSD (post-traumatic stress disorder); Encounter for supervision of normal pregnancy, unspecified, unspecified trimester; H/O gonorrhea; Cervical high risk HPV (human papillomavirus) test positive; and Abnormal genetic test during pregnancy on their problem list.  Patient reports vaginal irritation.  Contractions: Not present. Vag. Bleeding: None.  Movement: Present. Denies leaking of fluid.   The following portions of the patient's history were reviewed and updated as appropriate: allergies, current medications, past family history, past medical history, past social history, past surgical history and problem list. Problem list updated.  Objective:   Vitals:   09/19/19 0819  BP: 115/73  Pulse: 73  Weight: 136 lb 12.8 oz (62.1 kg)    Fetal Status: Fetal Heart Rate (bpm): 150   Movement: Present     General:  Alert, oriented and cooperative. Patient is in no acute distress.  Skin: Skin is warm and dry. No rash noted.   Cardiovascular: Normal heart rate noted  Respiratory: Normal respiratory effort, no problems with respiration noted  Abdomen: Soft, gravid, appropriate for gestational age. Pain/Pressure: Absent     Pelvic:  Cervical exam deferred        Extremities: Normal range of motion.  Edema: None  Mental Status: Normal mood and affect. Normal behavior. Normal judgment and thought content.   Urinalysis:      Assessment and Plan:  Pregnancy: [id number] at [redacted]w[redacted]d  1. Supervision of other normal pregnancy, antepartum Stable Self swab today Anatomy scan next  month  2. Opioid dependence in remission (HCC) Stable on Subutex  3. Herpes simplex Will need PP at 36 weeks  4. Abnormal genetic test during pregnancy [facility] pt  Preterm labor symptoms and general obstetric precautions including but not limited to vaginal bleeding, contractions, leaking of fluid and fetal movement were reviewed in detail with the patient. Please refer to After Visit Summary for other counseling recommendations.  Return in about 4 weeks (around 10/17/2019) for OB visit, virtual.   Hermina Staggers, MD

## 2019-09-20 LAB — CERVICOVAGINAL ANCILLARY ONLY
Bacterial Vaginitis (gardnerella): NEGATIVE
Candida Glabrata: NEGATIVE
Candida Vaginitis: NEGATIVE
Chlamydia: NEGATIVE
Comment: NEGATIVE
Comment: NEGATIVE
Comment: NEGATIVE
Comment: NEGATIVE
Comment: NEGATIVE
Comment: NORMAL
Neisseria Gonorrhea: NEGATIVE
Trichomonas: NEGATIVE

## 2019-09-21 LAB — AFP, SERUM, OPEN SPINA BIFIDA
AFP MoM: 0.8
AFP Value: 34.9 ng/mL
Gest. Age on Collection Date: 17.3 weeks
Maternal Age At EDD: 36.1 yr
OSBR Risk 1 IN: 10000
Test Results:: NEGATIVE
Weight: 136 [lb_av]

## 2019-09-24 NOTE — Progress Notes (Signed)
Office Visit Note  Patient: [name]             Date of Birth: 01/30/1985           MRN: [id number]             PCP: Jolinda Croak, MD Referring: Beverley Fiedler, * Visit Date: 10/08/2019 Occupation: @GUAROCC @  Subjective:  Neck pain.   History of Present Illness: [name] is a 34 y.o. female seen in consultation per request of her PCP for evaluation of joint pain and positive ANA.  According the patient about 3 months ago she slept on the couch and woke up with the neck spasm and pain radiating to her left arm and tingling.  She says the pain eventually eased off and she was asymptomatic for about 2 months.  Recently she has been experiencing neck is stiffness which goes to her left shoulder again.  She states her partner massages her neck which helps her temporarily and then the symptoms come back.  None of the other joints are painful.  She is about [redacted] weeks pregnant and has some lower back discomfort.  She also states that she had breast reduction surgery in the past due to upper back pain.  She also has some scoliosis per patient.  None of the other joints are painful.  There is no history of oral ulcers, nasal ulcers, malar rash, photosensitivity, Raynaud's phenomenon, lymphadenopathy or inflammatory arthritis.  There is no family history of autoimmune disease.  Gravida 4, para 3, miscarriages 0.  Activities of Daily Living:  Patient reports morning stiffness for 0  minutes.   Patient Reports nocturnal pain.  Difficulty dressing/grooming: Denies Difficulty climbing stairs: Denies Difficulty getting out of chair: Denies Difficulty using hands for taps, buttons, cutlery, and/or writing: Denies  Review of Systems  Constitutional: Negative for fatigue.  HENT: Negative for mouth sores, mouth dryness and nose dryness.   Eyes: Negative for itching and dryness.  Respiratory: Negative for shortness of breath and difficulty breathing.   Cardiovascular:  Negative for chest pain and palpitations.  Gastrointestinal: Positive for constipation. Negative for blood in stool and diarrhea.  Endocrine: Negative for increased urination.  Genitourinary: Negative for difficulty urinating.  Musculoskeletal: Positive for myalgias, muscle tenderness and myalgias. Negative for arthralgias, joint pain, joint swelling and morning stiffness.  Skin: Negative for color change, rash and redness.  Allergic/Immunologic: Negative for susceptible to infections.  Neurological: Positive for numbness. Negative for dizziness, headaches, memory loss and weakness.  Hematological: Negative for bruising/bleeding tendency.  Psychiatric/Behavioral: Positive for depressed mood and sleep disturbance. Negative for confusion. The patient is nervous/anxious.     PMFS History:  Patient Active Problem List   Diagnosis Date Noted  . Abnormal genetic test during pregnancy 09/19/2019  . 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 H/O gonorrhea   . Encounter for supervision of normal pregnancy, unspecified, unspecified trimester 07/19/2019  . Cigarette nicotine dependence without complication 44/92/0100  . MDD (major depressive disorder) 04/11/2018  . Herpes simplex 03/09/2018  . Opioid dependence in remission ([place]) 10/17/2017  . PTSD (post-traumatic stress disorder) 10/17/2017  . Bipolar I disorder, current or most recent episode depressed, with psychotic features ([place]) 09/11/2017    Past Medical History:  Diagnosis Date  . Depression   . Drug addiction in remission ([place])   . H/O gonorrhea   . HSV-2 infection   . Pregnancy complicated by subutex maintenance, antepartum ([place])   . SVD (spontaneous  vaginal delivery) 04/17/2014    Family History  Problem Relation Age of Onset  . Breast cancer Mother   . Stroke Mother   . Breast cancer Maternal Grandmother   . Depression Sister    Past Surgical History:  Procedure Laterality Date  . BREAST  BIOPSY Right   . BREAST SURGERY     reduction   Social History   Social History Narrative  . Not on file    There is no immunization history on file for this patient.   Objective: Vital Signs: BP 104/68 (BP Location: Right Arm, Patient Position: Sitting, Cuff Size: Normal)   Pulse 79   Resp 13   Ht 5' 4"  (1.626 m)   Wt 145 lb (65.8 kg)   LMP 04/30/2019   BMI 24.89 kg/m    Physical Exam Vitals and nursing note reviewed.  Constitutional:      Appearance: She is well-developed.  HENT:     Head: Normocephalic and atraumatic.  Eyes:     Conjunctiva/sclera: Conjunctivae normal.  Cardiovascular:     Rate and Rhythm: Normal rate and regular rhythm.     Heart sounds: Normal heart sounds.  Pulmonary:     Effort: Pulmonary effort is normal.     Breath sounds: Normal breath sounds.  Abdominal:     General: Bowel sounds are normal.     Palpations: Abdomen is soft.  Musculoskeletal:     Cervical back: Normal range of motion.  Lymphadenopathy:     Cervical: No cervical adenopathy.  Skin:    General: Skin is warm and dry.     Capillary Refill: Capillary refill takes less than 2 seconds.  Neurological:     Mental Status: She is alert and oriented to person, place, and time.  Psychiatric:        Behavior: Behavior normal.      Musculoskeletal Exam: She has good range of motion of her cervical spine.  She has some stiffness with the lateral rotation.  Thoracic and lumbar spine with good range of motion.  Bilateral shoulder joints with good range of motion.  Elbow joints, wrist joints, MCPs PIPs and DIPs with good range of motion with no synovitis.  Hip joints, knee joints, ankles, MTPs and PIPs with good range of motion with no synovitis.  CDAI Exam: CDAI Score: -- Patient Global: --; Provider Global: -- Swollen: --; Tender: -- Joint Exam 10/08/2019   No joint exam has been documented for this visit   There is currently no information documented on the homunculus. Go to  the Rheumatology activity and complete the homunculus joint exam.  Investigation: No additional findings.  Imaging: [place] MFM OB FOLLOW UP  Result Date: 10/02/2019 ----------------------------------------------------------------------  OBSTETRICS REPORT                       (Signed Final 10/02/2019 12:34 pm) ---------------------------------------------------------------------- Patient Info  ID #:       [id number]                          D.O.B.:  30-Nov-1985 (33 yrs)  Name:       [name]              Visit Date: 10/02/2019 11:04 am ---------------------------------------------------------------------- Performed By  Attending:        Tama High MD        Ref. Address:     [address]  [address]                                                             [address] [place]                                                             [place]  Performed By:     Georgie Chard        [facility]                                                             [place] [facility]                                                             [facility]  Referred By:      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 Cord Insertion:      Visualized  Amniotic Fluid  AFI FV:      Within normal limits                              Largest Pocket(cm)                              7.03 ---------------------------------------------------------------------- Biometry  BPD:      44.6  mm     G. Age:  19w 3d         65  %    CI:        72.88   %    70 - 86                                                          FL/HC:      17.7   %    16.1 - 18.3  HC:      166.1  mm     G. Age:  19w 2d         51  %    HC/AC:      1.11        1.09 - 1.39  AC:      149.9  mm     G. Age:  20w 2d         80  %    FL/BPD:     65.9   %  FL:       29.4  mm     G. Age:  19w 0d         40  %    FL/AC:      19.6   %    20 - 24  HUM:      29.3  mm     G. Age:  19w 4d         61  %  Est. FW:     305  gm    0 lb 11 oz      76  % ---------------------------------------------------------------------- Gestational Age  U/S Today:     19w 4d                                        EDD:   02/22/20  Best:  19w 1d     Det. By:  U/S C R L  (07/19/19)    EDD:   02/25/20 ---------------------------------------------------------------------- Anatomy  Cranium:               Appears normal         Aortic Arch:            Appears normal  Cavum:                 Appears normal         Ductal Arch:            Appears normal  Ventricles:            Appears normal         Diaphragm:              Appears normal  Choroid Plexus:        Appears normal          Stomach:                Appears normal, left                                                                        sided  Cerebellum:            Previously seen        Abdomen:                Appears normal  Posterior Fossa:       Previously seen        Abdominal Wall:         Previously seen  Nuchal Fold:           Not applicable (>18    Cord Vessels:           Previously seen                         wks GA)  Face:                  Appears normal         Kidneys:                Appear normal                         (orbits and profile)  Lips:                  Appears normal         Bladder:                Appears normal  Thoracic:              Appears normal         Spine:                  Previously seen  Heart:                 Appears normal         Upper Extremities:      Previously seen                         (  4CH, axis, and                         situs)  RVOT:                  Appears normal         Lower Extremities:      Previously seen  LVOT:                  Appears normal  Other:  Fetus appears to be a female. Hands and feet visualized. 5th digit          visualized. Technically difficult due to fetal position. Technically          difficult due to fetal position. ---------------------------------------------------------------------- Cervix Uterus Adnexa  Cervix  Length:           3.64  cm.  Normal appearance by transabdominal scan. ---------------------------------------------------------------------- Impression  Patient returns for fetal growth assessment.  She is on  Subutex maintenance and also reports taking gabapentin 300  mg once daily.  On cell free fetal DNA screening, the risk for  22 q 11.2 deletion was increased.  Patient had fetal  echocardiography today and was reassured of normal  cardiac anatomy.Patient opted not to have amniocentesis.  Amniotic fluid is normal and good fetal activity is seen .Fetal  growth is appropriate for gestational age .  Cardiac anatomy  appears normal.  ---------------------------------------------------------------------- Recommendations  -An appointment was made for her to return in 6 weeks for  fetal growth assessment. ----------------------------------------------------------------------                  Tama High, MD Electronically Signed Final Report   10/02/2019 12:34 pm ----------------------------------------------------------------------   Recent Labs: Lab Results  Component Value Date   WBC 7.6 08/03/2019   HGB 11.8 08/03/2019   PLT 302 08/03/2019   NA 140 09/15/2017   K 3.8 09/15/2017   CL 104 09/15/2017   CO2 26 09/15/2017   GLUCOSE 106 (H) 09/15/2017   BUN 17 09/15/2017   CREATININE 0.88 09/15/2017   BILITOT 0.7 09/15/2017   ALKPHOS 54 09/15/2017   AST 16 09/15/2017   ALT 13 09/15/2017   PROT 8.6 (H) 09/15/2017   ALBUMIN 5.0 09/15/2017   CALCIUM 9.7 09/15/2017   GFRAA >60 09/15/2017    Speciality Comments: No specialty comments available.  Procedures:  No procedures performed Allergies: Penicillins   Assessment / Plan:     Visit Diagnoses: Positive ANA (antinuclear antibody) - 02/05/19: ANA+, smith-, Ro-, La-, dsDNA 1, RNP 3.4, RF 13, uric acid 5.1, ESR 33.  She had labs done in January which showed positive ANA and positive RNP.  We had detailed discussion regarding the significance of positive ANA and positive RNP.  There is no history of oral ulcers, nasal ulcers, malar rash, photosensitivity, Raynaud's phenomenon, inflammatory arthritis, lymphadenopathy, sicca symptoms.  She has no clinical features of autoimmune disease.  There is no family history of autoimmune disease.  I will obtainAVISE labs.  Neck pain-she complains of neck pain and stiffness.  She has discomfort with left lateral rotation.  She has some trapezius spasm.  She complains of some radiculopathy and numbness in her left shoulder at times.  We will not be able to obtain any x-rays that she is pregnant today.  I would like to see response to  physical therapy.  If she has persistent symptoms after the baby is born that we can obtain  x-ray of the cervical spine.  Left shoulder joint was in good range of motion.  I will refer her to physical therapy for neck strain.  I have also given her a handout on neck exercises.  She states she has had neck and thoracic pain in the past for which she had breast reduction surgery.  Chronic lower back pain without sciatica-patient states she has been experiencing some lower back pain but she relates it to being pregnant.  [redacted] weeks gestation of pregnancy-followed by OB/GYN.  Vitamin D deficiency-she is on supplement.  Hypercholesterolemia  Bipolar I disorder, current or most recent episode depressed, with psychotic features ([facility])-followed by psychiatrist.  PTSD (post-traumatic stress disorder)  Cigarette nicotine dependence without complication - 5 cigarettes a day for 20 years.  Smoking cessation was discussed at length.  I also advised to seek help with a counselor who can help her with smoking cessation.  Opioid dependence in remission ([place]) - Use of Percocet in the past.  History of heroin use  Herpes simplex - genital  Abnormal genetic test during pregnancy  Educated about COVID-19 virus infection-she has not been immunized yet.  Have advised her to discuss with OB regarding immunization.  Use of mask, social distancing and hand hygiene was emphasized.  Orders: Orders Placed This Encounter  Procedures  . Ambulatory referral to Physical Therapy   No orders of the defined types were placed in this encounter.    Follow-Up Instructions: Return for Positive ANA, neck pain.   Bo Merino, MD  Note - This record has been created using Editor, commissioning.  Chart creation errors have been sought, but may not always  have been located. Such creation errors do not reflect on  the standard of medical care.

## 2019-10-02 ENCOUNTER — Ambulatory Visit: Payer: Medicaid Other | Admitting: *Deleted

## 2019-10-02 ENCOUNTER — Encounter: Payer: Self-pay | Admitting: *Deleted

## 2019-10-02 ENCOUNTER — Ambulatory Visit: Payer: Medicaid Other | Attending: Obstetrics and Gynecology

## 2019-10-02 ENCOUNTER — Other Ambulatory Visit: Payer: Self-pay

## 2019-10-02 ENCOUNTER — Other Ambulatory Visit: Payer: Self-pay | Admitting: *Deleted

## 2019-10-02 DIAGNOSIS — O99320 Drug use complicating pregnancy, unspecified trimester: Secondary | ICD-10-CM

## 2019-10-02 DIAGNOSIS — Z3480 Encounter for supervision of other normal pregnancy, unspecified trimester: Secondary | ICD-10-CM

## 2019-10-02 DIAGNOSIS — O280 Abnormal hematological finding on antenatal screening of mother: Secondary | ICD-10-CM | POA: Insufficient documentation

## 2019-10-02 DIAGNOSIS — O285 Abnormal chromosomal and genetic finding on antenatal screening of mother: Secondary | ICD-10-CM

## 2019-10-02 DIAGNOSIS — O99322 Drug use complicating pregnancy, second trimester: Secondary | ICD-10-CM

## 2019-10-02 DIAGNOSIS — Z363 Encounter for antenatal screening for malformations: Secondary | ICD-10-CM

## 2019-10-02 DIAGNOSIS — F1190 Opioid use, unspecified, uncomplicated: Secondary | ICD-10-CM

## 2019-10-02 DIAGNOSIS — Z3A19 19 weeks gestation of pregnancy: Secondary | ICD-10-CM

## 2019-10-02 DIAGNOSIS — O09522 Supervision of elderly multigravida, second trimester: Secondary | ICD-10-CM

## 2019-10-08 ENCOUNTER — Ambulatory Visit (INDEPENDENT_AMBULATORY_CARE_PROVIDER_SITE_OTHER): Payer: Medicaid Other | Admitting: Rheumatology

## 2019-10-08 ENCOUNTER — Other Ambulatory Visit: Payer: Self-pay

## 2019-10-08 ENCOUNTER — Encounter: Payer: Self-pay | Admitting: Rheumatology

## 2019-10-08 VITALS — BP 104/68 | HR 79 | Resp 13 | Ht 64.0 in | Wt 145.0 lb

## 2019-10-08 DIAGNOSIS — F17210 Nicotine dependence, cigarettes, uncomplicated: Secondary | ICD-10-CM

## 2019-10-08 DIAGNOSIS — F315 Bipolar disorder, current episode depressed, severe, with psychotic features: Secondary | ICD-10-CM

## 2019-10-08 DIAGNOSIS — F4310 Post-traumatic stress disorder, unspecified: Secondary | ICD-10-CM

## 2019-10-08 DIAGNOSIS — E7800 Pure hypercholesterolemia, unspecified: Secondary | ICD-10-CM

## 2019-10-08 DIAGNOSIS — E559 Vitamin D deficiency, unspecified: Secondary | ICD-10-CM

## 2019-10-08 DIAGNOSIS — Z3A20 20 weeks gestation of pregnancy: Secondary | ICD-10-CM

## 2019-10-08 DIAGNOSIS — F1121 Opioid dependence, in remission: Secondary | ICD-10-CM

## 2019-10-08 DIAGNOSIS — R7689 Other specified abnormal immunological findings in serum: Secondary | ICD-10-CM

## 2019-10-08 DIAGNOSIS — M542 Cervicalgia: Secondary | ICD-10-CM

## 2019-10-08 DIAGNOSIS — G8929 Other chronic pain: Secondary | ICD-10-CM

## 2019-10-08 DIAGNOSIS — Z87898 Personal history of other specified conditions: Secondary | ICD-10-CM

## 2019-10-08 DIAGNOSIS — B009 Herpesviral infection, unspecified: Secondary | ICD-10-CM

## 2019-10-08 DIAGNOSIS — Z7189 Other specified counseling: Secondary | ICD-10-CM

## 2019-10-08 DIAGNOSIS — F1191 Opioid use, unspecified, in remission: Secondary | ICD-10-CM

## 2019-10-08 DIAGNOSIS — M545 Low back pain: Secondary | ICD-10-CM

## 2019-10-08 DIAGNOSIS — O285 Abnormal chromosomal and genetic finding on antenatal screening of mother: Secondary | ICD-10-CM

## 2019-10-08 DIAGNOSIS — R768 Other specified abnormal immunological findings in serum: Secondary | ICD-10-CM | POA: Diagnosis not present

## 2019-10-08 NOTE — Patient Instructions (Signed)

## 2019-10-17 ENCOUNTER — Other Ambulatory Visit: Payer: Self-pay

## 2019-10-17 ENCOUNTER — Telehealth (INDEPENDENT_AMBULATORY_CARE_PROVIDER_SITE_OTHER): Payer: Medicaid Other | Admitting: Obstetrics and Gynecology

## 2019-10-17 ENCOUNTER — Telehealth: Payer: Medicaid Other | Admitting: Obstetrics and Gynecology

## 2019-10-17 ENCOUNTER — Telehealth: Payer: Self-pay

## 2019-10-17 DIAGNOSIS — Z3480 Encounter for supervision of other normal pregnancy, unspecified trimester: Secondary | ICD-10-CM

## 2019-10-17 NOTE — Progress Notes (Signed)
error 

## 2019-10-17 NOTE — Telephone Encounter (Signed)
Patient called reporting intermittent pain in her lower abdomen. Pt denies any other symptoms such as HA, blurry vision, or swelling. Pt reports her BP has been normal although she is not able to check it at the moment. Pt denies contractions, LOF, and reports good fetal movement. I advised pt this is likely round ligament pain and she may take tylenol as directed for pain. Advised pt to rest as much as she can and to go to hospital for evaluation if having signs of pre term labor or if pain gets worse. Pt voices understanding.

## 2019-10-17 NOTE — Progress Notes (Signed)
Patient did not keep virtual appointment and plans to reschedule

## 2019-10-18 NOTE — Progress Notes (Deleted)
Office Visit Note  Patient: [name]             Date of Birth: 05-23-1985           MRN: [id number]             PCP: Jolinda Croak, MD Referring: Beverley Fiedler, * Visit Date: 10/30/2019 Occupation: @GUAROCC @  Subjective:  No chief complaint on file.   History of Present Illness: [name] is a 34 y.o. female ***   Activities of Daily Living:  Patient reports morning stiffness for *** {minute/hour:19697}.   Patient {ACTIONS;DENIES/REPORTS:21021675::"Denies"} nocturnal pain.  Difficulty dressing/grooming: {ACTIONS;DENIES/REPORTS:21021675::"Denies"} Difficulty climbing stairs: {ACTIONS;DENIES/REPORTS:21021675::"Denies"} Difficulty getting out of chair: {ACTIONS;DENIES/REPORTS:21021675::"Denies"} Difficulty using hands for taps, buttons, cutlery, and/or writing: {ACTIONS;DENIES/REPORTS:21021675::"Denies"}  No Rheumatology ROS completed.   PMFS History:  Patient Active Problem List   Diagnosis Date Noted  . Abnormal genetic test during pregnancy 09/19/2019  . 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 H/O gonorrhea   . Encounter for supervision of normal pregnancy, unspecified, unspecified trimester 07/19/2019  . Cigarette nicotine dependence without complication 84/16/6063  . MDD (major depressive disorder) 04/11/2018  . Herpes simplex 03/09/2018  . Opioid dependence in remission ([place]) 10/17/2017  . PTSD (post-traumatic stress disorder) 10/17/2017  . Bipolar I disorder, current or most recent episode depressed, with psychotic features ([place]) 09/11/2017    Past Medical History:  Diagnosis Date  . Depression   . Drug addiction in remission ([place])   . H/O gonorrhea   . HSV-2 infection   . Pregnancy complicated by subutex maintenance, antepartum ([place])   . SVD (spontaneous vaginal delivery) 04/17/2014    Family History  Problem Relation Age of Onset  . Breast cancer Mother   . Stroke Mother   . Breast cancer  Maternal Grandmother   . Depression Sister    Past Surgical History:  Procedure Laterality Date  . BREAST BIOPSY Right   . BREAST SURGERY     reduction   Social History   Social History Narrative  . Not on file    There is no immunization history on file for this patient.   Objective: Vital Signs: LMP 04/30/2019    Physical Exam   Musculoskeletal Exam: ***  CDAI Exam: CDAI Score: -- Patient Global: --; Provider Global: -- Swollen: --; Tender: -- Joint Exam 10/30/2019   No joint exam has been documented for this visit   There is currently no information documented on the homunculus. Go to the Rheumatology activity and complete the homunculus joint exam.  Investigation: No additional findings.  Imaging: [place] MFM OB FOLLOW UP  Result Date: 10/02/2019 ----------------------------------------------------------------------  OBSTETRICS REPORT                       (Signed Final 10/02/2019 12:34 pm) ---------------------------------------------------------------------- Patient Info  ID #:       [id number]                          D.O.B.:  24-May-1985 (33 yrs)  Name:       [name]              Visit Date: 10/02/2019 11:04 am ---------------------------------------------------------------------- Performed By  Attending:        Tama High MD        Ref. Address:     [address]  [address]                                                             [place]                                                             [place]  Performed By:     Georgie Chard        [facility]                                                             [place] [facility]                                                             [facility]  Referred By:      [facility]  ---------------------------------------------------------------------- Orders  #  Description                           Code        Ordered By  1  [place] MFM OB FOLLOW UP                   11021.11    Tama High ----------------------------------------------------------------------  #  Order #                     Accession #                Episode #  1  [id number]                   [id number]                 [id number] ---------------------------------------------------------------------- Indications  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 Cord Insertion:      Visualized  Amniotic Fluid  AFI FV:      Within normal limits                              Largest Pocket(cm)                              7.03 ---------------------------------------------------------------------- Biometry  BPD:      44.6  mm     G. Age:  19w 3d         65  %    CI:        72.88   %    70 - 86                                                          FL/HC:      17.7   %    16.1 - 18.3  HC:      166.1  mm     G. Age:  19w 2d         51  %    HC/AC:      1.11        1.09 - 1.39  AC:      149.9  mm     G. Age:  20w 2d         80  %    FL/BPD:     65.9   %  FL:       29.4  mm     G. Age:  19w 0d         40  %    FL/AC:      19.6   %    20 - 24  HUM:      29.3  mm     G. Age:  19w 4d         61  %  Est. FW:     305  gm    0 lb 11  oz      76  % ---------------------------------------------------------------------- Gestational Age  U/S Today:     19w 4d                                        EDD:   02/22/20  Best:  19w 1d     Det. By:  U/S C R L  (07/19/19)    EDD:   02/25/20 ---------------------------------------------------------------------- Anatomy  Cranium:               Appears normal         Aortic Arch:            Appears normal  Cavum:                 Appears normal         Ductal Arch:            Appears normal  Ventricles:            Appears normal         Diaphragm:              Appears normal  Choroid Plexus:        Appears normal         Stomach:                Appears normal, left                                                                        sided  Cerebellum:            Previously seen        Abdomen:                Appears normal  Posterior Fossa:       Previously seen        Abdominal Wall:         Previously seen  Nuchal Fold:           Not applicable (>59    Cord Vessels:           Previously seen                         wks GA)  Face:                  Appears normal         Kidneys:                Appear normal                         (orbits and profile)  Lips:                  Appears normal         Bladder:                Appears normal  Thoracic:              Appears normal         Spine:                  Previously seen  Heart:                 Appears normal         Upper Extremities:      Previously seen                         (  4CH, axis, and                         situs)  RVOT:                  Appears normal         Lower Extremities:      Previously seen  LVOT:                  Appears normal  Other:  Fetus appears to be a female. Hands and feet visualized. 5th digit          visualized. Technically difficult due to fetal position. Technically          difficult due to fetal position. ---------------------------------------------------------------------- Cervix Uterus Adnexa  Cervix  Length:            3.64  cm.  Normal appearance by transabdominal scan. ---------------------------------------------------------------------- Impression  Patient returns for fetal growth assessment.  She is on  Subutex maintenance and also reports taking gabapentin 300  mg once daily.  On cell free fetal DNA screening, the risk for  22 q 11.2 deletion was increased.  Patient had fetal  echocardiography today and was reassured of normal  cardiac anatomy.Patient opted not to have amniocentesis.  Amniotic fluid is normal and good fetal activity is seen .Fetal  growth is appropriate for gestational age .  Cardiac anatomy  appears normal. ---------------------------------------------------------------------- Recommendations  -An appointment was made for her to return in 6 weeks for  fetal growth assessment. ----------------------------------------------------------------------                  Tama High, MD Electronically Signed Final Report   10/02/2019 12:34 pm ----------------------------------------------------------------------   Recent Labs: Lab Results  Component Value Date   WBC 7.6 08/03/2019   HGB 11.8 08/03/2019   PLT 302 08/03/2019   NA 140 09/15/2017   K 3.8 09/15/2017   CL 104 09/15/2017   CO2 26 09/15/2017   GLUCOSE 106 (H) 09/15/2017   BUN 17 09/15/2017   CREATININE 0.88 09/15/2017   BILITOT 0.7 09/15/2017   ALKPHOS 54 09/15/2017   AST 16 09/15/2017   ALT 13 09/15/2017   PROT 8.6 (H) 09/15/2017   ALBUMIN 5.0 09/15/2017   CALCIUM 9.7 09/15/2017   GFRAA >60 09/15/2017   02/05/19: ANA+, smith-, Ro-, La-, dsDNA 1, RNP 3.4, RF 13, uric acid 5.1, ESR 33.  Speciality Comments: No specialty comments available.  Procedures:  No procedures performed Allergies: Penicillins   Assessment / Plan:     Visit Diagnoses: No diagnosis found.  Orders: No orders of the defined types were placed in this encounter.  No orders of the defined types were placed in this encounter.   Face-to-face time  spent with patient was *** minutes. Greater than 50% of time was spent in counseling and coordination of care.  Follow-Up Instructions: No follow-ups on file.   Bo Merino, MD  Note - This record has been created using Editor, commissioning.  Chart creation errors have been sought, but may not always  have been located. Such creation errors do not reflect on  the standard of medical care.

## 2019-10-25 ENCOUNTER — Telehealth (INDEPENDENT_AMBULATORY_CARE_PROVIDER_SITE_OTHER): Payer: Medicaid Other | Admitting: Obstetrics & Gynecology

## 2019-10-25 VITALS — BP 96/59 | HR 89

## 2019-10-25 DIAGNOSIS — Z3A22 22 weeks gestation of pregnancy: Secondary | ICD-10-CM

## 2019-10-25 DIAGNOSIS — Z3480 Encounter for supervision of other normal pregnancy, unspecified trimester: Secondary | ICD-10-CM

## 2019-10-25 DIAGNOSIS — O285 Abnormal chromosomal and genetic finding on antenatal screening of mother: Secondary | ICD-10-CM

## 2019-10-25 MED ORDER — DOCUSATE SODIUM 100 MG PO CAPS: 100.0000 mg | ORAL_CAPSULE | Freq: Two times a day (BID) | ORAL | 2 refills | Status: DC | PRN

## 2019-10-25 NOTE — Progress Notes (Signed)
Pt is on the phone preparing for virtual visit with provider. [redacted]w[redacted]d

## 2019-10-25 NOTE — Patient Instructions (Signed)

## 2019-10-25 NOTE — Progress Notes (Signed)
° °  TELEHEALTH OBSTETRICS VISIT ENCOUNTER NOTE  I connected with [name] on 10/25/19 at  9:30 AM EDT by telephone at home and verified that I am speaking with the correct person using two identifiers.   I discussed the limitations, risks, security and privacy concerns of performing an evaluation and management service by telephone and the availability of in person appointments. I also discussed with the patient that there may be a patient responsible charge related to this service. The patient expressed understanding and agreed to proceed.  Subjective:  [name] is a 34 y.o. [id number] at [redacted]w[redacted]d being followed for ongoing prenatal care.  She is currently monitored for the following issues for this high-risk pregnancy and has Bipolar I disorder, current or most recent episode depressed, with psychotic features (HCC); Cigarette nicotine dependence without complication; Herpes simplex; MDD (major depressive disorder); Opioid dependence in remission [facility]); PTSD (post-traumatic stress disorder); Encounter for supervision of normal pregnancy, unspecified, unspecified trimester; H/O gonorrhea; Cervical high risk HPV (human papillomavirus) test positive; and Abnormal genetic test during pregnancy on their problem list.  Patient reports no complaints. Reports fetal movement. Denies any contractions, bleeding or leaking of fluid.   The following portions of the patient's history were reviewed and updated as appropriate: allergies, current medications, past family history, past medical history, past social history, past surgical history and problem list.   Objective:   General:  Alert, oriented and cooperative.   Mental Status: Normal mood and affect perceived. Normal judgment and thought content.  Rest of physical exam deferred due to type of encounter  Assessment and Plan:  Pregnancy: [id number] at [redacted]w[redacted]d 1. Supervision of other normal pregnancy, antepartum constipation - docusate sodium  (COLACE) 100 MG capsule; Take 1 capsule (100 mg total) by mouth 2 (two) times daily as needed.  Dispense: 60 capsule; Refill: 2  2. Abnormal genetic test during pregnancy F/u with MFM, had nl echo  Preterm labor symptoms and general obstetric precautions including but not limited to vaginal bleeding, contractions, leaking of fluid and fetal movement were reviewed in detail with the patient.  I discussed the assessment and treatment plan with the patient. The patient was provided an opportunity to ask questions and all were answered. The patient agreed with the plan and demonstrated an understanding of the instructions. The patient was advised to call back or seek an in-person office evaluation/go to MAU at [facility] for any urgent or concerning symptoms. Please refer to After Visit Summary for other counseling recommendations.   I provided 12 minutes of non-face-to-face time during this encounter.  Return in about 4 weeks (around 11/22/2019) for 2 hr GTT.  Future Appointments  Date Time Provider Department Center  10/30/2019 10:15 AM Pollyann Savoy, MD [facility] None  11/13/2019  8:30 AM [facility] NURSE [facility]  11/13/2019  8:45 AM [facility] US4 [facility]    Scheryl Darter, MD Center for [facility], [facility]

## 2019-10-29 ENCOUNTER — Telehealth: Payer: Self-pay

## 2019-10-29 NOTE — Telephone Encounter (Signed)
Attempted to contact the patient and unable to leave a message, mailbox is full.  [facility] lab order form left at front desk.

## 2019-10-29 NOTE — Telephone Encounter (Signed)
Patient called stating she lost her [facility] paperwork.  Patient is requesting a return call to let her know if she can come in the office tomorrow morning 10/30/19 and pick up another packet.

## 2019-10-30 ENCOUNTER — Ambulatory Visit: Payer: Self-pay | Admitting: Rheumatology

## 2019-11-02 NOTE — Progress Notes (Deleted)
Office Visit Note  Patient: [name]             Date of Birth: 09/27/1985           MRN: [id number]             PCP: Jolinda Croak, MD Referring: Beverley Fiedler, * Visit Date: 11/13/2019 Occupation: _0 @  Subjective:  No chief complaint on file.   History of Present Illness: [name] is a 34 y.o. female ***   Activities of Daily Living:  Patient reports morning stiffness for *** {minute/hour:19697}.   Patient {ACTIONS;DENIES/REPORTS:21021675::"Denies"} nocturnal pain.  Difficulty dressing/grooming: {ACTIONS;DENIES/REPORTS:21021675::"Denies"} Difficulty climbing stairs: {ACTIONS;DENIES/REPORTS:21021675::"Denies"} Difficulty getting out of chair: {ACTIONS;DENIES/REPORTS:21021675::"Denies"} Difficulty using hands for taps, buttons, cutlery, and/or writing: {ACTIONS;DENIES/REPORTS:21021675::"Denies"}  No Rheumatology ROS completed.   PMFS History:  Patient Active Problem List   Diagnosis Date Noted  . Abnormal genetic test during pregnancy 09/19/2019  . 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 H/O gonorrhea   . Encounter for supervision of normal pregnancy, unspecified, unspecified trimester 07/19/2019  . Cigarette nicotine dependence without complication 62/83/1517  . MDD (major depressive disorder) 04/11/2018  . Herpes simplex 03/09/2018  . Opioid dependence in remission ([place]) 10/17/2017  . PTSD (post-traumatic stress disorder) 10/17/2017  . Bipolar I disorder, current or most recent episode depressed, with psychotic features ([place]) 09/11/2017    Past Medical History:  Diagnosis Date  . Depression   . Drug addiction in remission ([place])   . H/O gonorrhea   . HSV-2 infection   . Pregnancy complicated by subutex maintenance, antepartum ([place])   . SVD (spontaneous vaginal delivery) 04/17/2014    Family History  Problem Relation Age of Onset  . Breast cancer Mother   . Stroke Mother   . Breast cancer  Maternal Grandmother   . Depression Sister    Past Surgical History:  Procedure Laterality Date  . BREAST BIOPSY Right   . BREAST SURGERY     reduction   Social History   Social History Narrative  . Not on file    There is no immunization history on file for this patient.   Objective: Vital Signs: LMP 04/30/2019    Physical Exam   Musculoskeletal Exam: ***  CDAI Exam: CDAI Score: -- Patient Global: --; Provider Global: -- Swollen: --; Tender: -- Joint Exam 11/13/2019   No joint exam has been documented for this visit   There is currently no information documented on the homunculus. Go to the Rheumatology activity and complete the homunculus joint exam.  Investigation: No additional findings.  Imaging: No results found.  Recent Labs: Lab Results  Component Value Date   WBC 7.6 08/03/2019   HGB 11.8 08/03/2019   PLT 302 08/03/2019   NA 140 09/15/2017   K 3.8 09/15/2017   CL 104 09/15/2017   CO2 26 09/15/2017   GLUCOSE 106 (H) 09/15/2017   BUN 17 09/15/2017   CREATININE 0.88 09/15/2017   BILITOT 0.7 09/15/2017   ALKPHOS 54 09/15/2017   AST 16 09/15/2017   ALT 13 09/15/2017   PROT 8.6 (H) 09/15/2017   ALBUMIN 5.0 09/15/2017   CALCIUM 9.7 09/15/2017   GFRAA >60 09/15/2017   02/05/19: ANA+, smith-, Ro-, La-, dsDNA 1, RNP 3.4, RF 13, uric acid 5.1, ESR 33.  Speciality Comments: No specialty comments available.  Procedures:  No procedures performed Allergies: Penicillins   Assessment / Plan:     Visit Diagnoses: No diagnosis found.  Orders: No orders  of the defined types were placed in this encounter.  No orders of the defined types were placed in this encounter.   Face-to-face time spent with patient was *** minutes. Greater than 50% of time was spent in counseling and coordination of care.  Follow-Up Instructions: No follow-ups on file.   Bo Merino, MD  Note - This record has been created using Editor, commissioning.  Chart creation  errors have been sought, but may not always  have been located. Such creation errors do not reflect on  the standard of medical care.

## 2019-11-13 ENCOUNTER — Ambulatory Visit: Payer: Medicaid Other | Admitting: *Deleted

## 2019-11-13 ENCOUNTER — Other Ambulatory Visit: Payer: Self-pay

## 2019-11-13 ENCOUNTER — Encounter: Payer: Self-pay | Admitting: *Deleted

## 2019-11-13 ENCOUNTER — Other Ambulatory Visit: Payer: Self-pay | Admitting: *Deleted

## 2019-11-13 ENCOUNTER — Ambulatory Visit: Payer: Self-pay | Admitting: Rheumatology

## 2019-11-13 ENCOUNTER — Ambulatory Visit: Payer: Medicaid Other | Attending: Obstetrics and Gynecology

## 2019-11-13 DIAGNOSIS — F17210 Nicotine dependence, cigarettes, uncomplicated: Secondary | ICD-10-CM

## 2019-11-13 DIAGNOSIS — F1121 Opioid dependence, in remission: Secondary | ICD-10-CM

## 2019-11-13 DIAGNOSIS — O99320 Drug use complicating pregnancy, unspecified trimester: Secondary | ICD-10-CM | POA: Insufficient documentation

## 2019-11-13 DIAGNOSIS — Z3480 Encounter for supervision of other normal pregnancy, unspecified trimester: Secondary | ICD-10-CM

## 2019-11-13 DIAGNOSIS — O09522 Supervision of elderly multigravida, second trimester: Secondary | ICD-10-CM | POA: Diagnosis not present

## 2019-11-13 DIAGNOSIS — O285 Abnormal chromosomal and genetic finding on antenatal screening of mother: Secondary | ICD-10-CM

## 2019-11-13 DIAGNOSIS — O99322 Drug use complicating pregnancy, second trimester: Secondary | ICD-10-CM | POA: Diagnosis not present

## 2019-11-13 DIAGNOSIS — O368390 Maternal care for abnormalities of the fetal heart rate or rhythm, unspecified trimester, not applicable or unspecified: Secondary | ICD-10-CM

## 2019-11-13 DIAGNOSIS — Z87898 Personal history of other specified conditions: Secondary | ICD-10-CM

## 2019-11-13 DIAGNOSIS — B009 Herpesviral infection, unspecified: Secondary | ICD-10-CM

## 2019-11-13 DIAGNOSIS — F1120 Opioid dependence, uncomplicated: Secondary | ICD-10-CM | POA: Insufficient documentation

## 2019-11-13 DIAGNOSIS — R768 Other specified abnormal immunological findings in serum: Secondary | ICD-10-CM

## 2019-11-13 DIAGNOSIS — Z3A25 25 weeks gestation of pregnancy: Secondary | ICD-10-CM

## 2019-11-13 DIAGNOSIS — F315 Bipolar disorder, current episode depressed, severe, with psychotic features: Secondary | ICD-10-CM

## 2019-11-13 DIAGNOSIS — E559 Vitamin D deficiency, unspecified: Secondary | ICD-10-CM

## 2019-11-13 DIAGNOSIS — Z3A20 20 weeks gestation of pregnancy: Secondary | ICD-10-CM

## 2019-11-13 DIAGNOSIS — Z362 Encounter for other antenatal screening follow-up: Secondary | ICD-10-CM

## 2019-11-13 DIAGNOSIS — F4310 Post-traumatic stress disorder, unspecified: Secondary | ICD-10-CM

## 2019-11-13 DIAGNOSIS — E7800 Pure hypercholesterolemia, unspecified: Secondary | ICD-10-CM

## 2019-11-13 DIAGNOSIS — M542 Cervicalgia: Secondary | ICD-10-CM

## 2019-11-13 DIAGNOSIS — G8929 Other chronic pain: Secondary | ICD-10-CM

## 2019-11-22 ENCOUNTER — Encounter: Payer: Medicaid Other | Admitting: Medical

## 2019-11-22 ENCOUNTER — Other Ambulatory Visit: Payer: Medicaid Other

## 2019-11-29 ENCOUNTER — Other Ambulatory Visit: Payer: Medicaid Other

## 2019-11-29 ENCOUNTER — Encounter: Payer: Medicaid Other | Admitting: Obstetrics and Gynecology

## 2019-12-06 ENCOUNTER — Other Ambulatory Visit: Payer: Self-pay | Admitting: *Deleted

## 2019-12-06 ENCOUNTER — Other Ambulatory Visit: Payer: Self-pay

## 2019-12-06 ENCOUNTER — Ambulatory Visit: Payer: Medicaid Other | Admitting: *Deleted

## 2019-12-06 ENCOUNTER — Ambulatory Visit: Payer: Medicaid Other | Attending: Obstetrics and Gynecology

## 2019-12-06 VITALS — BP 119/87 | HR 111

## 2019-12-06 DIAGNOSIS — O09523 Supervision of elderly multigravida, third trimester: Secondary | ICD-10-CM

## 2019-12-06 DIAGNOSIS — O368390 Maternal care for abnormalities of the fetal heart rate or rhythm, unspecified trimester, not applicable or unspecified: Secondary | ICD-10-CM | POA: Insufficient documentation

## 2019-12-06 DIAGNOSIS — Z79891 Long term (current) use of opiate analgesic: Secondary | ICD-10-CM

## 2019-12-06 DIAGNOSIS — O99323 Drug use complicating pregnancy, third trimester: Secondary | ICD-10-CM | POA: Diagnosis not present

## 2019-12-06 DIAGNOSIS — F1120 Opioid dependence, uncomplicated: Secondary | ICD-10-CM

## 2019-12-06 DIAGNOSIS — Z3A28 28 weeks gestation of pregnancy: Secondary | ICD-10-CM

## 2019-12-06 DIAGNOSIS — O403XX0 Polyhydramnios, third trimester, not applicable or unspecified: Secondary | ICD-10-CM | POA: Diagnosis not present

## 2019-12-06 DIAGNOSIS — O99320 Drug use complicating pregnancy, unspecified trimester: Secondary | ICD-10-CM

## 2019-12-06 DIAGNOSIS — Z362 Encounter for other antenatal screening follow-up: Secondary | ICD-10-CM

## 2019-12-06 DIAGNOSIS — O285 Abnormal chromosomal and genetic finding on antenatal screening of mother: Secondary | ICD-10-CM | POA: Diagnosis not present
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
## 2019-12-11 ENCOUNTER — Ambulatory Visit: Payer: Medicaid Other

## 2019-12-11 ENCOUNTER — Encounter: Payer: Self-pay | Admitting: Advanced Practice Midwife

## 2019-12-11 ENCOUNTER — Telehealth: Payer: Self-pay

## 2019-12-11 NOTE — Telephone Encounter (Signed)
A staff message was received that the patient needs to be rescheduled for some missed appointments.  The patient was called to schedule an appointment.  A message was left for the patient to call the office to reschedule the appointment.

## 2019-12-19 ENCOUNTER — Ambulatory Visit (INDEPENDENT_AMBULATORY_CARE_PROVIDER_SITE_OTHER): Payer: Medicaid Other | Admitting: Obstetrics and Gynecology

## 2019-12-19 ENCOUNTER — Encounter: Payer: Self-pay | Admitting: Obstetrics and Gynecology

## 2019-12-19 ENCOUNTER — Other Ambulatory Visit: Payer: Self-pay

## 2019-12-19 VITALS — BP 117/71 | HR 88 | Wt 141.1 lb

## 2019-12-19 DIAGNOSIS — F1124 Opioid dependence with opioid-induced mood disorder: Secondary | ICD-10-CM

## 2019-12-19 DIAGNOSIS — Z3480 Encounter for supervision of other normal pregnancy, unspecified trimester: Secondary | ICD-10-CM

## 2019-12-19 DIAGNOSIS — F603 Borderline personality disorder: Secondary | ICD-10-CM

## 2019-12-19 DIAGNOSIS — O409XX0 Polyhydramnios, unspecified trimester, not applicable or unspecified: Secondary | ICD-10-CM

## 2019-12-19 NOTE — Patient Instructions (Signed)
Third Trimester of Pregnancy The third trimester is from week 28 through week 40 (months 7 through 9). The third trimester is a time when the unborn baby (fetus) is growing rapidly. At the end of the ninth month, the fetus is about 20 inches in length and weighs 6-10 pounds. Body changes during your third trimester Your body will continue to go through many changes during pregnancy. The changes vary from woman to woman. During the third trimester:  Your weight will continue to increase. You can expect to gain 25-35 pounds (11-16 kg) by the end of the pregnancy.  You may begin to get stretch marks on your hips, abdomen, and breasts.  You may urinate more often because the fetus is moving lower into your pelvis and pressing on your bladder.  You may develop or continue to have heartburn. This is caused by increased hormones that slow down muscles in the digestive tract.  You may develop or continue to have constipation because increased hormones slow digestion and cause the muscles that push waste through your intestines to relax.  You may develop hemorrhoids. These are swollen veins (varicose veins) in the rectum that can itch or be painful.  You may develop swollen, bulging veins (varicose veins) in your legs.  You may have increased body aches in the pelvis, back, or thighs. This is due to weight gain and increased hormones that are relaxing your joints.  You may have changes in your hair. These can include thickening of your hair, rapid growth, and changes in texture. Some women also have hair loss during or after pregnancy, or hair that feels dry or thin. Your hair will most likely return to normal after your baby is born.  Your breasts will continue to grow and they will continue to become tender. A yellow fluid (colostrum) may leak from your breasts. This is the first milk you are producing for your baby.  Your belly button may stick out.  You may notice more swelling in your hands,  face, or ankles.  You may have increased tingling or numbness in your hands, arms, and legs. The skin on your belly may also feel numb.  You may feel short of breath because of your expanding uterus.  You may have more problems sleeping. This can be caused by the size of your belly, increased need to urinate, and an increase in your body's metabolism.  You may notice the fetus "dropping," or moving lower in your abdomen (lightening).  You may have increased vaginal discharge.  You may notice your joints feel loose and you may have pain around your pelvic bone. What to expect at prenatal visits You will have prenatal exams every 2 weeks until week 36. Then you will have weekly prenatal exams. During a routine prenatal visit:  You will be weighed to make sure you and the baby are growing normally.  Your blood pressure will be taken.  Your abdomen will be measured to track your baby's growth.  The fetal heartbeat will be listened to.  Any test results from the previous visit will be discussed.  You may have a cervical check near your due date to see if your cervix has softened or thinned (effaced).  You will be tested for Group B streptococcus. This happens between 35 and 37 weeks. Your health care provider may ask you:  What your birth plan is.  How you are feeling.  If you are feeling the baby move.  If you have had any abnormal   symptoms, such as leaking fluid, bleeding, severe headaches, or abdominal cramping.  If you are using any tobacco products, including cigarettes, chewing tobacco, and electronic cigarettes.  If you have any questions. Other tests or screenings that may be performed during your third trimester include:  Blood tests that check for low iron levels (anemia).  Fetal testing to check the health, activity level, and growth of the fetus. Testing is done if you have certain medical conditions or if there are problems during the pregnancy.  Nonstress test  (NST). This test checks the health of your baby to make sure there are no signs of problems, such as the baby not getting enough oxygen. During this test, a belt is placed around your belly. The baby is made to move, and its heart rate is monitored during movement. What is false labor? False labor is a condition in which you feel small, irregular tightenings of the muscles in the womb (contractions) that usually go away with rest, changing position, or drinking water. These are called Braxton Hicks contractions. Contractions may last for hours, days, or even weeks before true labor sets in. If contractions come at regular intervals, become more frequent, increase in intensity, or become painful, you should see your health care provider. What are the signs of labor?  Abdominal cramps.  Regular contractions that start at 10 minutes apart and become stronger and more frequent with time.  Contractions that start on the top of the uterus and spread down to the lower abdomen and back.  Increased pelvic pressure and dull back pain.  A watery or bloody mucus discharge that comes from the vagina.  Leaking of amniotic fluid. This is also known as your "water breaking." It could be a slow trickle or a gush. Let your health care provider know if it has a color or strange odor. If you have any of these signs, call your health care provider right away, even if it is before your due date. Follow these instructions at home: Medicines  Follow your health care provider's instructions regarding medicine use. Specific medicines may be either safe or unsafe to take during pregnancy.  Take a prenatal vitamin that contains at least 600 micrograms (mcg) of folic acid.  If you develop constipation, try taking a stool softener if your health care provider approves. Eating and drinking   Eat a balanced diet that includes fresh fruits and vegetables, whole grains, good sources of protein such as meat, eggs, or tofu,  and low-fat dairy. Your health care provider will help you determine the amount of weight gain that is right for you.  Avoid raw meat and uncooked cheese. These carry germs that can cause birth defects in the baby.  If you have low calcium intake from food, talk to your health care provider about whether you should take a daily calcium supplement.  Eat four or five small meals rather than three large meals a day.  Limit foods that are high in fat and processed sugars, such as fried and sweet foods.  To prevent constipation: ? Drink enough fluid to keep your urine clear or pale yellow. ? Eat foods that are high in fiber, such as fresh fruits and vegetables, whole grains, and beans. Activity  Exercise only as directed by your health care provider. Most women can continue their usual exercise routine during pregnancy. Try to exercise for 30 minutes at least 5 days a week. Stop exercising if you experience uterine contractions.  Avoid heavy lifting.  Do   not exercise in extreme heat or humidity, or at high altitudes.  Wear low-heel, comfortable shoes.  Practice good posture.  You may continue to have sex unless your health care provider tells you otherwise. Relieving pain and discomfort  Take frequent breaks and rest with your legs elevated if you have leg cramps or low back pain.  Take warm sitz baths to soothe any pain or discomfort caused by hemorrhoids. Use hemorrhoid cream if your health care provider approves.  Wear a good support bra to prevent discomfort from breast tenderness.  If you develop varicose veins: ? Wear support pantyhose or compression stockings as told by your healthcare provider. ? Elevate your feet for 15 minutes, 3-4 times a day. Prenatal care  Write down your questions. Take them to your prenatal visits.  Keep all your prenatal visits as told by your health care provider. This is important. Safety  Wear your seat belt at all times when driving.  Make  a list of emergency phone numbers, including numbers for family, friends, the hospital, and police and fire departments. General instructions  Avoid cat litter boxes and soil used by cats. These carry germs that can cause birth defects in the baby. If you have a cat, ask someone to clean the litter box for you.  Do not travel far distances unless it is absolutely necessary and only with the approval of your health care provider.  Do not use hot tubs, steam rooms, or saunas.  Do not drink alcohol.  Do not use any products that contain nicotine or tobacco, such as cigarettes and e-cigarettes. If you need help quitting, ask your health care provider.  Do not use any medicinal herbs or unprescribed drugs. These chemicals affect the formation and growth of the baby.  Do not douche or use tampons or scented sanitary pads.  Do not cross your legs for long periods of time.  To prepare for the arrival of your baby: ? Take prenatal classes to understand, practice, and ask questions about labor and delivery. ? Make a trial run to the hospital. ? Visit the hospital and tour the maternity area. ? Arrange for maternity or paternity leave through employers. ? Arrange for family and friends to take care of pets while you are in the hospital. ? Purchase a rear-facing car seat and make sure you know how to install it in your car. ? Pack your hospital bag. ? Prepare the baby's nursery. Make sure to remove all pillows and stuffed animals from the baby's crib to prevent suffocation.  Visit your dentist if you have not gone during your pregnancy. Use a soft toothbrush to brush your teeth and be gentle when you floss. Contact a health care provider if:  You are unsure if you are in labor or if your water has broken.  You become dizzy.  You have mild pelvic cramps, pelvic pressure, or nagging pain in your abdominal area.  You have lower back pain.  You have persistent nausea, vomiting, or  diarrhea.  You have an unusual or bad smelling vaginal discharge.  You have pain when you urinate. Get help right away if:  Your water breaks before 37 weeks.  You have regular contractions less than 5 minutes apart before 37 weeks.  You have a fever.  You are leaking fluid from your vagina.  You have spotting or bleeding from your vagina.  You have severe abdominal pain or cramping.  You have rapid weight loss or weight gain.  You have   shortness of breath with chest pain.  You notice sudden or extreme swelling of your face, hands, ankles, feet, or legs.  Your baby makes fewer than 10 movements in 2 hours.  You have severe headaches that do not go away when you take medicine.  You have vision changes. Summary  The third trimester is from week 28 through week 40, months 7 through 9. The third trimester is a time when the unborn baby (fetus) is growing rapidly.  During the third trimester, your discomfort may increase as you and your baby continue to gain weight. You may have abdominal, leg, and back pain, sleeping problems, and an increased need to urinate.  During the third trimester your breasts will keep growing and they will continue to become tender. A yellow fluid (colostrum) may leak from your breasts. This is the first milk you are producing for your baby.  False labor is a condition in which you feel small, irregular tightenings of the muscles in the womb (contractions) that eventually go away. These are called Braxton Hicks contractions. Contractions may last for hours, days, or even weeks before true labor sets in.  Signs of labor can include: abdominal cramps; regular contractions that start at 10 minutes apart and become stronger and more frequent with time; watery or bloody mucus discharge that comes from the vagina; increased pelvic pressure and dull back pain; and leaking of amniotic fluid. This information is not intended to replace advice given to you by your  health care provider. Make sure you discuss any questions you have with your health care provider. Document Revised: 05/04/2018 Document Reviewed: 02/17/2016 Elsevier Patient Education  2020 [employer]  

## 2019-12-19 NOTE — Progress Notes (Signed)
Patient presents for ROB. Patient has no concerns.  

## 2019-12-19 NOTE — Progress Notes (Signed)
Subjective:  [name] is a 34 y.o. [phone] at [redacted]w[redacted]d being seen today for ongoing prenatal care.  She is currently monitored for the following issues for this high-risk pregnancy and has Bipolar I disorder, current or most recent episode depressed, with psychotic features (HCC); Cigarette nicotine dependence without complication; Herpes simplex; MDD (major depressive disorder); Opioid dependence in remission [facility]); PTSD (post-traumatic stress disorder); Encounter for supervision of normal pregnancy, unspecified, unspecified trimester; H/O gonorrhea; Cervical high risk HPV (human papillomavirus) test positive; Abnormal genetic test during pregnancy; Borderline personality disorder (HCC); Opioid dependence with opioid-induced mood disorder (HCC); and Polyhydramnios affecting pregnancy on their problem list.  Patient reports no complaints.  Contractions: Irritability. Vag. Bleeding: None.  Movement: Present. Denies leaking of fluid.   The following portions of the patient's history were reviewed and updated as appropriate: allergies, current medications, past family history, past medical history, past social history, past surgical history and problem list. Problem list updated.  Objective:   Vitals:   12/19/19 1408  BP: 117/71  Pulse: 88  Weight: 141 lb 1.6 oz (64 kg)    Fetal Status:     Movement: Present     General:  Alert, oriented and cooperative. Patient is in no acute distress.  Skin: Skin is warm and dry. No rash noted.   Cardiovascular: Normal heart rate noted  Respiratory: Normal respiratory effort, no problems with respiration noted  Abdomen: Soft, gravid, appropriate for gestational age. Pain/Pressure: Absent     Pelvic:  Cervical exam deferred        Extremities: Normal range of motion.  Edema: None  Mental Status: Normal mood and affect. Normal behavior. Normal judgment and thought content.   Urinalysis:      Assessment and Plan:  Pregnancy: [id number] at [redacted]w[redacted]d  1.  Supervision of other normal pregnancy, antepartum Stable Glucola test next week  2. Borderline personality disorder (HCC) Followed by Psych  3. Polyhydramnios affecting pregnancy U/S as per MFM Glucola next week  4. Opioid dependence with opioid-induced mood disorder (HCC) Stable on Subutex  Preterm labor symptoms and general obstetric precautions including but not limited to vaginal bleeding, contractions, leaking of fluid and fetal movement were reviewed in detail with the patient. Please refer to After Visit Summary for other counseling recommendations.  Return in about 2 weeks (around 01/02/2020) for OB visit, face to face, MD only.   Hermina Staggers, MD

## 2019-12-26 ENCOUNTER — Other Ambulatory Visit: Payer: Self-pay

## 2019-12-26 ENCOUNTER — Other Ambulatory Visit: Payer: Medicaid Other

## 2019-12-26 DIAGNOSIS — Z3480 Encounter for supervision of other normal pregnancy, unspecified trimester: Secondary | ICD-10-CM

## 2019-12-27 LAB — CBC
Hematocrit: 34.5 % (ref 34.0–46.6)
Hemoglobin: 11.7 g/dL (ref 11.1–15.9)
MCH: 33.1 pg — ABNORMAL HIGH (ref 26.6–33.0)
MCHC: 33.9 g/dL (ref 31.5–35.7)
MCV: 98 fL — ABNORMAL HIGH (ref 79–97)
Platelets: 389 x10E3/uL (ref 150–450)
RBC: 3.54 x10E6/uL — ABNORMAL LOW (ref 3.77–5.28)
RDW: 12.6 % (ref 11.7–15.4)
WBC: 9.4 x10E3/uL (ref 3.4–10.8)

## 2019-12-27 LAB — GLUCOSE TOLERANCE, 2 HOURS W/ 1HR
Glucose, 1 hour: 126 mg/dL (ref 65–179)
Glucose, 2 hour: 128 mg/dL (ref 65–152)
Glucose, Fasting: 84 mg/dL (ref 65–91)

## 2019-12-27 LAB — RPR: RPR Ser Ql: NONREACTIVE

## 2019-12-27 LAB — HIV ANTIBODY (ROUTINE TESTING W REFLEX): HIV Screen 4th Generation wRfx: NONREACTIVE

## 2020-01-02 ENCOUNTER — Other Ambulatory Visit: Payer: Self-pay

## 2020-01-02 ENCOUNTER — Ambulatory Visit (INDEPENDENT_AMBULATORY_CARE_PROVIDER_SITE_OTHER): Payer: Medicaid Other | Admitting: Obstetrics and Gynecology

## 2020-01-02 VITALS — BP 126/82 | HR 82 | Wt 137.2 lb

## 2020-01-02 DIAGNOSIS — Z3480 Encounter for supervision of other normal pregnancy, unspecified trimester: Secondary | ICD-10-CM

## 2020-01-02 DIAGNOSIS — F603 Borderline personality disorder: Secondary | ICD-10-CM

## 2020-01-02 DIAGNOSIS — O409XX0 Polyhydramnios, unspecified trimester, not applicable or unspecified: Secondary | ICD-10-CM

## 2020-01-02 DIAGNOSIS — F17210 Nicotine dependence, cigarettes, uncomplicated: Secondary | ICD-10-CM

## 2020-01-02 DIAGNOSIS — B009 Herpesviral infection, unspecified: Secondary | ICD-10-CM

## 2020-01-02 DIAGNOSIS — F1124 Opioid dependence with opioid-induced mood disorder: Secondary | ICD-10-CM

## 2020-01-02 NOTE — Progress Notes (Signed)
ROB   CC: Hemorrhoids.just started using Prep. H and Tucks.

## 2020-01-02 NOTE — Patient Instructions (Signed)
Polyhydramnios When a woman becomes pregnant, a sac forms around her growing baby. This sac, called the amniotic sac, is filled with fluid (amniotic fluid) that:  Cushions and protects the baby.  Helps the baby's lungs and gastrointestinal tract grow.  Allows the baby to move around, which helps the baby's muscles and bones develop. Polyhydramnios means that there is too much fluid in the sac. Babies born with polyhydramnios should be checked for birth defects. What are the causes? This condition may be caused by:  Diabetes in the mother.  The baby being larger than normal for the baby's age (large for gestational age).  Problems that prevent the fetus from swallowing amniotic fluid. These may include: ? An abnormal chromosome. ? Abnormality in the baby's intestinal tract. ? A birth defect in which the baby does not have a brain or parts of the brain (anencephaly).  A condition that affects twins, called twin-twin transfusion syndrome.  An illness in the mother, such as kidney or heart disease.  A tumor of the placenta (chorioangioma).  An infection in the baby. What are the signs or symptoms? Symptoms of this condition include:  A uterus that is larger than it should be for the stage of pregnancy.  Shortness of breath.  Increased feeling of pressure on the abdomen.  Increased swelling in the legs.  Preterm labor.  Preeclampsia. How is this diagnosed? This condition may be diagnosed based on:  A measurement of your abdomen. Your health care provider can diagnose the condition if he or she measures you and notices that your uterus is larger than it should be.  An ultrasound. This image may be taken by placing a device on your abdomen or into your vagina. The test can measure the amount of fluid in the amniotic sac (amniotic fluid index). It can also: ? Show if you are carrying twins or more. ? Measure the growth of the baby. ? Look for birth defects. How is this  treated? This condition is treated by removing fluid from the amniotic sac. This is done through a procedure where a needle is inserted through the skin into the uterus (amniocentesis). Follow these instructions at home:  Make sure to keep any medical conditions you have under control. If you have diabetes, talk to your health care provider about ways to manage your blood sugar.  Keep all your prenatal visits as told by your health care provider. It is important to follow your health care provider's recommendations. Contact a health care provider if:  You think your uterus has grown too fast in a short period of time.  You feel a great amount of pressure in your pelvis and are more uncomfortable than expected. Get help right away if:  You have a gush of fluid or are leaking fluid from your vagina.  You stop feeling the baby move.  You do not feel the baby kicking as much as usual.  You have diabetes and you have a hard time keeping it under control.  You have kidney or heart disease and it is causing you problems. Summary  Polyhydramnios means there is too much fluid in the amniotic sac. This can lead to birth defects.  This condition may be diagnosed based on a measurement of your abdomen or an ultrasound.  Keep all your prenatal visits as told by your health care provider. It is important to follow your health care provider's recommendations. This information is not intended to replace advice given to you by your health   care provider. Make sure you discuss any questions you have with your health care provider. Document Revised: 12/24/2016 Document Reviewed: 03/30/2016 Elsevier Patient Education  2020 [employer]vier Inc.  

## 2020-01-02 NOTE — Progress Notes (Signed)
   PRENATAL VISIT NOTE  Subjective:  [name] is a 34 y.o. [id number] at [redacted]w[redacted]d being seen today for ongoing prenatal care.  She is currently monitored for the following issues for this high-risk pregnancy and has Bipolar I disorder, current or most recent episode depressed, with psychotic features (HCC); Cigarette nicotine dependence without complication; Herpes simplex; MDD (major depressive disorder); Opioid dependence in remission [facility]); PTSD (post-traumatic stress disorder); Encounter for supervision of normal pregnancy, unspecified, unspecified trimester; H/O gonorrhea; Cervical high risk HPV (human papillomavirus) test positive; Abnormal genetic test during pregnancy; Borderline personality disorder (HCC); Opioid dependence with opioid-induced mood disorder (HCC); and Polyhydramnios affecting pregnancy on their problem list.  Patient doing well with no acute concerns today. She reports no complaints.  Contractions: Irritability. Vag. Bleeding: None.  Movement: Present. Denies leaking of fluid.   The following portions of the patient's history were reviewed and updated as appropriate: allergies, current medications, past family history, past medical history, past social history, past surgical history and problem list. Problem list updated.  Objective:   Vitals:   01/02/20 1110  BP: 126/82  Pulse: 82  Weight: 137 lb 3.2 oz (62.2 kg)    Fetal Status: Fetal Heart Rate (bpm): 134    Movement: Present     General:  Alert, oriented and cooperative. Patient is in no acute distress.  Skin: Skin is warm and dry. No rash noted.   Cardiovascular: Normal heart rate noted  Respiratory: Normal respiratory effort, no problems with respiration noted  Abdomen: Soft, gravid, appropriate for gestational age.  Pain/Pressure: Absent     Pelvic: Cervical exam deferred        Extremities: Normal range of motion.  Edema: None  Mental Status:  Normal mood and affect. Normal behavior. Normal judgment  and thought content.   Assessment and Plan:  Pregnancy: [id number] at [redacted]w[redacted]d  1. Opioid dependence with opioid-induced mood disorder (HCC) Pt continues with subutex, but per pt she is trying to taper down with the help of her addiction apecialist  2. Cigarette nicotine dependence without complication   3. Herpes simplex Start prophylaxis at 36 weeks  4. Polyhydramnios affecting pregnancy Pt has U/S for growth and BPP on 12/16.  Discussed complications secondary to polyhydramnios  5. Borderline personality disorder (HCC)   6. Supervision of other normal pregnancy, antepartum   Preterm labor symptoms and general obstetric precautions including but not limited to vaginal bleeding, contractions, leaking of fluid and fetal movement were reviewed in detail with the patient.  Please refer to After Visit Summary for other counseling recommendations.   Return in about 2 weeks (around 01/16/2020) for [facility], in person.   Mariel Aloe, MD

## 2020-01-03 ENCOUNTER — Ambulatory Visit: Payer: Medicaid Other

## 2020-01-04 ENCOUNTER — Encounter: Payer: Self-pay | Admitting: *Deleted

## 2020-01-04 ENCOUNTER — Ambulatory Visit: Payer: Medicaid Other | Admitting: *Deleted

## 2020-01-04 ENCOUNTER — Ambulatory Visit: Payer: Medicaid Other | Attending: Obstetrics

## 2020-01-04 ENCOUNTER — Other Ambulatory Visit: Payer: Self-pay

## 2020-01-04 ENCOUNTER — Other Ambulatory Visit: Payer: Self-pay | Admitting: *Deleted

## 2020-01-04 VITALS — BP 121/70 | HR 69

## 2020-01-04 DIAGNOSIS — Z3A32 32 weeks gestation of pregnancy: Secondary | ICD-10-CM

## 2020-01-04 DIAGNOSIS — O285 Abnormal chromosomal and genetic finding on antenatal screening of mother: Secondary | ICD-10-CM | POA: Diagnosis not present

## 2020-01-04 DIAGNOSIS — O09523 Supervision of elderly multigravida, third trimester: Secondary | ICD-10-CM

## 2020-01-04 DIAGNOSIS — O99320 Drug use complicating pregnancy, unspecified trimester: Secondary | ICD-10-CM | POA: Diagnosis present

## 2020-01-04 DIAGNOSIS — F1120 Opioid dependence, uncomplicated: Secondary | ICD-10-CM | POA: Diagnosis present

## 2020-01-04 DIAGNOSIS — Z362 Encounter for other antenatal screening follow-up: Secondary | ICD-10-CM

## 2020-01-04 DIAGNOSIS — Z79891 Long term (current) use of opiate analgesic: Secondary | ICD-10-CM

## 2020-01-04 DIAGNOSIS — O403XX0 Polyhydramnios, third trimester, not applicable or unspecified: Secondary | ICD-10-CM | POA: Insufficient documentation

## 2020-01-10 ENCOUNTER — Ambulatory Visit: Payer: Medicaid Other | Attending: Obstetrics and Gynecology

## 2020-01-10 ENCOUNTER — Other Ambulatory Visit: Payer: Self-pay

## 2020-01-10 ENCOUNTER — Ambulatory Visit: Payer: Medicaid Other | Admitting: *Deleted

## 2020-01-10 ENCOUNTER — Encounter: Payer: Self-pay | Admitting: *Deleted

## 2020-01-10 ENCOUNTER — Other Ambulatory Visit: Payer: Self-pay | Admitting: Obstetrics

## 2020-01-10 VITALS — BP 118/75 | HR 96

## 2020-01-10 DIAGNOSIS — O285 Abnormal chromosomal and genetic finding on antenatal screening of mother: Secondary | ICD-10-CM

## 2020-01-10 DIAGNOSIS — O403XX0 Polyhydramnios, third trimester, not applicable or unspecified: Secondary | ICD-10-CM | POA: Diagnosis not present

## 2020-01-10 DIAGNOSIS — Z3A33 33 weeks gestation of pregnancy: Secondary | ICD-10-CM

## 2020-01-10 DIAGNOSIS — O99320 Drug use complicating pregnancy, unspecified trimester: Secondary | ICD-10-CM | POA: Insufficient documentation

## 2020-01-10 DIAGNOSIS — O09523 Supervision of elderly multigravida, third trimester: Secondary | ICD-10-CM

## 2020-01-10 DIAGNOSIS — F1910 Other psychoactive substance abuse, uncomplicated: Secondary | ICD-10-CM | POA: Diagnosis present

## 2020-01-10 DIAGNOSIS — O34219 Maternal care for unspecified type scar from previous cesarean delivery: Secondary | ICD-10-CM

## 2020-01-10 DIAGNOSIS — F1120 Opioid dependence, uncomplicated: Secondary | ICD-10-CM

## 2020-01-10 DIAGNOSIS — O99323 Drug use complicating pregnancy, third trimester: Secondary | ICD-10-CM

## 2020-01-10 DIAGNOSIS — Z362 Encounter for other antenatal screening follow-up: Secondary | ICD-10-CM

## 2020-01-10 NOTE — Procedures (Signed)
[name] 06/28/1985 [redacted]w[redacted]d  Fetus A Non-Stress Test Interpretation for 01/10/20  Indication: Unsatisfactory BPP  Fetal Heart Rate A Mode: External Baseline Rate (A): 140 bpm Variability: Moderate Accelerations: 15 x 15,10 x 10 Decelerations: None Multiple birth?: No  Uterine Activity Mode: Palpation,Toco Contraction Frequency (min): U/I Contraction Duration (sec): 20-30 Contraction Quality: Mild Resting Tone Palpated: Relaxed Resting Time: Adequate  Interpretation (Fetal Testing) Nonstress Test Interpretation: Reactive Overall Impression: Reassuring for gestational age Comments: Reviewed tracing with Dr. Judeth Cornfield

## 2020-01-16 ENCOUNTER — Other Ambulatory Visit: Payer: Self-pay

## 2020-01-16 ENCOUNTER — Telehealth: Payer: Self-pay

## 2020-01-16 ENCOUNTER — Ambulatory Visit: Payer: Medicaid Other

## 2020-01-16 ENCOUNTER — Ambulatory Visit (INDEPENDENT_AMBULATORY_CARE_PROVIDER_SITE_OTHER): Payer: Medicaid Other | Admitting: Obstetrics and Gynecology

## 2020-01-16 ENCOUNTER — Other Ambulatory Visit: Payer: Medicaid Other

## 2020-01-16 VITALS — BP 108/71 | HR 72 | Wt 141.1 lb

## 2020-01-16 DIAGNOSIS — Z3480 Encounter for supervision of other normal pregnancy, unspecified trimester: Secondary | ICD-10-CM

## 2020-01-16 DIAGNOSIS — Z3A34 34 weeks gestation of pregnancy: Secondary | ICD-10-CM

## 2020-01-16 DIAGNOSIS — O409XX0 Polyhydramnios, unspecified trimester, not applicable or unspecified: Secondary | ICD-10-CM

## 2020-01-16 DIAGNOSIS — F4310 Post-traumatic stress disorder, unspecified: Secondary | ICD-10-CM

## 2020-01-16 DIAGNOSIS — F315 Bipolar disorder, current episode depressed, severe, with psychotic features: Secondary | ICD-10-CM

## 2020-01-16 DIAGNOSIS — O285 Abnormal chromosomal and genetic finding on antenatal screening of mother: Secondary | ICD-10-CM

## 2020-01-16 DIAGNOSIS — F603 Borderline personality disorder: Secondary | ICD-10-CM

## 2020-01-16 DIAGNOSIS — F17210 Nicotine dependence, cigarettes, uncomplicated: Secondary | ICD-10-CM

## 2020-01-16 DIAGNOSIS — B009 Herpesviral infection, unspecified: Secondary | ICD-10-CM

## 2020-01-16 MED ORDER — VALACYCLOVIR HCL 500 MG PO TABS: 500.0000 mg | ORAL_TABLET | Freq: Two times a day (BID) | ORAL | 3 refills | Status: DC

## 2020-01-16 NOTE — Patient Instructions (Addendum)
Third Trimester of Pregnancy  The third trimester is from week 28 through week 40 (months 7 through 9). This trimester is when your unborn baby (fetus) is growing very fast. At the end of the ninth month, the unborn baby is about 20 inches in length. It weighs about 6-10 pounds. Follow these instructions at home: Medicines  Take over-the-counter and prescription medicines only as told by your doctor. Some medicines are safe and some medicines are not safe during pregnancy.  Take a prenatal vitamin that contains at least 600 micrograms (mcg) of folic acid.  If you have trouble pooping (constipation), take medicine that will make your stool soft (stool softener) if your doctor approves. Eating and drinking   Eat regular, healthy meals.  Avoid raw meat and uncooked cheese.  If you get low calcium from the food you eat, talk to your doctor about taking a daily calcium supplement.  Eat four or five small meals rather than three large meals a day.  Avoid foods that are high in fat and sugars, such as fried and sweet foods.  To prevent constipation: ? Eat foods that are high in fiber, like fresh fruits and vegetables, whole grains, and beans. ? Drink enough fluids to keep your pee (urine) clear or pale yellow. Activity  Exercise only as told by your doctor. Stop exercising if you start to have cramps.  Avoid heavy lifting, wear low heels, and sit up straight.  Do not exercise if it is too hot, too humid, or if you are in a place of great height (high altitude).  You may continue to have sex unless your doctor tells you not to. Relieving pain and discomfort  Wear a good support bra if your breasts are tender.  Take frequent breaks and rest with your legs raised if you have leg cramps or low back pain.  Take warm water baths (sitz baths) to soothe pain or discomfort caused by hemorrhoids. Use hemorrhoid cream if your doctor approves.  If you develop puffy, bulging veins (varicose  veins) in your legs: ? Wear support hose or compression stockings as told by your doctor. ? Raise (elevate) your feet for 15 minutes, 3-4 times a day. ? Limit salt in your food. Safety  Wear your seat belt when driving.  Make a list of emergency phone numbers, including numbers for family, friends, the hospital, and police and fire departments. Preparing for your baby's arrival To prepare for the arrival of your baby:  Take prenatal classes.  Practice driving to the hospital.  Visit the hospital and tour the maternity area.  Talk to your work about taking leave once the baby comes.  Pack your hospital bag.  Prepare the baby's room.  Go to your doctor visits.  Buy a rear-facing car seat. Learn how to install it in your car. General instructions  Do not use hot tubs, steam rooms, or saunas.  Do not use any products that contain nicotine or tobacco, such as cigarettes and e-cigarettes. If you need help quitting, ask your doctor.  Do not drink alcohol.  Do not douche or use tampons or scented sanitary pads.  Do not cross your legs for long periods of time.  Do not travel for long distances unless you must. Only do so if your doctor says it is okay.  Visit your dentist if you have not gone during your pregnancy. Use a soft toothbrush to brush your teeth. Be gentle when you floss.  Avoid cat litter boxes and soil   used by cats. These carry germs that can cause birth defects in the baby and can cause a loss of your baby (miscarriage) or stillbirth.  Keep all your prenatal visits as told by your doctor. This is important. Contact a doctor if:  You are not sure if you are in labor or if your water has broken.  You are dizzy.  You have mild cramps or pressure in your lower belly.  You have a nagging pain in your belly area.  You continue to feel sick to your stomach, you throw up, or you have watery poop.  You have bad smelling fluid coming from your vagina.  You have  pain when you pee. Get help right away if:  You have a fever.  You are leaking fluid from your vagina.  You are spotting or bleeding from your vagina.  You have severe belly cramps or pain.  You lose or gain weight quickly.  You have trouble catching your breath and have chest pain.  You notice sudden or extreme puffiness (swelling) of your face, hands, ankles, feet, or legs.  You have not felt the baby move in over an hour.  You have severe headaches that do not go away with medicine.  You have trouble seeing.  You are leaking, or you are having a gush of fluid, from your vagina before you are 37 weeks.  You have regular belly spasms (contractions) before you are 37 weeks. Summary  The third trimester is from week 28 through week 40 (months 7 through 9). This time is when your unborn baby is growing very fast.  Follow your doctor's advice about medicine, food, and activity.  Get ready for the arrival of your baby by taking prenatal classes, getting all the baby items ready, preparing the baby's room, and visiting your doctor to be checked.  Get help right away if you are bleeding from your vagina, or you have chest pain and trouble catching your breath, or if you have not felt your baby move in over an hour. This information is not intended to replace advice given to you by your health care provider. Make sure you discuss any questions you have with your health care provider. Document Revised: 05/04/2018 Document Reviewed: 02/17/2016 Elsevier Patient Education  2020 [employer].  Polyhydramnios When a woman becomes pregnant, a sac forms around her growing baby. This sac, called the amniotic sac, is filled with fluid (amniotic fluid) that:  Cushions and protects the baby.  Helps the baby's lungs and gastrointestinal tract grow.  Allows the baby to move around, which helps the baby's muscles and bones develop. Polyhydramnios means that there is too much fluid in the  sac. Babies born with polyhydramnios should be checked for birth defects. What are the causes? This condition may be caused by:  Diabetes in the mother.  The baby being larger than normal for the baby's age (large for gestational age).  Problems that prevent the fetus from swallowing amniotic fluid. These may include: ? An abnormal chromosome. ? Abnormality in the baby's intestinal tract. ? A birth defect in which the baby does not have a brain or parts of the brain (anencephaly).  A condition that affects twins, called twin-twin transfusion syndrome.  An illness in the mother, such as kidney or heart disease.  A tumor of the placenta (chorioangioma).  An infection in the baby. What are the signs or symptoms? Symptoms of this condition include:  A uterus that is larger than it should  be for the stage of pregnancy.  Shortness of breath.  Increased feeling of pressure on the abdomen.  Increased swelling in the legs.  Preterm labor.  Preeclampsia. How is this diagnosed? This condition may be diagnosed based on:  A measurement of your abdomen. Your health care provider can diagnose the condition if he or she measures you and notices that your uterus is larger than it should be.  An ultrasound. This image may be taken by placing a device on your abdomen or into your vagina. The test can measure the amount of fluid in the amniotic sac (amniotic fluid index). It can also: ? Show if you are carrying twins or more. ? Measure the growth of the baby. ? Look for birth defects. How is this treated? This condition is treated by removing fluid from the amniotic sac. This is done through a procedure where a needle is inserted through the skin into the uterus (amniocentesis). Follow these instructions at home:  Make sure to keep any medical conditions you have under control. If you have diabetes, talk to your health care provider about ways to manage your blood sugar.  Keep all your  prenatal visits as told by your health care provider. It is important to follow your health care provider's recommendations. Contact a health care provider if:  You think your uterus has grown too fast in a short period of time.  You feel a great amount of pressure in your pelvis and are more uncomfortable than expected. Get help right away if:  You have a gush of fluid or are leaking fluid from your vagina.  You stop feeling the baby move.  You do not feel the baby kicking as much as usual.  You have diabetes and you have a hard time keeping it under control.  You have kidney or heart disease and it is causing you problems. Summary  Polyhydramnios means there is too much fluid in the amniotic sac. This can lead to birth defects.  This condition may be diagnosed based on a measurement of your abdomen or an ultrasound.  Keep all your prenatal visits as told by your health care provider. It is important to follow your health care provider's recommendations. This information is not intended to replace advice given to you by your health care provider. Make sure you discuss any questions you have with your health care provider. Document Revised: 12/24/2016 Document Reviewed: 03/30/2016 Elsevier Patient Education  2020 [employer].

## 2020-01-16 NOTE — Progress Notes (Signed)
   PRENATAL VISIT NOTE  Subjective:  [name] is a 34 y.o. F6C1275 at [redacted]w[redacted]d being seen today for ongoing prenatal care.  She is currently monitored for the following issues for this high-risk pregnancy and has Bipolar I disorder, current or most recent episode depressed, with psychotic features (HCC); Cigarette nicotine dependence without complication; Herpes simplex; MDD (major depressive disorder); Opioid dependence in remission [facility]); PTSD (post-traumatic stress disorder); Encounter for supervision of normal pregnancy, unspecified, unspecified trimester; H/O gonorrhea; Cervical high risk HPV (human papillomavirus) test positive; Abnormal genetic test during pregnancy; Borderline personality disorder (HCC); Opioid dependence with opioid-induced mood disorder (HCC); Polyhydramnios affecting pregnancy; and [redacted] weeks gestation of pregnancy on their problem list.  Patient doing well with no acute concerns today. She reports no complaints.  Contractions: Not present. Vag. Bleeding: None.  Movement: Present. Denies leaking of fluid.   The following portions of the patient's history were reviewed and updated as appropriate: allergies, current medications, past family history, past medical history, past social history, past surgical history and problem list. Problem list updated.  Objective:   Vitals:   01/16/20 0829  BP: 108/71  Pulse: 72  Weight: 141 lb 1.6 oz (64 kg)    Fetal Status: Fetal Heart Rate (bpm): 146   Movement: Present     General:  Alert, oriented and cooperative. Patient is in no acute distress.  Skin: Skin is warm and dry. No rash noted.   Cardiovascular: Normal heart rate noted  Respiratory: Normal respiratory effort, no problems with respiration noted  Abdomen: Soft, gravid, appropriate for gestational age.  Pain/Pressure: Absent     Pelvic: Cervical exam deferred        Extremities: Normal range of motion.     Mental Status:  Normal mood and affect. Normal  behavior. Normal judgment and thought content.   Assessment and Plan:  Pregnancy: [id number] at [redacted]w[redacted]d  1. Bipolar I disorder, current or most recent episode depressed, with psychotic features (HCC) Currently stable  2. Polyhydramnios affecting pregnancy Weekly BPP, has appt today, last testing 8/10 points off for breathing  3. Borderline personality disorder (HCC)   4. Abnormal genetic test during pregnancy   5. Supervision of other normal pregnancy, antepartum  - valACYclovir (VALTREX) 500 MG tablet; Take 1 tablet (500 mg total) by mouth 2 (two) times daily. Do not take until 36 weeks  Dispense: 60 tablet; Refill: 3  6. PTSD (post-traumatic stress disorder)   7. Herpes simplex  - valACYclovir (VALTREX) 500 MG tablet; Take 1 tablet (500 mg total) by mouth 2 (two) times daily. Do not take until 36 weeks  Dispense: 60 tablet; Refill: 3  8. Cigarette nicotine dependence without complication   9. [redacted] weeks gestation of pregnancy   Preterm labor symptoms and general obstetric precautions including but not limited to vaginal bleeding, contractions, leaking of fluid and fetal movement were reviewed in detail with the patient.  Please refer to After Visit Summary for other counseling recommendations.   Return in about 2 weeks (around 01/30/2020) for [facility], in person, 36 weeks swabs.   Mariel Aloe, MD

## 2020-01-16 NOTE — Telephone Encounter (Signed)
Patient called in to cancel her NST appointment for today - she stated that she did not have childcare and will not be able to come in until next week.

## 2020-01-16 NOTE — Progress Notes (Signed)
Pt presents for ROB, [redacted]w[redacted]d. C/o hemorrhoids

## 2020-01-24 ENCOUNTER — Other Ambulatory Visit: Payer: Self-pay

## 2020-01-24 ENCOUNTER — Encounter: Payer: Self-pay | Admitting: *Deleted

## 2020-01-24 ENCOUNTER — Ambulatory Visit: Payer: Medicaid Other | Admitting: *Deleted

## 2020-01-24 ENCOUNTER — Ambulatory Visit: Payer: Medicaid Other | Attending: Obstetrics and Gynecology

## 2020-01-24 VITALS — BP 131/73 | HR 69

## 2020-01-24 DIAGNOSIS — O99323 Drug use complicating pregnancy, third trimester: Secondary | ICD-10-CM

## 2020-01-24 DIAGNOSIS — O285 Abnormal chromosomal and genetic finding on antenatal screening of mother: Secondary | ICD-10-CM

## 2020-01-24 DIAGNOSIS — Z79891 Long term (current) use of opiate analgesic: Secondary | ICD-10-CM

## 2020-01-24 DIAGNOSIS — Z3A35 35 weeks gestation of pregnancy: Secondary | ICD-10-CM

## 2020-01-24 DIAGNOSIS — O403XX0 Polyhydramnios, third trimester, not applicable or unspecified: Secondary | ICD-10-CM | POA: Diagnosis not present

## 2020-01-24 DIAGNOSIS — O09523 Supervision of elderly multigravida, third trimester: Secondary | ICD-10-CM

## 2020-01-24 DIAGNOSIS — Z362 Encounter for other antenatal screening follow-up: Secondary | ICD-10-CM

## 2020-01-24 DIAGNOSIS — F1120 Opioid dependence, uncomplicated: Secondary | ICD-10-CM

## 2020-01-26 NOTE — L&D Delivery Note (Signed)
Delivery Note Patient presented to L&D after delivering baby at home and delivered placenta en route to hospital. Patient was brought straigt to L&D by EMS. At 10:53 PM a viable female was delivered via Vaginal, Spontaneous (Presentation: vertex, unknown).  APGARs unknown, weight pending.   Placenta status: Spontaneous;Pathology, Intact.  Cord: 3 vessels with the following complications: None. Placenta will be sent to pathology.  Anesthesia: None Episiotomy: None Lacerations: bilateral labial lacerations, hemostatic Est. Blood Loss (mL): unknown, no bleeding noted on fundal massage or evaluation  Mom to postpartum.  Baby to Couplet care / Skin to Skin.  Alric Seton 02/03/2020, 12:11 AM

## 2020-01-28 ENCOUNTER — Other Ambulatory Visit: Payer: Self-pay | Admitting: *Deleted

## 2020-01-28 DIAGNOSIS — F1120 Opioid dependence, uncomplicated: Secondary | ICD-10-CM

## 2020-01-28 DIAGNOSIS — O99320 Drug use complicating pregnancy, unspecified trimester: Secondary | ICD-10-CM

## 2020-01-30 ENCOUNTER — Ambulatory Visit (INDEPENDENT_AMBULATORY_CARE_PROVIDER_SITE_OTHER): Payer: Medicaid Other | Admitting: Obstetrics and Gynecology

## 2020-01-30 ENCOUNTER — Encounter: Payer: Self-pay | Admitting: Obstetrics and Gynecology

## 2020-01-30 ENCOUNTER — Other Ambulatory Visit (HOSPITAL_COMMUNITY)
Admission: RE | Admit: 2020-01-30 | Discharge: 2020-01-30 | Disposition: A | Discharge status: Home / Self Care | Payer: Medicaid Other | Source: Ambulatory Visit | Attending: Obstetrics and Gynecology | Admitting: Obstetrics and Gynecology

## 2020-01-30 ENCOUNTER — Other Ambulatory Visit: Payer: Self-pay

## 2020-01-30 VITALS — BP 109/75 | HR 89 | Wt 137.0 lb

## 2020-01-30 DIAGNOSIS — F1124 Opioid dependence with opioid-induced mood disorder: Secondary | ICD-10-CM | POA: Diagnosis not present

## 2020-01-30 DIAGNOSIS — Z3490 Encounter for supervision of normal pregnancy, unspecified, unspecified trimester: Secondary | ICD-10-CM | POA: Diagnosis not present

## 2020-01-30 DIAGNOSIS — B009 Herpesviral infection, unspecified: Secondary | ICD-10-CM

## 2020-01-30 DIAGNOSIS — Z8619 Personal history of other infectious and parasitic diseases: Secondary | ICD-10-CM

## 2020-01-30 DIAGNOSIS — O99323 Drug use complicating pregnancy, third trimester: Secondary | ICD-10-CM

## 2020-01-30 DIAGNOSIS — O409XX0 Polyhydramnios, unspecified trimester, not applicable or unspecified: Secondary | ICD-10-CM | POA: Diagnosis not present

## 2020-01-30 DIAGNOSIS — Z3A36 36 weeks gestation of pregnancy: Secondary | ICD-10-CM

## 2020-01-30 NOTE — Progress Notes (Signed)
ROB/GBS.  C/o hemorrhoids.

## 2020-01-30 NOTE — Progress Notes (Signed)
   PRENATAL VISIT NOTE  Subjective:  [name] is a 35 y.o. H2C9470 at [redacted]w[redacted]d being seen today for ongoing prenatal care.  She is currently monitored for the following issues for this high-risk pregnancy and has Bipolar I disorder, current or most recent episode depressed, with psychotic features (HCC); Cigarette nicotine dependence without complication; Herpes simplex; MDD (major depressive disorder); Opioid dependence in remission [facility]); PTSD (post-traumatic stress disorder); Encounter for supervision of normal pregnancy, unspecified, unspecified trimester; H/O gonorrhea; Cervical high risk HPV (human papillomavirus) test positive; Abnormal genetic test during pregnancy; Borderline personality disorder (HCC); Opioid dependence with opioid-induced mood disorder (HCC); Polyhydramnios affecting pregnancy; and [redacted] weeks gestation of pregnancy on their problem list.  Patient reports hemorrhoids.  Contractions: Irritability. Vag. Bleeding: None.  Movement: Present. Denies leaking of fluid.   The following portions of the patient's history were reviewed and updated as appropriate: allergies, current medications, past family history, past medical history, past social history, past surgical history and problem list.   Objective:   Vitals:   01/30/20 1459  BP: 109/75  Pulse: 89  Weight: 137 lb (62.1 kg)    Fetal Status: Fetal Heart Rate (bpm): 145   Movement: Present     General:  Alert, oriented and cooperative. Patient is in no acute distress.  Skin: Skin is warm and dry. No rash noted.   Cardiovascular: Normal heart rate noted  Respiratory: Normal respiratory effort, no problems with respiration noted  Abdomen: Soft, gravid, appropriate for gestational age.  Pain/Pressure: Present     Pelvic: Cervical exam deferred        Extremities: Normal range of motion.  Edema: Trace  Mental Status: Normal mood and affect. Normal behavior. Normal judgment and thought content.   Assessment and  Plan:  Pregnancy: [id number] at [redacted]w[redacted]d  1. Encounter for supervision of normal pregnancy, antepartum, unspecified gravidity - Cervicovaginal ancillary only( [place]) - Strep Gp B NAA  2. Opioid dependence with opioid-induced mood disorder (HCC) Cont subutex  3. H/O gonorrhea  4. Polyhydramnios affecting pregnancy Resolved on 35 weeks [place]  5. Herpes simplex Start valtrex ppx  6. [redacted] weeks gestation of pregnancy   Preterm labor symptoms and general obstetric precautions including but not limited to vaginal bleeding, contractions, leaking of fluid and fetal movement were reviewed in detail with the patient. Please refer to After Visit Summary for other counseling recommendations.   Return in about 1 week (around 02/06/2020) for high OB, in person.  Future Appointments  Date Time Provider Department Center  02/01/2020  1:15 PM [facility] NURSE [facility]  02/01/2020  1:30 PM [facility] US3 [facility]  02/04/2020  2:45 PM [facility] NURSE [facility]  02/04/2020  3:00 PM [facility] US1 [facility]  02/12/2020  8:30 AM [facility] NURSE [facility]  02/12/2020  8:45 AM [facility] US5 [facility]    Conan Bowens, MD

## 2020-01-31 LAB — CERVICOVAGINAL ANCILLARY ONLY
Chlamydia: NEGATIVE
Comment: NEGATIVE
Comment: NORMAL
Neisseria Gonorrhea: NEGATIVE

## 2020-02-01 ENCOUNTER — Ambulatory Visit (HOSPITAL_BASED_OUTPATIENT_CLINIC_OR_DEPARTMENT_OTHER): Payer: Medicaid Other

## 2020-02-01 ENCOUNTER — Ambulatory Visit: Payer: Medicaid Other | Admitting: *Deleted

## 2020-02-01 ENCOUNTER — Other Ambulatory Visit: Payer: Self-pay | Admitting: *Deleted

## 2020-02-01 ENCOUNTER — Other Ambulatory Visit: Payer: Self-pay

## 2020-02-01 ENCOUNTER — Encounter: Payer: Self-pay | Admitting: *Deleted

## 2020-02-01 VITALS — BP 104/69 | HR 81

## 2020-02-01 DIAGNOSIS — O403XX0 Polyhydramnios, third trimester, not applicable or unspecified: Secondary | ICD-10-CM

## 2020-02-01 DIAGNOSIS — O99320 Drug use complicating pregnancy, unspecified trimester: Secondary | ICD-10-CM

## 2020-02-01 DIAGNOSIS — O09523 Supervision of elderly multigravida, third trimester: Secondary | ICD-10-CM

## 2020-02-01 DIAGNOSIS — F1910 Other psychoactive substance abuse, uncomplicated: Secondary | ICD-10-CM | POA: Insufficient documentation

## 2020-02-01 DIAGNOSIS — O285 Abnormal chromosomal and genetic finding on antenatal screening of mother: Secondary | ICD-10-CM

## 2020-02-01 DIAGNOSIS — F1120 Opioid dependence, uncomplicated: Secondary | ICD-10-CM

## 2020-02-01 DIAGNOSIS — Z3A36 36 weeks gestation of pregnancy: Secondary | ICD-10-CM

## 2020-02-01 DIAGNOSIS — O280 Abnormal hematological finding on antenatal screening of mother: Secondary | ICD-10-CM

## 2020-02-01 DIAGNOSIS — Z362 Encounter for other antenatal screening follow-up: Secondary | ICD-10-CM

## 2020-02-01 DIAGNOSIS — Z79891 Long term (current) use of opiate analgesic: Secondary | ICD-10-CM

## 2020-02-01 LAB — STREP GP B NAA: Strep Gp B NAA: NEGATIVE

## 2020-02-02 ENCOUNTER — Inpatient Hospital Stay (HOSPITAL_COMMUNITY)
Admission: AD | Admit: 2020-02-02 | Discharge: 2020-02-04 | DRG: 776 | Disposition: A | Discharge status: Home / Self Care | Payer: Medicaid Other | Attending: Obstetrics and Gynecology | Admitting: Obstetrics and Gynecology

## 2020-02-02 DIAGNOSIS — F17210 Nicotine dependence, cigarettes, uncomplicated: Secondary | ICD-10-CM | POA: Diagnosis present

## 2020-02-02 DIAGNOSIS — B009 Herpesviral infection, unspecified: Secondary | ICD-10-CM | POA: Diagnosis not present

## 2020-02-02 DIAGNOSIS — F329 Major depressive disorder, single episode, unspecified: Secondary | ICD-10-CM | POA: Diagnosis present

## 2020-02-02 DIAGNOSIS — F603 Borderline personality disorder: Secondary | ICD-10-CM | POA: Diagnosis not present

## 2020-02-02 DIAGNOSIS — F32A Depression, unspecified: Secondary | ICD-10-CM | POA: Diagnosis present

## 2020-02-02 DIAGNOSIS — O99335 Smoking (tobacco) complicating the puerperium: Secondary | ICD-10-CM | POA: Diagnosis present

## 2020-02-02 DIAGNOSIS — F4310 Post-traumatic stress disorder, unspecified: Secondary | ICD-10-CM | POA: Diagnosis present

## 2020-02-02 DIAGNOSIS — F315 Bipolar disorder, current episode depressed, severe, with psychotic features: Secondary | ICD-10-CM | POA: Diagnosis present

## 2020-02-02 DIAGNOSIS — F1914 Other psychoactive substance abuse with psychoactive substance-induced mood disorder: Secondary | ICD-10-CM | POA: Diagnosis not present

## 2020-02-02 DIAGNOSIS — Z20822 Contact with and (suspected) exposure to covid-19: Secondary | ICD-10-CM | POA: Diagnosis present

## 2020-02-02 DIAGNOSIS — F1190 Opioid use, unspecified, uncomplicated: Secondary | ICD-10-CM | POA: Diagnosis present

## 2020-02-02 DIAGNOSIS — O285 Abnormal chromosomal and genetic finding on antenatal screening of mother: Secondary | ICD-10-CM | POA: Diagnosis present

## 2020-02-02 DIAGNOSIS — Z880 Allergy status to penicillin: Secondary | ICD-10-CM | POA: Diagnosis not present

## 2020-02-02 DIAGNOSIS — O99325 Drug use complicating the puerperium: Principal | ICD-10-CM | POA: Diagnosis present

## 2020-02-02 DIAGNOSIS — F1121 Opioid dependence, in remission: Secondary | ICD-10-CM | POA: Diagnosis present

## 2020-02-02 DIAGNOSIS — F1910 Other psychoactive substance abuse, uncomplicated: Secondary | ICD-10-CM | POA: Diagnosis not present

## 2020-02-02 DIAGNOSIS — R87810 Cervical high risk human papillomavirus (HPV) DNA test positive: Secondary | ICD-10-CM | POA: Diagnosis present

## 2020-02-02 DIAGNOSIS — O9833 Other infections with a predominantly sexual mode of transmission complicating the puerperium: Secondary | ICD-10-CM | POA: Diagnosis not present

## 2020-02-02 DIAGNOSIS — O99345 Other mental disorders complicating the puerperium: Secondary | ICD-10-CM | POA: Diagnosis not present

## 2020-02-02 DIAGNOSIS — Z3490 Encounter for supervision of normal pregnancy, unspecified, unspecified trimester: Secondary | ICD-10-CM

## 2020-02-03 ENCOUNTER — Other Ambulatory Visit: Payer: Self-pay

## 2020-02-03 ENCOUNTER — Encounter (HOSPITAL_COMMUNITY): Payer: Self-pay | Admitting: Obstetrics and Gynecology

## 2020-02-03 DIAGNOSIS — O9833 Other infections with a predominantly sexual mode of transmission complicating the puerperium: Secondary | ICD-10-CM

## 2020-02-03 DIAGNOSIS — F1914 Other psychoactive substance abuse with psychoactive substance-induced mood disorder: Secondary | ICD-10-CM

## 2020-02-03 DIAGNOSIS — B009 Herpesviral infection, unspecified: Secondary | ICD-10-CM

## 2020-02-03 DIAGNOSIS — O99325 Drug use complicating the puerperium: Secondary | ICD-10-CM

## 2020-02-03 DIAGNOSIS — F1190 Opioid use, unspecified, uncomplicated: Secondary | ICD-10-CM | POA: Diagnosis present

## 2020-02-03 LAB — CBC
HCT: 31.6 % — ABNORMAL LOW (ref 36.0–46.0)
Hemoglobin: 10.7 g/dL — ABNORMAL LOW (ref 12.0–15.0)
MCH: 32.1 pg (ref 26.0–34.0)
MCHC: 33.9 g/dL (ref 30.0–36.0)
MCV: 94.9 fL (ref 80.0–100.0)
Platelets: 358 10*3/uL (ref 150–400)
RBC: 3.33 MIL/uL — ABNORMAL LOW (ref 3.87–5.11)
RDW: 13.2 % (ref 11.5–15.5)
WBC: 17.6 10*3/uL — ABNORMAL HIGH (ref 4.0–10.5)
nRBC: 0 % (ref 0.0–0.2)

## 2020-02-03 LAB — RESP PANEL BY RT-PCR (FLU A&B, COVID) ARPGX2
Influenza A by PCR: NEGATIVE
Influenza B by PCR: NEGATIVE
SARS Coronavirus 2 by RT PCR: NEGATIVE

## 2020-02-03 LAB — RPR: RPR Ser Ql: NONREACTIVE

## 2020-02-03 LAB — TYPE AND SCREEN
ABO/RH(D): O POS
Antibody Screen: NEGATIVE

## 2020-02-03 MED ORDER — DIPHENHYDRAMINE HCL 25 MG PO CAPS: 25.0000 mg | ORAL_CAPSULE | Freq: Four times a day (QID) | ORAL | Status: DC | PRN

## 2020-02-03 MED ORDER — PRENATAL MULTIVITAMIN CH
1.0000 | ORAL_TABLET | Freq: Every day | ORAL | Status: DC
  Administered 2020-02-03 – 2020-02-04 (×2): 1 via ORAL
  Filled 2020-02-03 (×2): qty 1

## 2020-02-03 MED ORDER — BENZOCAINE-MENTHOL 20-0.5 % EX AERO
1.0000 "application " | INHALATION_SPRAY | CUTANEOUS | Status: DC | PRN
  Administered 2020-02-03: 1 via TOPICAL
  Filled 2020-02-03: qty 56

## 2020-02-03 MED ORDER — OXYCODONE-ACETAMINOPHEN 5-325 MG PO TABS: 2.0000 | ORAL_TABLET | ORAL | Status: DC | PRN

## 2020-02-03 MED ORDER — LIDOCAINE HCL (PF) 1 % IJ SOLN: 30.0000 mL | INTRAMUSCULAR | Status: DC | PRN

## 2020-02-03 MED ORDER — WITCH HAZEL-GLYCERIN EX PADS: 1.0000 "application " | MEDICATED_PAD | CUTANEOUS | Status: DC | PRN

## 2020-02-03 MED ORDER — SIMETHICONE 80 MG PO CHEW: 80.0000 mg | CHEWABLE_TABLET | ORAL | Status: DC | PRN

## 2020-02-03 MED ORDER — ONDANSETRON HCL 4 MG PO TABS: 4.0000 mg | ORAL_TABLET | ORAL | Status: DC | PRN

## 2020-02-03 MED ORDER — ONDANSETRON HCL 4 MG/2ML IJ SOLN: 4.0000 mg | Freq: Four times a day (QID) | INTRAMUSCULAR | Status: DC | PRN

## 2020-02-03 MED ORDER — COCONUT OIL OIL: 1.0000 "application " | TOPICAL_OIL | Status: DC | PRN

## 2020-02-03 MED ORDER — MEASLES, MUMPS & RUBELLA VAC IJ SOLR: 0.5000 mL | Freq: Once | INTRAMUSCULAR | Status: DC

## 2020-02-03 MED ORDER — FENTANYL CITRATE (PF) 100 MCG/2ML IJ SOLN: 50.0000 ug | INTRAMUSCULAR | Status: DC | PRN

## 2020-02-03 MED ORDER — ACETAMINOPHEN 325 MG PO TABS
650.0000 mg | ORAL_TABLET | ORAL | Status: DC | PRN
  Administered 2020-02-04: 650 mg via ORAL
  Filled 2020-02-03: qty 2

## 2020-02-03 MED ORDER — OXYTOCIN BOLUS FROM INFUSION: 333.0000 mL | Freq: Once | INTRAVENOUS | Status: DC

## 2020-02-03 MED ORDER — SENNOSIDES-DOCUSATE SODIUM 8.6-50 MG PO TABS
2.0000 | ORAL_TABLET | ORAL | Status: DC
  Administered 2020-02-03: 2 via ORAL
  Filled 2020-02-03: qty 2

## 2020-02-03 MED ORDER — OXYTOCIN 10 UNIT/ML IJ SOLN
10.0000 [IU] | Freq: Once | INTRAMUSCULAR | Status: AC
  Administered 2020-02-03: 10 [IU] via INTRAMUSCULAR
  Filled 2020-02-03: qty 1

## 2020-02-03 MED ORDER — LACTATED RINGERS IV SOLN: 500.0000 mL | INTRAVENOUS | Status: DC | PRN

## 2020-02-03 MED ORDER — OXYTOCIN-SODIUM CHLORIDE 30-0.9 UT/500ML-% IV SOLN: 2.5000 [IU]/h | INTRAVENOUS | Status: DC

## 2020-02-03 MED ORDER — DIBUCAINE (PERIANAL) 1 % EX OINT: 1.0000 "application " | TOPICAL_OINTMENT | CUTANEOUS | Status: DC | PRN

## 2020-02-03 MED ORDER — IBUPROFEN 600 MG PO TABS
600.0000 mg | ORAL_TABLET | Freq: Four times a day (QID) | ORAL | Status: DC
  Administered 2020-02-03 – 2020-02-04 (×7): 600 mg via ORAL
  Filled 2020-02-03 (×7): qty 1

## 2020-02-03 MED ORDER — SOD CITRATE-CITRIC ACID 500-334 MG/5ML PO SOLN: 30.0000 mL | ORAL | Status: DC | PRN

## 2020-02-03 MED ORDER — LACTATED RINGERS IV SOLN: INTRAVENOUS | Status: DC

## 2020-02-03 MED ORDER — ACETAMINOPHEN 325 MG PO TABS
650.0000 mg | ORAL_TABLET | ORAL | Status: DC | PRN
  Administered 2020-02-03: 650 mg via ORAL
  Filled 2020-02-03: qty 2

## 2020-02-03 MED ORDER — TETANUS-DIPHTH-ACELL PERTUSSIS 5-2.5-18.5 LF-MCG/0.5 IM SUSY: 0.5000 mL | PREFILLED_SYRINGE | Freq: Once | INTRAMUSCULAR | Status: DC

## 2020-02-03 MED ORDER — OXYCODONE-ACETAMINOPHEN 5-325 MG PO TABS: 1.0000 | ORAL_TABLET | ORAL | Status: DC | PRN

## 2020-02-03 MED ORDER — ONDANSETRON HCL 4 MG/2ML IJ SOLN: 4.0000 mg | INTRAMUSCULAR | Status: DC | PRN

## 2020-02-03 NOTE — Progress Notes (Signed)
Patient arrived to mother baby states that she need to use the restroom . Patient had 2 clots the size of a strawberry. Veronica Mensah called to notify Mart Piggs.  Dr. Germaine Pomfret states to continue to monitor the patient . Patient denies any dizziness ,vitals are within  Normal range . Will continue to monitor.

## 2020-02-03 NOTE — Plan of Care (Signed)
  Problem: Education: Goal: Knowledge of Childbirth will improve Outcome: Progressing Goal: Ability to make informed decisions regarding treatment and plan of care will improve Outcome: Progressing Goal: Ability to state and carry out methods to decrease the pain will improve Outcome: Progressing Goal: Individualized Educational Video(s) Outcome: Progressing   Problem: Coping: Goal: Ability to verbalize concerns and feelings about labor and delivery will improve Outcome: Progressing   Problem: Life Cycle: Goal: Ability to make normal progression through stages of labor will improve Outcome: Progressing Goal: Ability to effectively push during vaginal delivery will improve Outcome: Progressing   Problem: Role Relationship: Goal: Will demonstrate positive interactions with the child Outcome: Progressing   Problem: Safety: Goal: Risk of complications during labor and delivery will decrease Outcome: Progressing   Problem: Pain Management: Goal: Relief or control of pain from uterine contractions will improve Outcome: Progressing   Problem: Health Behavior/Discharge Planning: Goal: Ability to manage health-related needs will improve Outcome: Progressing   Problem: Clinical Measurements: Goal: Ability to maintain clinical measurements within normal limits will improve Outcome: Progressing Goal: Will remain free from infection Outcome: Progressing Goal: Diagnostic test results will improve Outcome: Progressing Goal: Respiratory complications will improve Outcome: Progressing Goal: Cardiovascular complication will be avoided Outcome: Progressing   Problem: Activity: Goal: Risk for activity intolerance will decrease Outcome: Progressing   Problem: Nutrition: Goal: Adequate nutrition will be maintained Outcome: Progressing   Problem: Elimination: Goal: Will not experience complications related to bowel motility Outcome: Progressing Goal: Will not experience  complications related to urinary retention Outcome: Progressing   Problem: Skin Integrity: Goal: Risk for impaired skin integrity will decrease Outcome: Progressing   

## 2020-02-03 NOTE — Progress Notes (Signed)
Dr Mart Piggs called stating that she had ordered 10 units IM Oxytocin since the patient did not get Oxytocin after delivery .Will continue to monitor

## 2020-02-03 NOTE — H&P (Signed)
OBSTETRIC ADMISSION HISTORY AND PHYSICAL  [name] is a 35 y.o. female [phone] with IUP at 79w6dby early u/s presenting after SVD at home. Patient reports she started contractions this morning and then noted a bulging bag which broke and she spontaneously delivered without issues. Her placenta was delivered by EMS en route to the hospital. She denies abdominal pain. She denies excessive bleeding. She reports +FMs, No LOF, no VB, no blurry vision, headaches or peripheral edema, and RUQ pain.  She plans on breast feeding. She request interval BTL for birth control, Medicaid, no papers signed. She [facility] early u/s --->  Estimated Date of Delivery: 02/25/20  Sono:    02/01/20@[redacted]w[redacted]d , CWD, normal anatomy, cephalic presentation, 28841Y 11% EFW   Prenatal History/Complications:  Heroin use this pregnancy Suboxone use Bipolar/borderline/PTSD/depression Custody of other children? 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 Depression   . Drug addiction in remission ([place]   . H/O gonorrhea   . HSV-2 infection   . Pregnancy complicated by subutex maintenance, antepartum ([place]   . SVD (spontaneous vaginal delivery) 04/17/2014    Past Surgical History: Past Surgical History:  Procedure Laterality Date  . BREAST BIOPSY Right   . BREAST SURGERY     reduction    Obstetrical History: OB History    Gravida  8   Para  3   Term  3   Preterm      AB  4   Living  3     SAB      IAB  4   Ectopic      Multiple  0   Live Births  3           Social History Social History   Socioeconomic History  . Marital status: Significant Other    Spouse name: Not on file  . Number of children: Not on file  . Years of education: Not on file  . Highest education level: Not on file  Occupational History  .  Not on file  Tobacco Use  . Smoking status: Current Every Day Smoker    Packs/day: 0.25    Years: 10.00    Pack years: 2.50    Types: Cigarettes  . Smokeless tobacco: Never Used  Vaping Use  . Vaping Use: Never used  Substance and Sexual Activity  . Alcohol use: No  . Drug use: Not Currently    Types: Heroin, Marijuana, Fentanyl    Comment: Currently on Subutex  . Sexual activity: Yes    Partners: Male    Birth control/protection: None  Other Topics Concern  . Not on file  Social History Narrative  . Not on file   Social Determinants of Health   Financial Resource Strain: Not on file  Food Insecurity: Not on file  Transportation Needs: Not on file  Physical Activity: Not on file  Stress: Not on file  Social Connections: Not on file    Family History: Family History  Problem Relation Age of Onset  . Breast cancer Mother   . Stroke Mother   . Breast cancer Maternal Grandmother   . Depression Sister     Allergies: Allergies  Allergen Reactions  . Penicillins Rash and Other (See Comments)    Causes yeast infection Has patient had a PCN reaction causing immediate rash, facial/tongue/throat swelling,  SOB or lightheadedness with hypotension: Yes Has patient had a PCN reaction causing severe rash involving mucus membranes or skin necrosis: No Has patient had a PCN reaction that required hospitalization: No Has patient had a PCN reaction occurring within the last 10 years: No If all of the above answers are "NO", then may proceed with Cephalosporin use.    Medications Prior to Admission  Medication Sig Dispense Refill Last Dose  . Blood Pressure Monitoring (BLOOD PRESSURE KIT) KIT 1 kit by Does not apply route daily. 1 kit 0   . buprenorphine (SUBUTEX) 8 MG SUBL SL tablet Place 12 mg under the tongue daily.     Marland Kitchen docusate sodium (COLACE) 100 MG capsule Take 1 capsule (100 mg total) by mouth 2 (two) times daily as needed. (Patient not taking: Reported on 01/02/2020) 60  capsule 2   . Prenatal Vit-Fe Fumarate-FA (PREPLUS) 27-1 MG TABS Take 1 tablet by mouth daily. 30 tablet 13   . valACYclovir (VALTREX) 500 MG tablet Take 1 tablet (500 mg total) by mouth 2 (two) times daily. Do not take until 36 weeks (Patient not taking: Reported on 01/24/2020) 60 tablet 3      Review of Systems   All systems reviewed and negative except as stated in HPI  Last menstrual period 04/30/2019, currently breastfeeding. 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  Patient Active Problem List   Diagnosis Date Noted  . Vaginal delivery 02/03/2020  . Heroin use 02/03/2020  . Polyhydramnios affecting pregnancy 12/19/2019  . Abnormal genetic test during pregnancy 09/19/2019  . 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 H/O gonorrhea   . Encounter for supervision of normal pregnancy, unspecified, unspecified trimester 07/19/2019  .  Cigarette nicotine dependence without complication 21/30/8657  . MDD (major depressive disorder) 04/11/2018  . Herpes simplex 03/09/2018  . Opioid dependence in remission ([place]) 10/17/2017  . PTSD (post-traumatic stress disorder) 10/17/2017  . Borderline personality disorder ([place]) 10/17/2017  . Opioid dependence with opioid-induced mood disorder ([place]) 10/17/2017  . Bipolar I disorder, current or most recent episode depressed, with psychotic features ([place]) 09/11/2017    Assessment/Plan:  Cenia Suppa is a 35 y.o. [id number] at 26w6dhere after SVD @ home.  #Postpartum: Doing well, without complaints. VSS. Bilateral labial lacerations noted, hemostatic.  #Pain: ibuprofen/tylenol postpartum #ID: GBS neg #MOF: breast #MOC: desires interval BTL, no papers signed, Medicaid. Discussed interval contraceptive options with patient. Message sent to schedule patient appt in clinic with MD in 1 week to discuss BTL and schedule. #Circ: will discuss tomorrow  #Heroin use/suboxone use: last heroin use 2  days ago, previously states she was clean x6 years and relapsed when trying to wean off suboxone. States she takes 59m TID. Patient desires to use home meds, will verify with pharmacy and order.  #Bipolar/borderline personality/PTSD/depression: not on meds. SW consult postpartum.  #Complex social situation: unknown custody of other children. SW.  #Hx HSV: reports outbreak at 6wk, no lesions on external exam. Not on suppression.   AArrie Senate MD  02/03/2020, 12:20 AM

## 2020-02-03 NOTE — Lactation Note (Addendum)
This note was copied from a baby's chart. Lactation Consultation Note  Patient Name: Boy Cyleigh Warth YBWLS'L Date: 02/03/2020 Reason for consult: L&D Initial assessment;Late-preterm 34-36.6wks Age:37 hours  L&D initial LC consult. Mom with hx: substance use heroin but been on subutex since 08/12/19 see notes.  Mom had home delivery, infant is LPTI born at 54 w 5days. Mom is experienced at breastfeeding, she BF 1st child for 3 months, her 2nd and 3rd child both for 2 years, her 3rd child is now 69 years old. Mom requested hand pump due not having breast pump at home and mom is not a [facility] participant , due to mom's nipple size she will need a 27 mm breast flange. LC entered L&D infant was cuing to BF, mom made few attempts to latch infant at first infant was not sustaining latch. Mom concern she did not have any colostrum in breast, LC assisted mom with hand expression and infant was given 5 mls of colostrum by spoon, afterwards infant started cuing again. Mom latched infant on her right breast using the football hold position, infant was on and off breast, but was still NF after 8 minutes when LC left the room. LC discussed mom to breastfeed infant according to hunger cues, 8 to 12+ times within 24 hours, STS. LC will set up DEBP in patient's room and have RN explain how to use DEBP and discussed with mom LPTI behaviors. Mom will receive green sheet in room, discussing LPTI feeding policy and how to use DEBP, RN will discussed this with mom. LC services  will follow up with mom and infant in [place]. ( No charge for [facility] services in L&D). Maternal Data Formula Feeding for Exclusion: No Has patient been taught Hand Expression?: Yes Does the patient have breastfeeding experience prior to this delivery?: Yes  Feeding Feeding Type: Breast Fed  LATCH Score Latch: Repeated attempts needed to sustain latch, nipple held in mouth throughout feeding, stimulation needed to elicit sucking  reflex.  Audible Swallowing: A few with stimulation  Type of Nipple: Everted at rest and after stimulation  Comfort (Breast/Nipple): Soft / non-tender  Hold (Positioning): Assistance needed to correctly position infant at breast and maintain latch.  LATCH Score: 7  Interventions Interventions: Breast feeding basics reviewed;Skin to skin;Assisted with latch;Support pillows;Adjust position;Breast compression;Hand express;Expressed milk  Lactation Tools Discussed/Used Tools: Pump Breast pump type: Double-Electric Breast Pump;Manual Pump Education: Setup, frequency, and cleaning;Milk Storage Initiated by:: LC set up but RN teach how to use Date initiated:: 02/03/20   Consult Status Consult Status: Follow-up Date: 02/03/20 Follow-up type: In-patient    Danelle Earthly 02/03/2020, 12:49 AM

## 2020-02-03 NOTE — Discharge Summary (Signed)
Postpartum Discharge Summary    Patient Name: [name] DOB: [id number] MRN: [id number]  Date of admission: 02/02/2020 Delivery date:02/02/2020  Delivering provider: Arrie Senate  Date of discharge: 02/04/2020  Admitting diagnosis: Vaginal delivery [O80] Intrauterine pregnancy: [redacted]w[redacted]d    Secondary diagnosis:  Active Problems:   Bipolar I disorder, current or most recent episode depressed, with psychotic features ([place]   Cigarette nicotine dependence without complication   Herpes simplex   MDD (major depressive disorder)   Opioid dependence in remission ([facility]   PTSD (post-traumatic stress disorder)   Encounter for supervision of normal pregnancy, unspecified, unspecified trimester   Cervical high risk HPV (human papillomavirus) test positive   Abnormal genetic test during pregnancy   Borderline personality disorder ([place]   Vaginal delivery   Heroin use  Additional problems: none    Discharge diagnosis: Preterm Pregnancy Delivered                                              Post partum procedures:none Augmentation: N/A Complications: None  Hospital course: Onset of Labor With Vaginal Delivery      35y.o. yo [id number] 325w6das admitted after home delivery of infant and placenta 02/02/2020. Patient had an uncomplicated labor course as follows:  Membrane Rupture Time/Date: 10:53 PM ,02/02/2020   Delivery Method:Vaginal, Spontaneous  Episiotomy: None  Lacerations:  Labial   Patient had a post partum course notable for intermittent use of her home rx of subutex (this was not prescribed while in house). Patient counseled extensively that it was recommended she continue MAT, however patient was adamant she wished to detox. Explained in detail that studies have shown risk of relapse and overdose leading to death are much higher in patients who choose this route. Recommended instead that she stay and titrate her dose of suboxone as per our conversation she did not seem to be  on an adequate dose to control her symptoms. Patient understood but plans to be discharged to home and will detox on her own there. Provided with symptomatic meds as well as narcan, stressed importance of returning to [facility] if she is worried she may relapse and runs out of subutex. Routine PP return precautions also discussed.   At time of discharge she is ambulating, tolerating a regular diet, passing flatus, and urinating well. Patient is discharged home in stable condition on 02/04/20.  Newborn Data: Birth date:02/02/2020  Birth time:10:53 PM  Gender:Female  Living status:Living  Apgars:9 ,9  Weight:2685 g   Magnesium Sulfate received: No BMZ received: No Rhophylac:N/A MMR:N/A T-DaP: previously declined Flu:  previously declined Transfusion:No  Physical exam  Vitals:   02/03/20 1000 02/03/20 1500 02/03/20 2040 02/04/20 0459  BP: 133/86 124/81 108/70 99/64  Pulse: 77 99 89 67  Resp: 20 20 18 18   Temp: 98.7 F (37.1 C) 98.7 F (37.1 C) (!) 97.5 F (36.4 C) 98 F (36.7 C)  TempSrc: Oral Oral Oral Oral  SpO2: 100% 100% 100% 100%   General: alert, cooperative and no distress Lochia: appropriate Uterine Fundus: firm Incision: N/A DVT Evaluation: No evidence of DVT seen on physical exam. No significant calf/ankle edema. Labs: Lab Results  Component Value Date   WBC 17.6 (H) 02/03/2020   HGB 10.7 (L) 02/03/2020   HCT 31.6 (L) 02/03/2020   MCV 94.9 02/03/2020   PLT 358 02/03/2020   CMP  Latest Ref Rng & Units 09/15/2017  Glucose 70 - 99 mg/dL 106(H)  BUN 6 - 20 mg/dL 17  Creatinine 0.44 - 1.00 mg/dL 0.88  Sodium 135 - 145 mmol/L 140  Potassium 3.5 - 5.1 mmol/L 3.8  Chloride 98 - 111 mmol/L 104  CO2 22 - 32 mmol/L 26  Calcium 8.9 - 10.3 mg/dL 9.7  Total Protein 6.5 - 8.1 g/dL 8.6(H)  Total Bilirubin 0.3 - 1.2 mg/dL 0.7  Alkaline Phos 38 - 126 U/L 54  AST 15 - 41 U/L 16  ALT 0 - 44 U/L 13   Edinburgh Score: No flowsheet data found.   After visit meds:  Allergies  as of 02/04/2020      Reactions   Penicillins Rash, Other (See Comments)   Causes yeast infection Has patient had a PCN reaction causing immediate rash, facial/tongue/throat swelling, SOB or lightheadedness with hypotension: Yes Has patient had a PCN reaction causing severe rash involving mucus membranes or skin necrosis: No Has patient had a PCN reaction that required hospitalization: No Has patient had a PCN reaction occurring within the last 10 years: No If all of the above answers are "NO", then may proceed with Cephalosporin use.      Medication List    STOP taking these medications   valACYclovir 500 MG tablet Commonly known as: VALTREX     TAKE these medications   acetaminophen 325 MG tablet Commonly known as: Tylenol Take 2 tablets (650 mg total) by mouth every 4 (four) hours as needed (for pain scale < 4).   Blood Pressure Kit Kit 1 kit by Does not apply route daily.   buprenorphine 8 MG Subl SL tablet Commonly known as: SUBUTEX Place 12 mg under the tongue daily.   cloNIDine 0.1 MG tablet Commonly known as: Catapres Take 1 tablet (0.1 mg total) by mouth 2 (two) times daily.   docusate sodium 100 MG capsule Commonly known as: COLACE Take 1 capsule (100 mg total) by mouth 2 (two) times daily as needed.   ibuprofen 600 MG tablet Commonly known as: ADVIL Take 1 tablet (600 mg total) by mouth every 6 (six) hours.   loperamide 2 MG tablet Commonly known as: Imodium A-D Take 1 tablet (2 mg total) by mouth 4 (four) times daily as needed for diarrhea or loose stools.   naloxone 4 MG/0.1ML Liqd nasal spray kit Commonly known as: NARCAN Use as needed in case of over dose   PrePLUS 27-1 MG Tabs Take 1 tablet by mouth daily.        Discharge home in stable condition Infant Feeding: Breast Infant Disposition:Nursery pending CPS evaluation Discharge instruction: per After Visit Summary and Postpartum booklet. Activity: Advance as tolerated. Pelvic rest for 6  weeks.  Diet: routine diet Future Appointments: Future Appointments  Date Time Provider Div[place]  02/12/2020  1:45 PM Woodroe Mode, MD [place] None  02/12/2020  2:15 PM Lynnea Ferrier, LCSW [place] None  03/07/2020 10:00 AM Woodroe Mode, MD [place] None   Follow up Visit: Message sent to [facility] by Sylvester Harder 02/03/19.   Please schedule this patient for a In person postpartum visit in 4 weeks with the following provider: Any provider. Additional Postpartum F/U:Postpartum Depression checkup and BTL consent appt with MD in 1 week  High risk pregnancy complicated by: heroin use, suboxone use Delivery mode:  Vaginal, Spontaneous  Anticipated Birth Control:  Plans Interval BTL   02/04/2020 Clarnce Flock, MD

## 2020-02-03 NOTE — Discharge Instructions (Signed)

## 2020-02-03 NOTE — Clinical Social Work Maternal (Addendum)
CLINICAL SOCIAL WORK MATERNAL/CHILD NOTE  Patient Details  Name: [name] MRN: [id number] Date of Birth: 05/31/1985  Date:  02/03/2020  Clinical Social Worker Initiating Note:  Shenicka Sunderlin, LCSW Date/Time: Initiated:  02/03/20/1100     Child's Name:  [name]   Biological Parents:  Mother,Father (Bri Millis, Tyler [name])   Need for Interpreter:  None   Reason for Referral:  Behavioral Health Concerns,Current Substance Use/Substance Use During Pregnancy ,Other (Comment) (questions regarding custody.)   Address:  [address] [place] [address]    Phone number:  [phone] (home)     Additional phone number: none given   Household Members/Support Persons (HM/SP):   Household Member/Support Person 1   HM/SP Name Relationship DOB or Age  HM/SP -1  Tineshia Dedominicis  MOB 07/04/1983  HM/SP -2 Tyler [name]  FOB  01/27/1989  HM/SP -3        HM/SP -4        HM/SP -5        HM/SP -6        HM/SP -7        HM/SP -8          Natural Supports (not living in the home):    MOB reported no other supports to CSW aside from her parents and FOB.   Professional Supports: Therapist,Organized support group (Comment) ([facility])   Employment: Unemployed   Type of Work: none   Education:  High school graduate   Homebound arranged:  n/a  Financial Resources:  Medicaid   Other Resources:  Food Stamps ,WIC   Cultural/Religious Considerations Which May Impact Care:  none reported.   Strengths:  Pediatrician chosen,Home prepared for child    Psychotropic Medications:       MOB reported that she was on Subtex but reported that she has been weaning off the past 5 days.   Pediatrician:    [place] area  Pediatrician List:   [facility]  [place]    [place]    [place]    [place]    [place]      Pediatrician Fax Number:    Risk Factors/Current Problems:  Substance Use  ,Mental Health Concerns    Cognitive State:  Alert    Mood/Affect:  Interested ,Comfortable ,Calm ,Relaxed    CSW Assessment: CSW consulted due to"Bipolar, depression, borderline personality, ptsd, heroin use 2 days ago, subutex previously, unsure if patient has custody of other children". CSW went to speak with MOB at bedside to address further needs.   CSW congratulated MOB and FOB on the birth of infant. CSW advised MOB of the HIPPA policy in which MOB was agreeable to having FOB leave room. CSW then advised MOB of CSW's role and the reason for CSW coming to speak with her. MOB expressed that she was never diagnosed with Bipolar, but does report that she has a hx of depression. PTSD,and Borderline Personality Disorder. MOB expressed that she was diagnosed with these diagnosis about 4 years ago. MOB expressed that she was given PTSD diagnosis in 2013, however. MOB expressed that she was on a number of different medications in the past, however MOB expressed that she stopped taking her mental health mediations due to "the doctor reported that it may have been increasing my depression". MOB expressed that she is in therapy with counselor Diamonique Johnson with [facility]. MOB also advised CSW that she is seen at [facility] for her subutex use treatment. MOB   expressed that she works with a Therapist, sports, counselor and a therapist at this practice. MOB expressed no desire for further resources at this time when asked by CSW.   CSW inquired from [facility] on her subutex use while pregnant. CSW started off with asking MOB about how long she has been on this medication. MOB indicated that she started Subutex around June early July 2021. MOB expressed that she has been weaning herself off this medication for the last 5 days hence MOB reports "I think this is what sent me into labor". CSW asked MOB about using Heroin two days ago as MOB's H&P states that MOB used heroin 2 days ago. When asked about this,  MOB immediately denied using heroin or any other substances while pregnant. MOB expressed that she was clean for 6 years and the relapsed 2 years ago reason for the previous CPS report. MOB reported that she only took her PNV and remained on her subutex throughout her entire pregancy. .CSW advised MOB of the hospital drug screen policy for the use of Subutex while pregnant. MOB expressed that she understood and expressed no other questions on policy. CSW asked MOB about CPS hx currently and in the past in which MOB expressed that she has CPS hx with her last child due to substance use. MOB expressed that this is the reason that she was started on Subutex.  she has three oldest children ages 17,14, and 5. CSW inquired from [facility] on where her children were. MOB reported to CSW that her youngest Kennon Portela 04/17/2014 has been  with her father Melrose Nakayama since around November 2021 in [place]. MOB expressed that she and Gianna's father both have custody of this child and that [place] with dad due to "this pregnancy and  COVID in general". MOB expressed that she see's child and visits with child but child is currently with her father. CSW asked MOB if this was done by CPS in which MOB expressed that it wasn't. MOB also reported that the case she did have with child  "I think the case was closed in 2021 but I am not sure because I never received any paperwork or anything". MOB questioned CSW about why CSW asked questions around CPS. CSW educated MOB on CSW's role and advised MOB that CSW usually asks about other children and if there is any CPS involvement. MOB reported "this shouldn't have anything to do with me taking our son home". MOB expressed concerns with current FOB not being aware of previous hx and MOB reported that she  doesn't want him (FOB) to suffer for her previous choices. CSW validated these feelings and advised MOB that CSW would follow up with [facility] CPS to confirm CPS hx and the  closings of them. CSW was advised by MOB that her oldest two children live with their dad. MOB chose to not give any information on children when asked by CSW aside from their ages. MOB expressed that she felt as if other Childrens information was "breaking HIPPA" and reported "I am so glad that he (FOB) did leave because this is a little embarrassing". CSW apologized for making MOB feel this way but again reported to MOB that CSW was only asking to ensure that CSW had correct information. MOB expressed that with her older children their father and her were divorced "so I left them with him and left because that was their home" CSW understanding and again asked MOB if she still has custody of those  other two children in which MOB only reported "I still have my rights". In asking MOB about custody of her children MOB reported that she has custody of children and expressed that CPS has been involved with her family but not involved in children being with other people. CSW did speak with on call CPS worker with [facility]. [facility] two times and was advised that per CPS system MOB and daughters father has split custody of [place]. Per C. Keys MOB was recently/already  investigated for Substance use (reason for last report earlier in this pregnancy) and that case was closed out in August 2021 without ongoing services. Also per C. Keys, CPS never took custody of MOB's previous child.   CSW then inquired from [facility] on who her supports are. MOB expressed that she has support from FOB and her parents. MOB expressed that she has a basinet for infant as well as other supplies. MOB reported that she gets Sales executive and has plan to call and follow up with [facility] on Monday. MOB expressed to CSW that she plans for infant to be seen at [facility] for further care once arrived home. CSW took time to provide MOB with PPD and SIDS education. MOB was given PPD Checklist in order to keep track of feelings as they may relate to PPD.  MOB thanked CSW and expressed no other needs.   At this time CSW has not made CPS report due  to no positive drug  screens on infant or mother during this pregnancy. CSW also noted that based off MOB's report regarding her children and CPS previous findings cases are closed at this time.   CSW will continue to monitor infants UDS and CDS and make CPS report if warranted. CSW will make CC4C and Health Start Referrals.   CSW Plan/Description:  Sudden Infant Death Syndrome (SIDS) Education,Perinatal Mood and Anxiety Disorder (PMADs) Education,Neonatal Abstinence Syndrome (NAS) Education,Hospital Drug Screen Policy Information,CSW Will Continue to Monitor Umbilical Cord Tissue Drug Screen Results and Make Report if Celso Sickle, LCSWA 05-06-20, 1:45 PM

## 2020-02-03 NOTE — Progress Notes (Signed)
Post Partum Day 1 Subjective: no complaints, up ad lib, voiding and tolerating PO  States she weaned off Subutex 5 days ago, feels well. She is being managed at [facility].   Objective: Blood pressure (!) 113/92, pulse 72, temperature 98.6 F (37 C), resp. rate 18, last menstrual period 04/30/2019, SpO2 100 %, unknown if currently breastfeeding.  Physical Exam:  General: alert, cooperative and no distress Lochia: appropriate Uterine Fundus: firm Incision: healing well, no significant drainage, no dehiscence, no significant erythema DVT Evaluation: No evidence of DVT seen on physical exam. Negative Homan's sign. No cords or calf tenderness. No significant calf/ankle edema.  Recent Labs    02/03/20 0044  HGB 10.7*  HCT 31.6*    Assessment/Plan: Opioid dependence: stable, consulted with Dr. Vergie Living, will reassess in am for possible restart of Subutex Borderline HTN: no hx, watch closely SW consult pending Consider discharge home tomorrow   LOS: 1 day   Donette Larry, CNM 02/03/2020, 12:07 PM

## 2020-02-04 ENCOUNTER — Ambulatory Visit: Payer: Medicaid Other

## 2020-02-04 DIAGNOSIS — O99325 Drug use complicating the puerperium: Principal | ICD-10-CM

## 2020-02-04 DIAGNOSIS — F1910 Other psychoactive substance abuse, uncomplicated: Secondary | ICD-10-CM

## 2020-02-04 DIAGNOSIS — F315 Bipolar disorder, current episode depressed, severe, with psychotic features: Secondary | ICD-10-CM

## 2020-02-04 DIAGNOSIS — F17210 Nicotine dependence, cigarettes, uncomplicated: Secondary | ICD-10-CM

## 2020-02-04 DIAGNOSIS — O99335 Smoking (tobacco) complicating the puerperium: Secondary | ICD-10-CM

## 2020-02-04 DIAGNOSIS — F4310 Post-traumatic stress disorder, unspecified: Secondary | ICD-10-CM

## 2020-02-04 DIAGNOSIS — F1190 Opioid use, unspecified, uncomplicated: Secondary | ICD-10-CM

## 2020-02-04 DIAGNOSIS — O99345 Other mental disorders complicating the puerperium: Secondary | ICD-10-CM

## 2020-02-04 DIAGNOSIS — F603 Borderline personality disorder: Secondary | ICD-10-CM

## 2020-02-04 MED ORDER — NALOXONE HCL 4 MG/0.1ML NA LIQD: NASAL | 1 refills | Status: DC

## 2020-02-04 MED ORDER — BUPRENORPHINE HCL-NALOXONE HCL 2-0.5 MG SL SUBL: 2.0000 | SUBLINGUAL_TABLET | Freq: Once | SUBLINGUAL | Status: DC

## 2020-02-04 MED ORDER — IBUPROFEN 600 MG PO TABS: 600.0000 mg | ORAL_TABLET | Freq: Four times a day (QID) | ORAL | 0 refills | Status: DC

## 2020-02-04 MED ORDER — CLONIDINE HCL 0.1 MG PO TABS: 0.1000 mg | ORAL_TABLET | Freq: Two times a day (BID) | ORAL | 0 refills | Status: DC

## 2020-02-04 MED ORDER — HYDROXYZINE HCL 25 MG PO TABS
25.0000 mg | ORAL_TABLET | Freq: Once | ORAL | Status: AC
  Administered 2020-02-04: 25 mg via ORAL
  Filled 2020-02-04: qty 1

## 2020-02-04 MED ORDER — CLONAZEPAM 1 MG PO TABS
1.0000 mg | ORAL_TABLET | Freq: Two times a day (BID) | ORAL | Status: DC
  Administered 2020-02-04 (×2): 1 mg via ORAL
  Filled 2020-02-04 (×2): qty 1

## 2020-02-04 MED ORDER — ACETAMINOPHEN 325 MG PO TABS: 650.0000 mg | ORAL_TABLET | ORAL | 0 refills | Status: DC | PRN

## 2020-02-04 MED ORDER — LOPERAMIDE HCL 2 MG PO TABS: 2.0000 mg | ORAL_TABLET | Freq: Four times a day (QID) | ORAL | 0 refills | Status: DC | PRN

## 2020-02-04 NOTE — Progress Notes (Addendum)
3:13pm- CSW escorted CPS worker M. Ellerby to MOB's room. CPS worker to call CSW once finished.     CSW spoke with M. Ellerby advising CSW that she would likely call into MOB's room to speak with her but also plans to come and visit with MOB. CSW advised M. Ellerby that per chart infant was in nursery due to MOB feeling anxious. M. Ellerby to call CSW back one arrived to hospital. CSW will then escort her to room.    Jorgia Manthei S. Josefa Syracuse, MSW, LCSW [facility] [place] [phone]   

## 2020-02-04 NOTE — Progress Notes (Addendum)
9:44am- CSW spoke with Avel Sensor with [place] CPS and was advised that case was assigned to [facility] .Ellerby [phone] and was assigned as a 72 hour response time. CSW reached out to [facility]. [place] and left voicemail at this time requesting call back.   7:36am-CSW spoke with on call social worker with [place] CPS , C. Keys to make CPS report regarding concerns. CSW awaiting for case to be assigned. Barrier's remain to infant discharging to MOB.    CSW received update from OB MD advising CSW that MOB reported to her as well as other RN's that she used heroin 2 days ago compared to MOB reporting to CSW that she used 2 years ago. CSW also updated that MOB admitted to RN that she used heroin one day prior to giving birth to infant.   CSW has reached out to [facility] on call CPS worker to make CPS report at this time regarding concerns. CSW awaiting call back.    Barrier's to discharge at this time.     [name], MSW, LCSW [facility] [place] [phone]

## 2020-02-04 NOTE — Progress Notes (Signed)
[name] stated that she had taken heroin one hour before she went into labor and that she was withdrawing. Stated that she needed something for the antiexty Notified Dr. Mart Piggs. Dr. Germaine Pomfret  gave a verbal order for 25 mg hydroxyzine once. Medication was given to the patient . Will continue to monitor. Mom wanted the baby to go to the nursery.   Marland Kitchen

## 2020-02-04 NOTE — Progress Notes (Signed)
POSTPARTUM PROGRESS NOTE  Subjective: [name] is a 35 y.o. [id number] s/p SVD at home at [redacted]w[redacted]d.  She denies any problems with ambulating, voiding or po intake. Denies nausea or vomiting. She has passed flatus. Pain is moderately controlled.  Lochia is minimal.  Patient does complain of feeling hot and anxious, and when asked when was the last time she used Heroin, she stated " I have been using this whole time" She states she last took Subutex 6 hours ago.   Objective: Blood pressure 99/64, pulse 67, temperature 98 F (36.7 C), temperature source Oral, resp. rate 18, last menstrual period 04/30/2019, SpO2 100 %, unknown if currently breastfeeding.  Physical Exam:  General: alert, cooperative and no distress Chest: no respiratory distress Abdomen: soft, non-tender  Uterine Fundus: firm and at level of umbilicus Extremities: No calf swelling or tenderness  no edema  Recent Labs    02/03/20 0044  HGB 10.7*  HCT 31.6*    Assessment/Plan: [name] is a 35 y.o. [id number] s/p SVD at home at [redacted]w[redacted]d.   Routine Postpartum Care: Doing well, pain well-controlled.  -- Continue routine care, lactation support  -- Contraception: BTL outpatient  -- Feeding: Formula   Opioid dependence  Patient reports Heroin use and subutex during pregnancy. Dr. Crissie Reese to see patient today and possibly start suboxone.  - SW consulted, awaiting CPS call back  - f/u with Dr. Crissie Reese in clinic   Dispo: Ensuring patient not actively withdrawing and a safe dispo is planned   Cora Collum, DO OB Fellow, Faculty Practice 02/04/2020 10:30 AM

## 2020-02-06 ENCOUNTER — Encounter: Payer: Medicaid Other | Admitting: Obstetrics and Gynecology

## 2020-02-06 LAB — SURGICAL PATHOLOGY

## 2020-02-11 DIAGNOSIS — F1020 Alcohol dependence, uncomplicated: Secondary | ICD-10-CM | POA: Insufficient documentation

## 2020-02-12 ENCOUNTER — Encounter: Payer: Medicaid Other | Admitting: Licensed Clinical Social Worker

## 2020-02-12 ENCOUNTER — Ambulatory Visit: Payer: Medicaid Other | Admitting: Obstetrics & Gynecology

## 2020-02-12 ENCOUNTER — Other Ambulatory Visit: Payer: Self-pay

## 2020-02-12 ENCOUNTER — Ambulatory Visit: Payer: Self-pay

## 2020-02-19 ENCOUNTER — Ambulatory Visit: Payer: Medicaid Other

## 2020-02-22 ENCOUNTER — Other Ambulatory Visit (INDEPENDENT_AMBULATORY_CARE_PROVIDER_SITE_OTHER): Payer: Self-pay | Admitting: Pediatric Genetics

## 2020-02-22 DIAGNOSIS — Z8489 Family history of other specified conditions: Secondary | ICD-10-CM

## 2020-02-22 NOTE — Progress Notes (Signed)
Saw this patient's son in genetics clinic who has a diagnosis of 22q11.2 deletion syndrome. Parental testing is desired. This is important for determining if this was inherited from either parent as this could have larger health implications for the parent and their other children.   Placing order for FISH study for 22q11.2 region.   Loletha Grayer, DO [facility] Pediatric Genetics

## 2020-03-07 ENCOUNTER — Ambulatory Visit: Payer: Medicaid Other | Admitting: Obstetrics & Gynecology

## 2020-04-01 ENCOUNTER — Ambulatory Visit (INDEPENDENT_AMBULATORY_CARE_PROVIDER_SITE_OTHER): Payer: Medicaid Other | Admitting: Licensed Clinical Social Worker

## 2020-04-01 ENCOUNTER — Ambulatory Visit (INDEPENDENT_AMBULATORY_CARE_PROVIDER_SITE_OTHER): Payer: Medicaid Other | Admitting: Advanced Practice Midwife

## 2020-04-01 ENCOUNTER — Encounter: Payer: Self-pay | Admitting: Advanced Practice Midwife

## 2020-04-01 ENCOUNTER — Other Ambulatory Visit: Payer: Self-pay

## 2020-04-01 DIAGNOSIS — Z658 Other specified problems related to psychosocial circumstances: Secondary | ICD-10-CM

## 2020-04-01 DIAGNOSIS — Z87898 Personal history of other specified conditions: Secondary | ICD-10-CM | POA: Diagnosis not present

## 2020-04-01 DIAGNOSIS — Z3009 Encounter for other general counseling and advice on contraception: Secondary | ICD-10-CM

## 2020-04-01 NOTE — Progress Notes (Signed)
[place] Partum Visit Note  [name] is a 35 y.o. [phone] female who presents for a postpartum visit. She is 8 weeks postpartum following a normal spontaneous vaginal delivery.  I have fully reviewed the prenatal and intrapartum course. The delivery was at 36.6 gestational weeks.  Anesthesia: none. Postpartum course has been complicated with resource activation for patient's desire to detox (see notes from Dr. Dione Plover). Baby is doing well. Baby is feeding by bottle - Similac Neosure. Bleeding no bleeding. Bowel function is normal. Bladder function is normal. Patient is not sexually active. Contraception method is discuss options. . Postpartum depression screening: negative. Score: 2   The pregnancy intention screening data noted above was reviewed. Potential methods of contraception were discussed. The patient elected to proceed with Abstinence. She is living in a female only recovery house while her partner and baby live in his home. She is open to considering contraception options but does not wish to pursue a BTL.  The following portions of the patient's history were reviewed and updated as appropriate: allergies, current medications, past family history, past medical history, past social history, past surgical history and problem list.  Review of Systems A comprehensive review of systems was negative.    Objective:  BP 129/80 (BP Location: Right Arm, Patient Position: Sitting, Cuff Size: Normal)    Pulse 83    Ht 5' 4"  (1.626 m)    Wt 127 lb (57.6 kg)    LMP 04/30/2019    Breastfeeding No    BMI 21.80 kg/m    General:  alert, cooperative, appears stated age and no distress   Breasts:  negative  Lungs: No problems with respiration noted  Heart:  regular rate and rhythm, S1, S2 normal, no murmur, click, rub or gallop  Abdomen: soft, non-tender; bowel sounds normal; no masses,  no organomegaly   Vulva:  not evaluated  Vagina: not evaluated        Assessment:    Normal  postpartum exam. Patient doing very well with managing stress related to recovery. Met with Seth Bake, LCSW during visit today. Contraception options reviewed, emphasized convenience and reliability of LARCs. Pap smear not done at today's visit.   Plan:   Essential components of care per ACOG recommendations:  1.  Mood and well being: Patient with negative depression screening today. Reviewed local resources for support.  - Patient does use tobacco.   2. Infant care and feeding:  -Patient currently breastmilk feeding? No   3. Sexuality, contraception and birth spacing - Reviewed forms of contraception in tiered fashion. Patient with history of Depo use as a teen, considering return to Depo or LARC - Discussed birth spacing of 18 months  4. Sleep and fatigue -Encouraged family/partner/community support of 4 hrs of uninterrupted sleep to help with mood and fatigue  5. Physical Recovery  - Discussed patients delivery and complications - Patient had bilateral hemostatic labial lacerations, perineal healing reviewed. Patient expressed understanding - Patient has urinary incontinence? No - Patient is safe to resume physical and sexual activity with condom use as not breastfeeding, no other contraception initiated  6.  Health Maintenance - Last pap smear done 08/22/2020 and was normal with negative HPV.   7. Recovery planning - Patient in recovery house. Sees Psychiatrist once per week and has access to other counseling resources weekly. Due to ongoing recovery, will dovetail gyn care with Dr. Dione Plover, consider moving appointments to [facility] for continuity.   Patient may return at her convenience  for contraception initiation  Beth Snooks, MSN, CNM Certified Nurse Midwife, Product/process development scientist for [employer], [place]

## 2020-04-01 NOTE — BH Specialist Note (Signed)
Integrated Behavioral Health Initial In-Person Visit  MRN: [id number] Name: Maximino Greenland  Number of Integrated Behavioral Health Clinician visits:: Session Start time: 1:46pm  Session End time: 2:13pm Total time: 27 minutes in person at [facility]   Types of Service: General Behavioral health   Interpretor:no  Interpretor Name and Language: none    Warm Hand Off Completed.       Subjective: Monie Minkler is a 35 y.o. female accompanied by n/a Patient was referred by [facility] for stress . Patient reports the following symptoms/concerns: h/o substance use and stress  Duration of problem: approx one year ; Severity of problem: mild  Objective: Mood: good  and Affect: appropriate  Risk of harm to self or others: no risk of harm to self or others   Life Context: Family and Social: Lives in transitional housing for recovery  School/Work: Going back to school  Self-Care: n/a Life Changes: Working on recovery   Patient and/or Family's Strengths/Protective Factors: Secured connections in place   Goals Addressed: Patient will: 1. Reduce symptoms of: stress  2. Increase knowledge and/or ability of: copimg skills  3. Demonstrate ability to: self manage symptoms   Progress towards Goals: Ongoing   Interventions: Interventions utilized: supportive counseling   Standardized Assessments completed: n/a   Assessment Patient currently experiencing psychosocial stress and h/o substance use    Patient may benefit from outpatient therapy services   Plan: 1. Follow up with behavioral health clinician on : as needed 2. Behavioral recommendations: continue services with therapist, harm reduction of substance use and engage in safe stress reducing activity such as walking, yoga, mediatation  3. Referral(s): n/a 4. "From scale of 1-10, how likely are you to follow plan?":   Gwyndolyn Saxon, LCSW

## 2020-04-01 NOTE — Patient Instructions (Signed)

## 2021-04-14 IMAGING — US US MFM OB FOLLOW-UP
1 series · 13 of 28 positions shown · non-contrast
Comparison: none

[Series 1: us mfm ob follow-up · 13 of 48 slices shown]
[im 2/48]
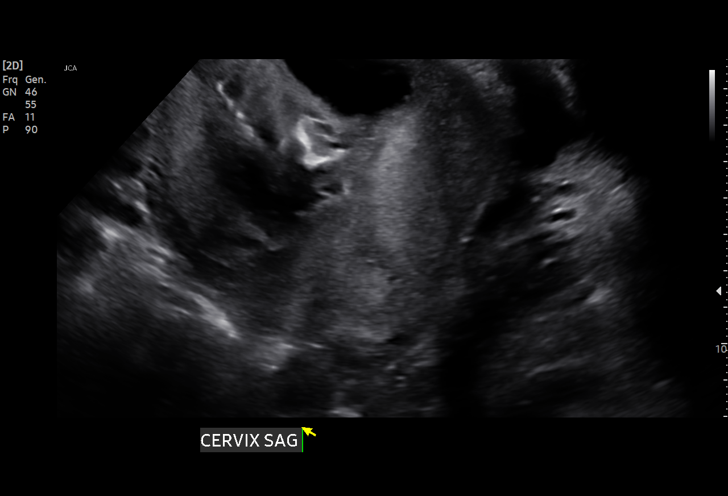
[im 6/48]
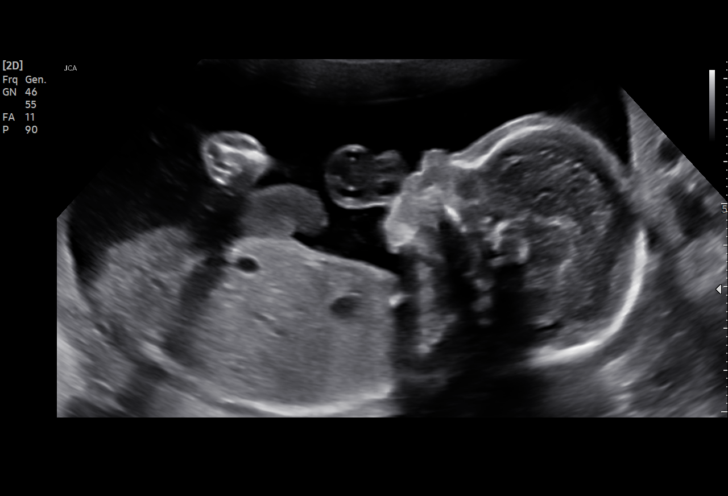
[im 9/48]
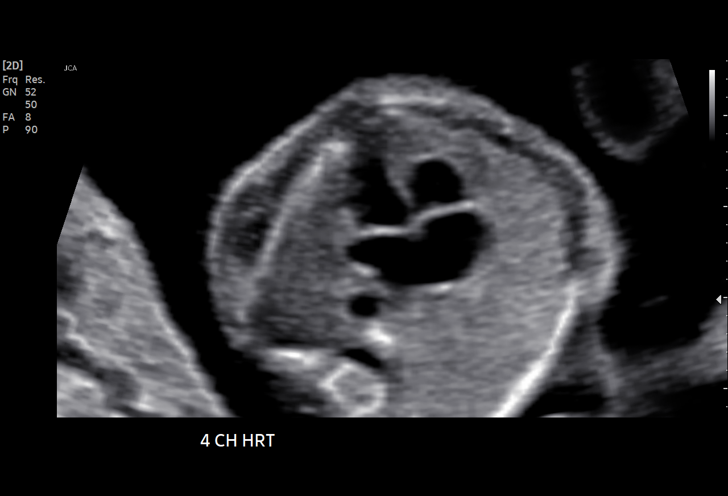
[im 13/48]
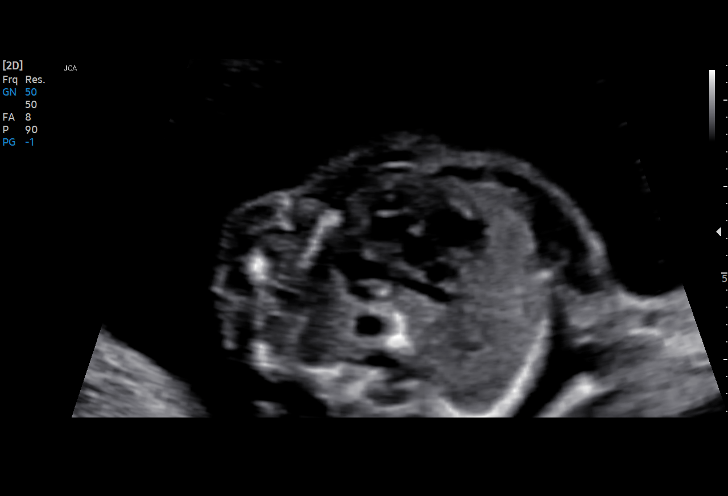
[im 16/48]
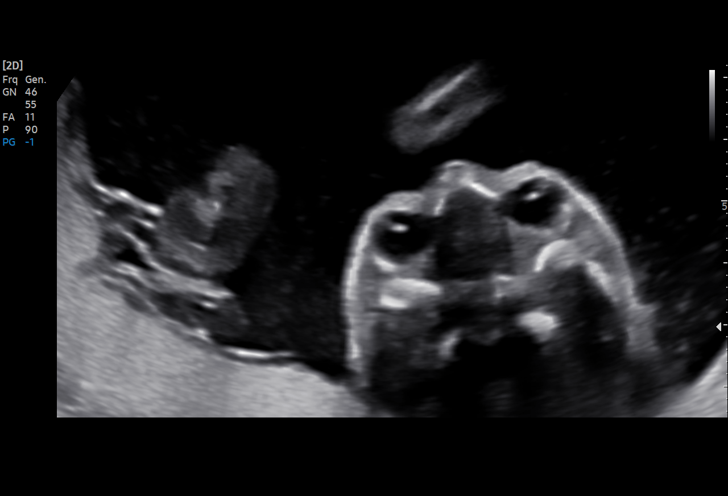
[im 20/48]
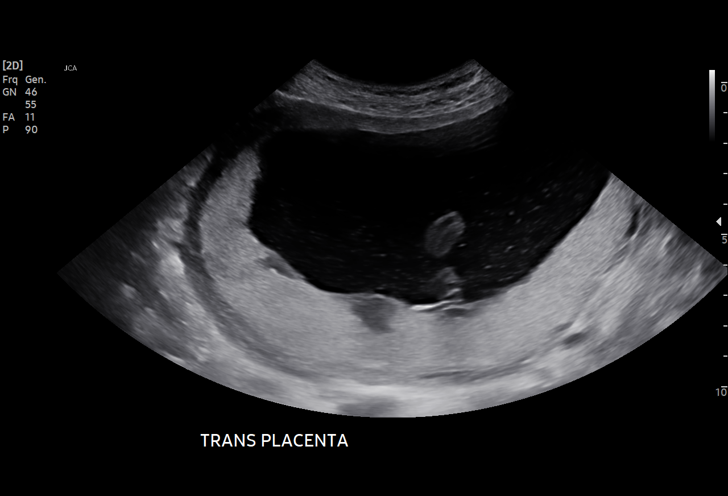
[im 25/48]
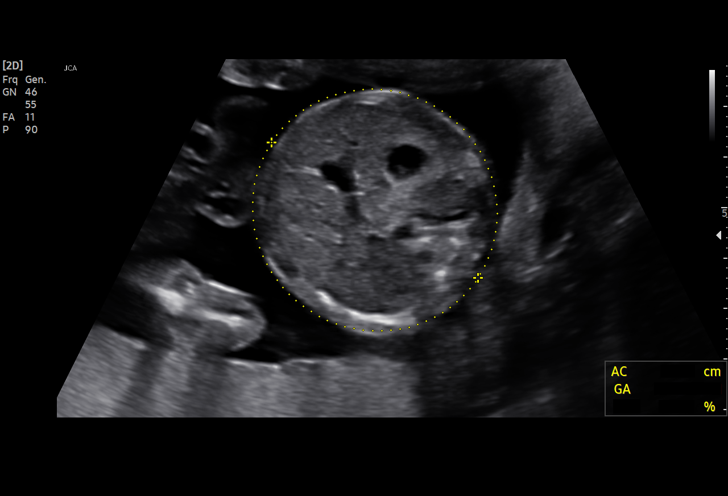
[im 28/48]
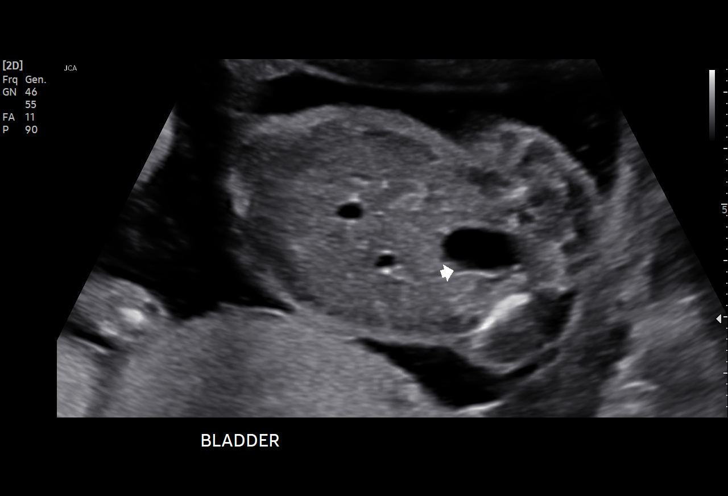
[im 32/48]
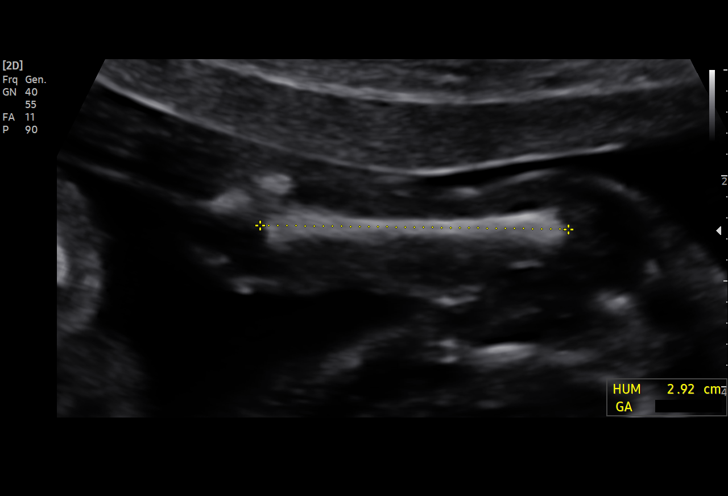
[im 35/48]
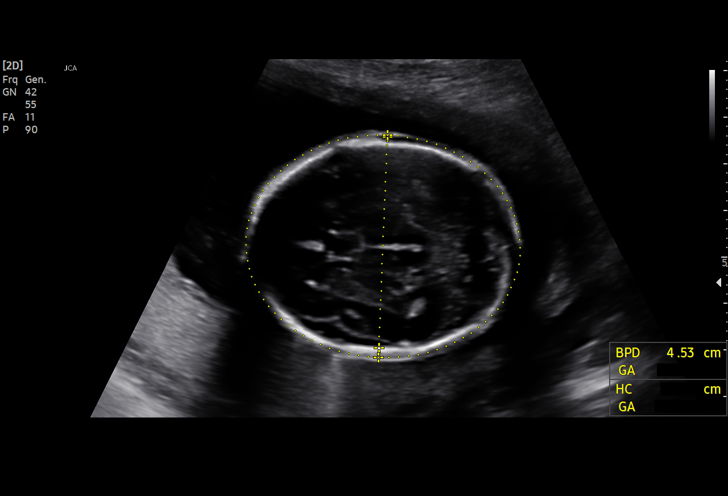
[im 39/48]
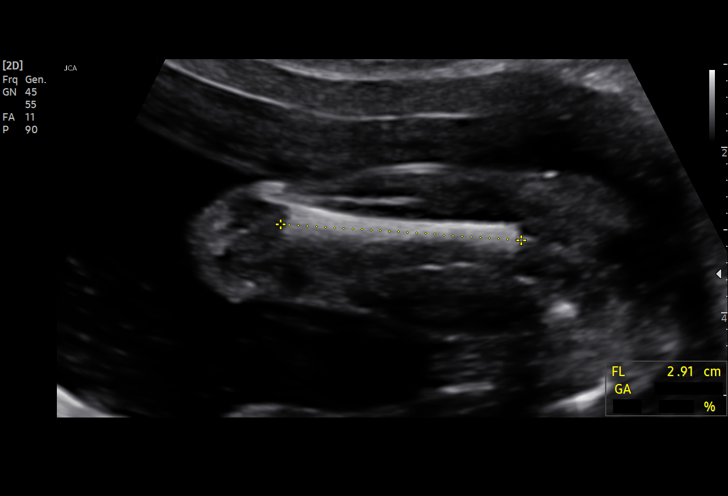
[im 42/48]
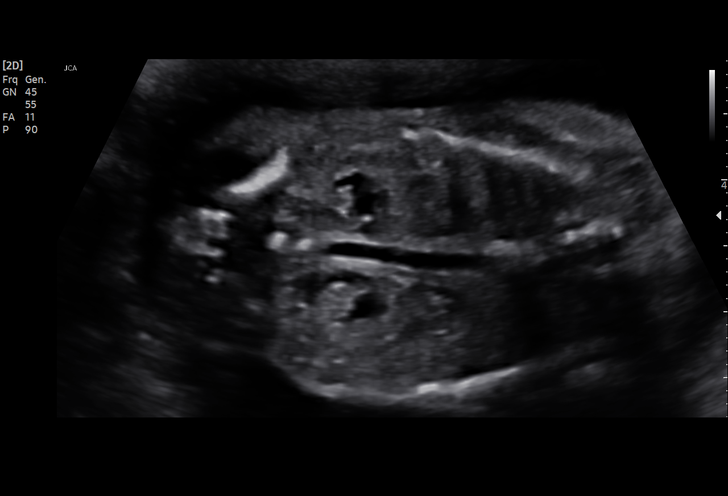
[im 46/48]
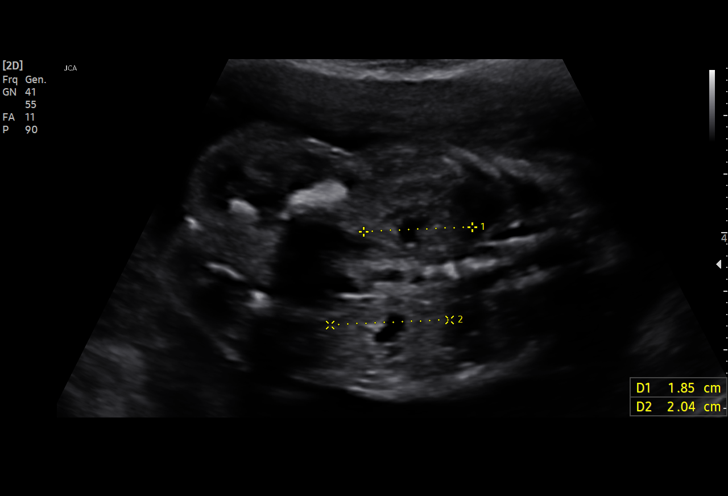

[13 of 28 positions shown; findings below may reference images not displayed]

Indications

 Abnormal chromosomal and genetic finding
 on antenatal screening of mother (11q66.6
 deletion)
 Drug use complicating pregnancy, second
 trimester (subutex and gabapentin)
 Advanced maternal age multigravida 35+,
 second trimester
 Encounter for antenatal screening for
 malformations
 19 weeks gestation of pregnancy
Fetal Evaluation

 Num Of Fetuses:         1
 Fetal Heart Rate(bpm):  152
 Cardiac Activity:       Observed
 Presentation:           Transverse, head to maternal left
 Placenta:               Posterior
 P. Cord Insertion:      Visualized

 Amniotic Fluid
 AFI FV:      Within normal limits

                             Largest Pocket(cm)

Biometry
 BPD:      44.6  mm     G. Age:  19w 3d         65  %    CI:        72.88   %    70 - 86
                                                         FL/HC:      17.7   %    16.1 -
 HC:      166.1  mm     G. Age:  19w 2d         51  %    HC/AC:      1.11        1.09 -
 AC:      149.9  mm     G. Age:  20w 2d         80  %    FL/BPD:     65.9   %
 FL:       29.4  mm     G. Age:  19w 0d         40  %    FL/AC:      19.6   %    20 - 24
 HUM:      29.3  mm     G. Age:  19w 4d         61  %

 Est. FW:     305  gm    0 lb 11 oz      76  %
Gestational Age

 U/S Today:     19w 4d                                        EDD:   02/22/20
 Best:          19w 1d     Det. By:  U/S C R L  (07/19/19)    EDD:   02/25/20
Anatomy

 Cranium:               Appears normal         Aortic Arch:            Appears normal
 Cavum:                 Appears normal         Ductal Arch:            Appears normal
 Ventricles:            Appears normal         Diaphragm:              Appears normal
 Choroid Plexus:        Appears normal         Stomach:                Appears normal, left
                                                                       sided
 Cerebellum:            Previously seen        Abdomen:                Appears normal
 Posterior Fossa:       Previously seen        Abdominal Wall:         Previously seen
 Nuchal Fold:           Not applicable (>20    Cord Vessels:           Previously seen
                        wks GA)
 Face:                  Appears normal         Kidneys:                Appear normal
                        (orbits and profile)
 Lips:                  Appears normal         Bladder:                Appears normal
 Thoracic:              Appears normal         Spine:                  Previously seen
 Heart:                 Appears normal         Upper Extremities:      Previously seen
                        (4CH, axis, and
                        situs)
 RVOT:                  Appears normal         Lower Extremities:      Previously seen
 LVOT:                  Appears normal

 Other:  Fetus appears to be a male. Hands and feet visualized. 5th digit
         visualized. Technically difficult due to fetal position. Technically
         difficult due to fetal position.
Cervix Uterus Adnexa

 Cervix
 Length:           3.64  cm.
 Normal appearance by transabdominal scan.
Impression

 Patient returns for fetal growth assessment.  She is on
 Subutex maintenance and also reports taking gabapentin 300
 mg once daily.  On cell free fetal DNA screening, the risk for
 22 q 11.2 deletion was increased.  Patient had fetal
 echocardiography today and was reassured of normal
 cardiac anatomy.Patient opted not to have amniocentesis.

 Amniotic fluid is normal and good fetal activity is seen .Fetal
 growth is appropriate for gestational age .  Cardiac anatomy
 appears normal.
Recommendations

 -An appointment was made for her to return in 6 weeks for
 fetal growth assessment.
                 Molha, Avelino Pereira

## 2021-05-07 ENCOUNTER — Encounter (HOSPITAL_BASED_OUTPATIENT_CLINIC_OR_DEPARTMENT_OTHER): Payer: Self-pay | Admitting: Emergency Medicine

## 2021-05-07 ENCOUNTER — Emergency Department (HOSPITAL_BASED_OUTPATIENT_CLINIC_OR_DEPARTMENT_OTHER)
Admission: EM | Admit: 2021-05-07 | Discharge: 2021-05-07 | Disposition: A | Discharge status: Home / Self Care | Payer: Medicaid Other | Attending: Emergency Medicine | Admitting: Emergency Medicine

## 2021-05-07 ENCOUNTER — Other Ambulatory Visit: Payer: Self-pay

## 2021-05-07 DIAGNOSIS — N760 Acute vaginitis: Secondary | ICD-10-CM | POA: Diagnosis not present

## 2021-05-07 DIAGNOSIS — N898 Other specified noninflammatory disorders of vagina: Secondary | ICD-10-CM | POA: Diagnosis present

## 2021-05-07 DIAGNOSIS — B9689 Other specified bacterial agents as the cause of diseases classified elsewhere: Secondary | ICD-10-CM | POA: Insufficient documentation

## 2021-05-07 DIAGNOSIS — L739 Follicular disorder, unspecified: Secondary | ICD-10-CM | POA: Insufficient documentation

## 2021-05-07 DIAGNOSIS — F439 Reaction to severe stress, unspecified: Secondary | ICD-10-CM | POA: Insufficient documentation

## 2021-05-07 LAB — URINALYSIS, ROUTINE W REFLEX MICROSCOPIC
Bilirubin Urine: NEGATIVE
Glucose, UA: NEGATIVE mg/dL
Hgb urine dipstick: NEGATIVE
Ketones, ur: NEGATIVE mg/dL
Leukocytes,Ua: NEGATIVE
Nitrite: NEGATIVE
Protein, ur: NEGATIVE mg/dL
Specific Gravity, Urine: 1.025 (ref 1.005–1.030)
pH: 5.5 (ref 5.0–8.0)

## 2021-05-07 LAB — PREGNANCY, URINE: Preg Test, Ur: NEGATIVE

## 2021-05-07 LAB — WET PREP, GENITAL
Sperm: NONE SEEN
Trich, Wet Prep: NONE SEEN
WBC, Wet Prep HPF POC: >=10 — AB (ref <10)
Yeast Wet Prep HPF POC: NONE SEEN

## 2021-05-07 MED ORDER — METRONIDAZOLE 500 MG PO TABS: 500.0000 mg | ORAL_TABLET | Freq: Two times a day (BID) | ORAL | 0 refills | Status: AC

## 2021-05-07 MED ORDER — CLINDAMYCIN PHOS-BENZOYL PEROX 1-5 % EX GEL: Freq: Two times a day (BID) | CUTANEOUS | 0 refills | Status: AC

## 2021-05-07 NOTE — ED Triage Notes (Signed)
Pt here from [facility] with c/o some urinary frequency and vaginal discharge , no burning or bleeding   ? ?

## 2021-05-07 NOTE — ED Provider Notes (Signed)
?[facility] DEPARTMENT ?Provider Note ? ? ?CSN: [id number] ?Arrival date & time: 05/07/21  [id number] ? ?  ? ?History ?PMH: Polysubstance use disorder (heroin/fentanyl), bipolar 1, PTSD, hx STD (herpes, gonorrhea, HPV), BPD ? ?No chief complaint on file. ? ? ?[name] is a 36 y.o. female. ?Presents to the emergency department the chief complaint of charge.  She states that about last week she had a genital herpes outbreak where she tarted her Valtrex.  She she said a few days after this, she started having white cottage cheese discharge.  She said it is itchy.  She is also had some frequent urination.  She says she has not been sexually active since 2022/11/25.  She is okay with getting tested for other STDs though because her husband passed away in 2022-11-25, but he was with multiple partners and has given her STDs before.  Denies dysuria or fevers.  Denies pelvic pain. ? ?She also says she has been having a lot of increased stress because she is currently in [facility] for rehab.  She has a bed clean from fentanyl use for about 3 weeks after having multiple overdoses in February.  She states she has a flare folliculitis on her buttocks.  Clindamycin usually helps this. ? ? ?HPI ? ?  ? ?Home Medications ?Prior to Admission medications   ?Medication Sig Start Date End Date Taking? Authorizing Provider  ?clindamycin-benzoyl peroxide (BENZACLIN) gel Apply topically 2 (two) times daily for 7 days. Use For folliculitis [id number] Yes Herschel Fleagle, Adora Fridge, PA-C  ?metroNIDAZOLE (FLAGYL) 500 MG tablet Take 1 tablet (500 mg total) by mouth 2 (two) times daily for 7 days. 05/07/21 05/14/21 Yes Jonita Hirota, Adora Fridge, PA-C  ?naloxone [facility]) nasal spray 4 mg/0.1 mL Use as needed in case of over dose 02/04/20   Clarnce Flock, MD  ?SUBOXONE 8-2 MG FILM Place 1 Film under the tongue 3 (three) times daily. 03/29/20   [provider]  ?ARIPiprazole (ABILIFY) 10 MG tablet Take 1 tablet (10 mg total) by mouth  daily. For mood control 09/14/17 04/26/18  Lindell Spar I, NP  ?gabapentin (NEURONTIN) 300 MG capsule Take 1 capsule (300 mg total) by mouth 3 (three) times daily. Agitation 09/14/17 04/26/18  Lindell Spar I, NP  ?traZODone (DESYREL) 100 MG tablet Take 1 tablet (100 mg total) by mouth at bedtime as needed for sleep. 09/14/17 04/26/18  Encarnacion Slates, NP  ?   ? ?Allergies    ?Penicillins   ? ?Review of Systems   ?Review of Systems  ?Endocrine: Positive for polyuria.  ?Genitourinary:  Positive for vaginal discharge.  ?Skin:  Positive for rash.  ?All other systems reviewed and are negative. ? ?Physical Exam ?Updated Vital Signs ?BP 121/89   Pulse 86   Temp 98.3 ?F (36.8 ?C)   Resp 18   LMP 04/20/2021 (Exact Date)   SpO2 100%  ?Physical Exam ?Vitals and nursing note reviewed.  ?Constitutional:   ?   General: She is not in acute distress. ?   Appearance: Normal appearance. She is well-developed. She is not ill-appearing, toxic-appearing or diaphoretic.  ?HENT:  ?   Head: Normocephalic and atraumatic.  ?   Nose: No nasal deformity.  ?   Mouth/Throat:  ?   Lips: Pink. No lesions.  ?Eyes:  ?   General: Gaze aligned appropriately. No scleral icterus.    ?   Right eye: No discharge.     ?   Left eye: No discharge.  ?  Conjunctiva/sclera: Conjunctivae normal.  ?   Right eye: Right conjunctiva is not injected. No exudate or hemorrhage. ?   Left eye: Left conjunctiva is not injected. No exudate or hemorrhage. ?Pulmonary:  ?   Effort: Pulmonary effort is normal. No respiratory distress.  ?Skin: ?   General: Skin is warm and dry.  ?Neurological:  ?   Mental Status: She is alert and oriented to person, place, and time.  ?Psychiatric:     ?   Mood and Affect: Mood normal.     ?   Speech: Speech normal.     ?   Behavior: Behavior normal. Behavior is cooperative.  ? ? ?ED Results / Procedures / Treatments   ?Labs ?(all labs ordered are listed, but only abnormal results are displayed) ?Labs Reviewed  ?WET PREP, GENITAL - Abnormal; Notable  for the following components:  ?    Result Value  ? Clue Cells Wet Prep HPF POC PRESENT (*)   ? WBC, Wet Prep HPF POC >=10 (*)   ? All other components within normal limits  ?PREGNANCY, URINE  ?URINALYSIS, ROUTINE W REFLEX MICROSCOPIC  ?GC/CHLAMYDIA PROBE AMP () NOT AT [facility]  ? ? ?EKG ?None ? ?Radiology ?No results found. ? ?Procedures ?Procedures  ? ? ?Medications Ordered in ED ?Medications - No data to display ? ?ED Course/ Medical Decision Making/ A&P ?  ? ?                        ?Medical Decision Making ?Amount and/or Complexity of Data Reviewed ?Labs: ordered. ? ?Risk ?Prescription drug management. ? ? ? ?MDM  ?This is a 36 y.o. female who presents to the ED with vaginal discharge and folliculitis ?The differential of this patient includes but is not limited to STD, UTI, pregnancy, yeast infection, bacterial vaginosis ? ?My Impression, Plan, and ED Course: Patient appears well with normal vitals.  She is not currently sexually active, but did recently have very high risk behaviors so once we checked for STIs as well as for yeast infection.  Symptoms do seem consistent with yeast.  No evidence of PID on hx or exam. ? ?Also dealing with a folliculitis flare on her buttocks. ? ?I personally ordered, reviewed, and interpreted all laboratory work and imaging and agree with radiologist interpretation. Results interpreted below: UA with no evidence of urinary tract infection.  Pregnancy negative.  Wet prep is positive for clue cells and more than 10 white blood cells. ? ?Need to treat for bacterial vaginosis.  7-day course of Flagyl. ?We will also provide with clindamycin gel for folliculitis on buttocks. ? ? ?Charting Requirements ?Additional history is obtained from:  Independent historian ?External Records from outside source obtained and reviewed including: Recent ED visit for seizures in February, Last visit for vaginal discharge in August 2022. Reviewed history of positive STDs ?Social Determinants of  Health:  Alcoholism/Drug Addiction ?Pertinant PMH that complicates patient's illness: hx of STD ? ?Patient Care ?Problems that were addressed during this visit: ?- Folliculitis: Acute illness with complication ?- Bacterial Vaginosis: Acute illness with complication ?I have reviewed home medications and made changes accordingly. Prescribed flagyl and clinda gel ?Disposition: return precautions if needed ? ?Portions of this note were generated with Lobbyist. Dictation errors may occur despite best attempts at proofreading. ?  ? ?  ?Final Clinical Impression(s) / ED Diagnoses ?Final diagnoses:  ?Bacterial vaginosis  ?Folliculitis  ? ? ?Rx / DC Orders ?ED Discharge Orders   ? ?  Ordered  ?  clindamycin-benzoyl peroxide (BENZACLIN) gel  2 times daily       ? 05/07/21 0949  ?  metroNIDAZOLE (FLAGYL) 500 MG tablet  2 times daily       ? 05/07/21 0949  ? ?  ?  ? ?  ? ? ?  ?Adolphus Birchwood, PA-C ?05/07/21 [id number] ? ?  ?Gareth Morgan, MD ?05/07/21 1722 ? ?

## 2021-05-07 NOTE — Discharge Instructions (Signed)
You tested positive for bacterial vaginosis. No yeast found today. I have prescribed you Metronidazole for 7 days. Please take this as prescribed. ? ?I have also prescribed you the Clindamycin gel for your folliculitis. ? ?You should follow up on your gonorrhea and chlamydia test. These will be available tomorrow. Someone should call you if they are positive. If positive, you will require additional antibiotics. Please f/u with PCP or return here if continued symptoms.  ?

## 2021-05-11 LAB — GC/CHLAMYDIA PROBE AMP (~~LOC~~) NOT AT ARMC
Chlamydia: NEGATIVE
Comment: NEGATIVE
Comment: NORMAL
Neisseria Gonorrhea: NEGATIVE

## 2021-07-21 ENCOUNTER — Ambulatory Visit (INDEPENDENT_AMBULATORY_CARE_PROVIDER_SITE_OTHER): Payer: Medicaid Other | Admitting: Obstetrics and Gynecology

## 2021-07-21 ENCOUNTER — Encounter: Payer: Self-pay | Admitting: Obstetrics and Gynecology

## 2021-07-21 ENCOUNTER — Other Ambulatory Visit (HOSPITAL_COMMUNITY)
Admission: RE | Admit: 2021-07-21 | Discharge: 2021-07-21 | Disposition: A | Discharge status: Home / Self Care | Payer: Medicaid Other | Source: Ambulatory Visit | Attending: Obstetrics and Gynecology | Admitting: Obstetrics and Gynecology

## 2021-07-21 VITALS — BP 94/63 | HR 69 | Ht 64.0 in | Wt 135.2 lb

## 2021-07-21 DIAGNOSIS — Z113 Encounter for screening for infections with a predominantly sexual mode of transmission: Secondary | ICD-10-CM

## 2021-07-21 DIAGNOSIS — A599 Trichomoniasis, unspecified: Secondary | ICD-10-CM | POA: Diagnosis not present

## 2021-07-21 DIAGNOSIS — O3680X0 Pregnancy with inconclusive fetal viability, not applicable or unspecified: Secondary | ICD-10-CM | POA: Diagnosis not present

## 2021-07-21 DIAGNOSIS — R87810 Cervical high risk human papillomavirus (HPV) DNA test positive: Secondary | ICD-10-CM

## 2021-07-21 DIAGNOSIS — Z87898 Personal history of other specified conditions: Secondary | ICD-10-CM | POA: Insufficient documentation

## 2021-07-21 DIAGNOSIS — F1310 Sedative, hypnotic or anxiolytic abuse, uncomplicated: Secondary | ICD-10-CM | POA: Insufficient documentation

## 2021-07-22 LAB — HEPATITIS B SURFACE ANTIGEN: Hepatitis B Surface Ag: NEGATIVE

## 2021-07-22 LAB — RPR: RPR Ser Ql: NONREACTIVE

## 2021-07-22 LAB — BETA HCG QUANT (REF LAB): hCG Quant: 2258 m[IU]/mL

## 2021-07-22 LAB — HIV ANTIBODY (ROUTINE TESTING W REFLEX): HIV Screen 4th Generation wRfx: NONREACTIVE

## 2021-07-22 LAB — HEPATITIS C ANTIBODY: Hep C Virus Ab: NONREACTIVE

## 2021-07-23 LAB — CERVICOVAGINAL ANCILLARY ONLY
Chlamydia: NEGATIVE
Comment: NEGATIVE
Comment: NEGATIVE
Comment: NORMAL
Neisseria Gonorrhea: NEGATIVE
Trichomonas: POSITIVE — AB

## 2021-07-23 MED ORDER — METRONIDAZOLE 500 MG PO TABS: ORAL_TABLET | ORAL | 0 refills | Status: DC

## 2021-07-23 NOTE — Addendum Note (Signed)
Addended by: Milas Hock A on: 07/23/2021 04:47 PM   Modules accepted: Orders

## 2021-07-29 ENCOUNTER — Ambulatory Visit (HOSPITAL_BASED_OUTPATIENT_CLINIC_OR_DEPARTMENT_OTHER): Admission: RE | Admit: 2021-07-29 | Payer: Medicaid Other | Source: Ambulatory Visit

## 2021-07-30 LAB — CYTOLOGY - PAP
Adequacy: ABNORMAL
Comment: NEGATIVE

## 2021-08-30 ENCOUNTER — Emergency Department (HOSPITAL_COMMUNITY): Payer: Medicaid Other

## 2021-08-30 ENCOUNTER — Observation Stay (HOSPITAL_COMMUNITY)
Admission: EM | Admit: 2021-08-30 | Discharge: 2021-08-31 | Payer: Medicaid Other | Attending: Internal Medicine | Admitting: Internal Medicine

## 2021-08-30 ENCOUNTER — Other Ambulatory Visit: Payer: Self-pay

## 2021-08-30 DIAGNOSIS — E860 Dehydration: Secondary | ICD-10-CM | POA: Diagnosis not present

## 2021-08-30 DIAGNOSIS — E876 Hypokalemia: Principal | ICD-10-CM | POA: Diagnosis present

## 2021-08-30 DIAGNOSIS — F17210 Nicotine dependence, cigarettes, uncomplicated: Secondary | ICD-10-CM | POA: Insufficient documentation

## 2021-08-30 DIAGNOSIS — R112 Nausea with vomiting, unspecified: Secondary | ICD-10-CM

## 2021-08-30 DIAGNOSIS — D72829 Elevated white blood cell count, unspecified: Secondary | ICD-10-CM | POA: Diagnosis not present

## 2021-08-30 DIAGNOSIS — F1910 Other psychoactive substance abuse, uncomplicated: Secondary | ICD-10-CM

## 2021-08-30 DIAGNOSIS — X838XXA Intentional self-harm by other specified means, initial encounter: Secondary | ICD-10-CM | POA: Diagnosis present

## 2021-08-30 DIAGNOSIS — T1491XA Suicide attempt, initial encounter: Secondary | ICD-10-CM

## 2021-08-30 DIAGNOSIS — Z79899 Other long term (current) drug therapy: Secondary | ICD-10-CM | POA: Diagnosis not present

## 2021-08-30 DIAGNOSIS — F32A Depression, unspecified: Secondary | ICD-10-CM

## 2021-08-30 DIAGNOSIS — T71162A Asphyxiation due to hanging, intentional self-harm, initial encounter: Secondary | ICD-10-CM

## 2021-08-30 DIAGNOSIS — T71192A Asphyxiation due to mechanical threat to breathing due to other causes, intentional self-harm, initial encounter: Secondary | ICD-10-CM | POA: Diagnosis present

## 2021-08-30 LAB — COMPREHENSIVE METABOLIC PANEL
ALT: 11 U/L (ref 0–44)
AST: 16 U/L (ref 15–41)
Albumin: 5.9 g/dL — ABNORMAL HIGH (ref 3.5–5.0)
Alkaline Phosphatase: 78 U/L (ref 38–126)
Anion gap: 17 — ABNORMAL HIGH (ref 5–15)
BUN: 19 mg/dL (ref 6–20)
CO2: 31 mmol/L (ref 22–32)
Calcium: 10.3 mg/dL (ref 8.9–10.3)
Chloride: 91 mmol/L — ABNORMAL LOW (ref 98–111)
Creatinine, Ser: 0.97 mg/dL (ref 0.44–1.00)
GFR, Estimated: >60 mL/min (ref >60)
Glucose, Bld: 112 mg/dL — ABNORMAL HIGH (ref 70–99)
Potassium: 2.6 mmol/L — CL (ref 3.5–5.1)
Sodium: 139 mmol/L (ref 135–145)
Total Bilirubin: 1.2 mg/dL (ref 0.3–1.2)
Total Protein: 10.7 g/dL — ABNORMAL HIGH (ref 6.5–8.1)

## 2021-08-30 LAB — RAPID URINE DRUG SCREEN, HOSP PERFORMED
Amphetamines: NOT DETECTED
Barbiturates: NOT DETECTED
Benzodiazepines: NOT DETECTED
Cocaine: NOT DETECTED
Opiates: NOT DETECTED
Tetrahydrocannabinol: POSITIVE — AB

## 2021-08-30 LAB — SALICYLATE LEVEL: Salicylate Lvl: <7 mg/dL — ABNORMAL LOW (ref 7.0–30.0)

## 2021-08-30 LAB — RENAL FUNCTION PANEL
Albumin: 4.5 g/dL (ref 3.5–5.0)
Anion gap: 12 (ref 5–15)
BUN: 12 mg/dL (ref 6–20)
CO2: 25 mmol/L (ref 22–32)
Calcium: 9.1 mg/dL (ref 8.9–10.3)
Chloride: 105 mmol/L (ref 98–111)
Creatinine, Ser: 0.75 mg/dL (ref 0.44–1.00)
GFR, Estimated: >60 mL/min (ref >60)
Glucose, Bld: 102 mg/dL — ABNORMAL HIGH (ref 70–99)
Phosphorus: 2.7 mg/dL (ref 2.5–4.6)
Potassium: 3.5 mmol/L (ref 3.5–5.1)
Sodium: 142 mmol/L (ref 135–145)

## 2021-08-30 LAB — CBC
HCT: 46.1 % — ABNORMAL HIGH (ref 36.0–46.0)
Hemoglobin: 16 g/dL — ABNORMAL HIGH (ref 12.0–15.0)
MCH: 32.7 pg (ref 26.0–34.0)
MCHC: 34.7 g/dL (ref 30.0–36.0)
MCV: 94.1 fL (ref 80.0–100.0)
Platelets: 380 10*3/uL (ref 150–400)
RBC: 4.9 MIL/uL (ref 3.87–5.11)
RDW: 13.2 % (ref 11.5–15.5)
WBC: 12.1 10*3/uL — ABNORMAL HIGH (ref 4.0–10.5)
nRBC: 0 % (ref 0.0–0.2)

## 2021-08-30 LAB — URINALYSIS, ROUTINE W REFLEX MICROSCOPIC
Bacteria, UA: NONE SEEN
Bilirubin Urine: NEGATIVE
Glucose, UA: NEGATIVE mg/dL
Ketones, ur: 80 mg/dL — AB
Nitrite: NEGATIVE
Protein, ur: 30 mg/dL — AB
Specific Gravity, Urine: 1.028 (ref 1.005–1.030)
pH: 7 (ref 5.0–8.0)

## 2021-08-30 LAB — MAGNESIUM: Magnesium: 2.1 mg/dL (ref 1.7–2.4)

## 2021-08-30 LAB — PHOSPHORUS: Phosphorus: 3.9 mg/dL (ref 2.5–4.6)

## 2021-08-30 LAB — HCG, QUANTITATIVE, PREGNANCY: hCG, Beta Chain, Quant, S: 2 m[IU]/mL (ref <5)

## 2021-08-30 LAB — ETHANOL: Alcohol, Ethyl (B): <10 mg/dL (ref <10)

## 2021-08-30 LAB — ACETAMINOPHEN LEVEL: Acetaminophen (Tylenol), Serum: <10 ug/mL — ABNORMAL LOW (ref 10–30)

## 2021-08-30 MED ORDER — THIAMINE HCL 100 MG/ML IJ SOLN
100.0000 mg | Freq: Every day | INTRAMUSCULAR | Status: DC
  Filled 2021-08-30: qty 2

## 2021-08-30 MED ORDER — SODIUM CHLORIDE 0.9 % IV SOLN: INTRAVENOUS | Status: DC

## 2021-08-30 MED ORDER — FOLIC ACID 1 MG PO TABS
1.0000 mg | ORAL_TABLET | Freq: Every day | ORAL | Status: DC
Start: 2021-08-30 — End: 2021-08-31
  Administered 2021-08-30 – 2021-08-31 (×2): 1 mg via ORAL
  Filled 2021-08-30 (×2): qty 1

## 2021-08-30 MED ORDER — LORAZEPAM 2 MG/ML IJ SOLN
0.5000 mg | Freq: Once | INTRAMUSCULAR | Status: AC
  Administered 2021-08-30: 0.5 mg via INTRAVENOUS
  Filled 2021-08-30: qty 1

## 2021-08-30 MED ORDER — THIAMINE HCL 100 MG PO TABS
100.0000 mg | ORAL_TABLET | Freq: Every day | ORAL | Status: DC
  Administered 2021-08-30 – 2021-08-31 (×2): 100 mg via ORAL
  Filled 2021-08-30 (×2): qty 1

## 2021-08-30 MED ORDER — PROCHLORPERAZINE EDISYLATE 10 MG/2ML IJ SOLN
10.0000 mg | Freq: Once | INTRAMUSCULAR | Status: AC
  Administered 2021-08-30: 10 mg via INTRAVENOUS
  Filled 2021-08-30: qty 2

## 2021-08-30 MED ORDER — METHOCARBAMOL 500 MG PO TABS
500.0000 mg | ORAL_TABLET | Freq: Three times a day (TID) | ORAL | Status: DC | PRN
  Administered 2021-08-31: 500 mg via ORAL
  Filled 2021-08-30: qty 1

## 2021-08-30 MED ORDER — LORAZEPAM 2 MG/ML IJ SOLN
1.0000 mg | INTRAMUSCULAR | Status: DC | PRN
  Administered 2021-08-30 – 2021-08-31 (×3): 2 mg via INTRAVENOUS
  Filled 2021-08-30 (×3): qty 1

## 2021-08-30 MED ORDER — ADULT MULTIVITAMIN W/MINERALS CH
1.0000 | ORAL_TABLET | Freq: Every day | ORAL | Status: DC
  Administered 2021-08-30 – 2021-08-31 (×2): 1 via ORAL
  Filled 2021-08-30 (×2): qty 1

## 2021-08-30 MED ORDER — ENOXAPARIN SODIUM 40 MG/0.4ML IJ SOSY
40.0000 mg | PREFILLED_SYRINGE | INTRAMUSCULAR | Status: DC
  Administered 2021-08-30: 40 mg via SUBCUTANEOUS
  Filled 2021-08-30: qty 0.4

## 2021-08-30 MED ORDER — NICOTINE 14 MG/24HR TD PT24
14.0000 mg | MEDICATED_PATCH | Freq: Every day | TRANSDERMAL | Status: DC | PRN
Start: 2021-08-30 — End: 2021-08-31

## 2021-08-30 MED ORDER — LORAZEPAM 1 MG PO TABS
1.0000 mg | ORAL_TABLET | Freq: Once | ORAL | Status: AC
  Administered 2021-08-30: 1 mg via ORAL
  Filled 2021-08-30: qty 1

## 2021-08-30 MED ORDER — DICYCLOMINE HCL 20 MG PO TABS: 20.0000 mg | ORAL_TABLET | Freq: Four times a day (QID) | ORAL | Status: DC | PRN

## 2021-08-30 MED ORDER — LOPERAMIDE HCL 2 MG PO CAPS: 2.0000 mg | ORAL_CAPSULE | ORAL | Status: DC | PRN

## 2021-08-30 MED ORDER — LORAZEPAM 1 MG PO TABS
1.0000 mg | ORAL_TABLET | ORAL | Status: DC | PRN
  Administered 2021-08-31: 1 mg via ORAL
  Administered 2021-08-31: 2 mg via ORAL
  Filled 2021-08-30: qty 1
  Filled 2021-08-30: qty 2

## 2021-08-30 MED ORDER — SODIUM CHLORIDE (PF) 0.9 % IJ SOLN
INTRAMUSCULAR | Status: AC
  Filled 2021-08-30: qty 50

## 2021-08-30 MED ORDER — PROCHLORPERAZINE EDISYLATE 10 MG/2ML IJ SOLN
10.0000 mg | Freq: Four times a day (QID) | INTRAMUSCULAR | Status: DC | PRN
Start: 2021-08-30 — End: 2021-08-31
  Administered 2021-08-30 – 2021-08-31 (×2): 10 mg via INTRAVENOUS
  Filled 2021-08-30 (×2): qty 2

## 2021-08-30 MED ORDER — IOHEXOL 350 MG/ML SOLN
75.0000 mL | Freq: Once | INTRAVENOUS | Status: AC | PRN
  Administered 2021-08-30: 75 mL via INTRAVENOUS

## 2021-08-30 MED ORDER — POTASSIUM CHLORIDE 10 MEQ/100ML IV SOLN
10.0000 meq | INTRAVENOUS | Status: AC
  Administered 2021-08-30 (×6): 10 meq via INTRAVENOUS
  Filled 2021-08-30 (×6): qty 100

## 2021-08-30 MED ORDER — STERILE WATER FOR INJECTION IJ SOLN
INTRAMUSCULAR | Status: AC
  Administered 2021-08-30: 1 mL
  Filled 2021-08-30: qty 10

## 2021-08-30 MED ORDER — POTASSIUM CHLORIDE CRYS ER 20 MEQ PO TBCR
40.0000 meq | EXTENDED_RELEASE_TABLET | Freq: Once | ORAL | Status: AC
  Administered 2021-08-30: 40 meq via ORAL
  Filled 2021-08-30: qty 2

## 2021-08-30 MED ORDER — SODIUM CHLORIDE 0.9 % IV BOLUS
1000.0000 mL | Freq: Once | INTRAVENOUS | Status: AC
  Administered 2021-08-30: 1000 mL via INTRAVENOUS

## 2021-08-30 MED ORDER — ONDANSETRON HCL 4 MG/2ML IJ SOLN
4.0000 mg | Freq: Once | INTRAMUSCULAR | Status: AC
  Administered 2021-08-30: 4 mg via INTRAVENOUS
  Filled 2021-08-30: qty 2

## 2021-08-30 MED ORDER — NAPROXEN 250 MG PO TABS
500.0000 mg | ORAL_TABLET | Freq: Two times a day (BID) | ORAL | Status: DC | PRN
  Administered 2021-08-30: 500 mg via ORAL
  Filled 2021-08-30 (×2): qty 2

## 2021-08-30 NOTE — ED Triage Notes (Addendum)
Pt c/o neck pain, stating they were hanging from bed sheet. Pt also c/o nausea, stating they have been struggling to keep anything down.

## 2021-08-30 NOTE — ED Triage Notes (Signed)
Patient BIB [employer], presenting [facility]. [facility] medical staff requested patient be evaluated due to patient saying she wanted to kill herself. Two [employer] officers at bedside and will stay for duration of visit

## 2021-08-30 NOTE — Plan of Care (Signed)
?  Problem: Clinical Measurements: ?Goal: Will remain free from infection ?Outcome: Progressing ?  ?

## 2021-08-30 NOTE — ED Notes (Signed)
K 2.6, PA-C Christopher called and notified.

## 2021-08-30 NOTE — H&P (Addendum)
History and Physical    Patient: [name] [id number] DOB: 06/03/1985 DOA: 08/30/2021 DOS: the patient was seen and examined on 08/30/2021 PCP: Stevphen Rochester, MD  Patient coming from:  [facility]  Chief Complaint:  Chief Complaint  Patient presents with   Medical Clearance    SI    HPI: [name] is a 36 y.o. female with medical history significant of polysubstance abuse, depression. Presenting after a suicide attempt. She has been incarcerated with Patent examiner. He has been battling depression after several significant family events. This morning, she attempted to hang herself in jail. She was found Programmer, systems and brought into the ED for evaluation. She reports that she uses heroin, fentanyl, benzos, EtOH, and mj. She denies any other aggravating or alleviating factors.     Review of Systems: As mentioned in the history of present illness. All other systems reviewed and are negative. Past Medical History:  Diagnosis Date   Depression    Drug addiction in remission [facility])    H/O gonorrhea    HSV-2 infection    Pregnancy complicated by subutex maintenance, antepartum (HCC)    SVD (spontaneous vaginal delivery) 04/17/2014   Past Surgical History:  Procedure Laterality Date   BREAST BIOPSY Right    BREAST SURGERY     reduction   Social History:  reports that she has been smoking cigarettes. She has a 2.50 pack-year smoking history. She has never used smokeless tobacco. She reports that she does not currently use drugs after having used the following drugs: Heroin, Marijuana, and Fentanyl. She reports that she does not drink alcohol.  Allergies  Allergen Reactions   Penicillins Rash and Other (See Comments)    Causes yeast infection Has patient had a PCN reaction causing immediate rash, facial/tongue/throat swelling, SOB or lightheadedness with hypotension: Yes Has patient had a PCN reaction causing severe rash involving mucus membranes or skin  necrosis: No Has patient had a PCN reaction that required hospitalization: No Has patient had a PCN reaction occurring within the last 10 years: No If all of the above answers are "NO", then may proceed with Cephalosporin use.    Family History  Problem Relation Age of Onset   Breast cancer Mother    Stroke Mother    Breast cancer Maternal Grandmother    Depression Sister     Prior to Admission medications   Medication Sig Start Date End Date Taking? Authorizing Provider  metroNIDAZOLE (FLAGYL) 500 MG tablet Take two tablets by mouth twice a day, for one day.  Or you can take all four tablets at once if you can tolerate it. Patient not taking: Reported on 08/30/2021 07/23/21   Milas Hock, MD  prednisoLONE acetate (PRED FORTE) 1 % ophthalmic suspension Place 1 drop into the left eye 4 (four) times daily. Patient not taking: Reported on 08/30/2021 07/06/21   [provider]  valACYclovir (VALTREX) 500 MG tablet Take 500 mg by mouth as needed (hsv outbreak).    [provider]  ARIPiprazole (ABILIFY) 10 MG tablet Take 1 tablet (10 mg total) by mouth daily. For mood control 09/14/17 04/26/18  Armandina Stammer I, NP  gabapentin (NEURONTIN) 300 MG capsule Take 1 capsule (300 mg total) by mouth 3 (three) times daily. Agitation 09/14/17 04/26/18  Armandina Stammer I, NP  traZODone (DESYREL) 100 MG tablet Take 1 tablet (100 mg total) by mouth at bedtime as needed for sleep. 09/14/17 04/26/18  Sanjuana Kava, NP    Physical Exam: Vitals:  08/30/21 1122 08/30/21 1155 08/30/21 1156 08/30/21 1200  BP: 113/75  (!) 124/102 112/87  Pulse: (!) 52  61 61  Resp: 18  16 16   Temp:  98.1 F (36.7 C)    TempSrc:  Oral    SpO2: 98%  98% 98%  Weight:      Height:       General: 36 y.o. female resting in bed in NAD Eyes: PERRL, normal sclera ENMT: Nares patent w/o discharge, orophaynx clear, dentition normal, ears w/o discharge/lesions/ulcers Neck: Supple, trachea midline, bruising Cardiovascular:  RRR, +S1, S2, no m/g/r, equal pulses throughout Respiratory: CTABL, no w/r/r, normal WOB GI: BS+, NDNT, no masses noted, no organomegaly noted MSK: No e/c/c Neuro: A&O x 3, no focal deficits Psyc: Appropriate interaction but flat affect, calm/cooperative  Data Reviewed:  Lab Results  Component Value Date   NA 139 08/30/2021   K 2.6 (LL) 08/30/2021   CO2 31 08/30/2021   GLUCOSE 112 (H) 08/30/2021   BUN 19 08/30/2021   CREATININE 0.97 08/30/2021   CALCIUM 10.3 08/30/2021   GFRNONAA >60 08/30/2021   Lab Results  Component Value Date   ALT 11 08/30/2021   AST 16 08/30/2021   ALKPHOS 78 08/30/2021   BILITOT 1.2 08/30/2021   Lab Results  Component Value Date   WBC 12.1 (H) 08/30/2021   HGB 16.0 (H) 08/30/2021   HCT 46.1 (H) 08/30/2021   MCV 94.1 08/30/2021   PLT 380 08/30/2021   CTA Neck IMPRESSION: Negative CTA of the neck.  EKG: sinus brady, no st elevations  Assessment and Plan: Suicide attempt Depression     - admit to inpt, tele     - she will need psyc consult after she is medically stabilized     - suicide precautions  Hypokalemia     - replace K+; Mg2+ is ok; check renal function panel at 1800hrs to assess progress  Polysubstance abuse     - EtOH abuse, benzo abuse, opioid abuse, tobacco abuse      - let's get her hydrated; get MVI, folate, thiamine     - start CIWA; will also monitor for opioid withdrawal  N/V Dehydration     - compazine, fluids     - CLD  Leukocytosis     - mild     - no fever     - check UA     - likely stress rxn  Advance Care Planning:   Code Status: FULL  Consults: None  Family Communication: None at bedside  Severity of Illness: The appropriate patient status for this patient is INPATIENT. Inpatient status is judged to be reasonable and necessary in order to provide the required intensity of service to ensure the patient's safety. The patient's presenting symptoms, physical exam findings, and initial radiographic and  laboratory data in the context of their chronic comorbidities is felt to place them at high risk for further clinical deterioration. Furthermore, it is not anticipated that the patient will be medically stable for discharge from the hospital within 2 midnights of admission.   * I certify that at the point of admission it is my clinical judgment that the patient will require inpatient hospital care spanning beyond 2 midnights from the point of admission due to high intensity of service, high risk for further deterioration and high frequency of surveillance required.*  Author: 10/30/2021, DO 08/30/2021 12:50 PM  For on call review [email or web address].

## 2021-08-30 NOTE — Progress Notes (Signed)
       CROSS COVER NOTE  NAME: [name] MRN: [id number] DOB : [birth year]    Date of Service   08/30/21  HPI/Events of Note   Medication request received for report of anxiety and patient not scoring high enough on CIWA scale to give ordered PRN Ativan.  Interventions   Plan: 1 mg PO Ativan     This document was prepared using Dragon voice recognition software and may include unintentional dictation errors.  Bishop Limbo DNP, MHA, FNP-BC Nurse Practitioner [facility] Pager [phone]

## 2021-08-30 NOTE — ED Provider Notes (Signed)
[facility]-EMERGENCY DEPT Provider Note   CSN: [id number] Arrival date & time: 08/30/21  0859     History  Chief Complaint  Patient presents with   Medical Clearance    SI     [name] is a 36 y.o. female with medical history of depression, drug addiction in remission, heroin abuse, HSV-2 infection.  The patient presents to the ED for evaluation of suicidal attempt.  Patient is in custody of [facility], currently incarcerated.  Patient reports he is currently incarcerated for drug possession.  Patient states that she lost her husband in October, lost custody of her child in January.  Patient states she is incarcerated.  Patient states that she is going through withdrawals of heroin, last used heroin on Wednesday and Thursday of this week. Patient reports that she had been sober for 4 years prior to this.  Patient currently complaining of nausea, vomiting, abdominal pain, diarrhea.  Patient denies any fevers, chest pain, shortness of breath, lightheadedness, dizziness, weakness.  Patient reports that she attempted to hang herself utilizing a bedsheet this morning at jail, was found by Corporate treasurer and brought in for evaluation.  Patient also complaining of neck pain.  HPI     Home Medications Prior to Admission medications   Medication Sig Start Date End Date Taking? Authorizing Provider  metroNIDAZOLE (FLAGYL) 500 MG tablet Take two tablets by mouth twice a day, for one day.  Or you can take all four tablets at once if you can tolerate it. Patient not taking: Reported on 08/30/2021 07/23/21   Milas Hock, MD  prednisoLONE acetate (PRED FORTE) 1 % ophthalmic suspension Place 1 drop into the left eye 4 (four) times daily. Patient not taking: Reported on 08/30/2021 07/06/21   [provider]  valACYclovir (VALTREX) 500 MG tablet Take 500 mg by mouth as needed (hsv outbreak).    [provider]  ARIPiprazole (ABILIFY)  10 MG tablet Take 1 tablet (10 mg total) by mouth daily. For mood control 09/14/17 04/26/18  Armandina Stammer I, NP  gabapentin (NEURONTIN) 300 MG capsule Take 1 capsule (300 mg total) by mouth 3 (three) times daily. Agitation 09/14/17 04/26/18  Armandina Stammer I, NP  traZODone (DESYREL) 100 MG tablet Take 1 tablet (100 mg total) by mouth at bedtime as needed for sleep. 09/14/17 04/26/18  Armandina Stammer I, NP      Allergies    Penicillins    Review of Systems   Review of Systems  Respiratory:  Negative for shortness of breath.   Cardiovascular:  Negative for chest pain.  Gastrointestinal:  Positive for abdominal pain, diarrhea, nausea and vomiting.  Musculoskeletal:  Positive for neck pain.  Neurological:  Negative for dizziness, weakness and light-headedness.  All other systems reviewed and are negative.   Physical Exam Updated Vital Signs BP 112/87   Pulse 61   Temp 98.1 F (36.7 C) (Oral)   Resp 16   Ht 5\' 4"  (1.626 m)   Wt 59 kg   SpO2 98%   BMI 22.31 kg/m  Physical Exam Vitals and nursing note reviewed.  Constitutional:      General: She is not in acute distress.    Appearance: Normal appearance. She is not ill-appearing, toxic-appearing or diaphoretic.  HENT:     Head: Normocephalic and atraumatic.     Nose: Nose normal. No congestion.     Mouth/Throat:     Mouth: Mucous membranes are dry.     Pharynx: Oropharynx  is clear.  Eyes:     Extraocular Movements: Extraocular movements intact.     Conjunctiva/sclera: Conjunctivae normal.     Pupils: Pupils are equal, round, and reactive to light.  Cardiovascular:     Rate and Rhythm: Normal rate and regular rhythm.  Pulmonary:     Effort: Pulmonary effort is normal.     Breath sounds: Normal breath sounds.  Abdominal:     General: Abdomen is flat. Bowel sounds are normal.     Tenderness: There is generalized abdominal tenderness.  Musculoskeletal:     Cervical back: Tenderness present.  Skin:    General: Skin is warm and dry.      Capillary Refill: Capillary refill takes less than 2 seconds.  Neurological:     General: No focal deficit present.     Mental Status: She is alert and oriented to person, place, and time.     GCS: GCS eye subscore is 4. GCS verbal subscore is 5. GCS motor subscore is 6.     Cranial Nerves: Cranial nerves 2-12 are intact. No cranial nerve deficit.     Sensory: Sensation is intact. No sensory deficit.     Motor: Motor function is intact. No weakness.     Coordination: Coordination is intact. Heel to [facility] Test normal.  Psychiatric:        Mood and Affect: Mood is depressed. Affect is blunt and flat.     ED Results / Procedures / Treatments   Labs (all labs ordered are listed, but only abnormal results are displayed) Labs Reviewed  COMPREHENSIVE METABOLIC PANEL - Abnormal; Notable for the following components:      Result Value   Potassium 2.6 (*)    Chloride 91 (*)    Glucose, Bld 112 (*)    Total Protein 10.7 (*)    Albumin 5.9 (*)    Anion gap 17 (*)    All other components within normal limits  SALICYLATE LEVEL - Abnormal; Notable for the following components:   Salicylate Lvl [id number] (*)    All other components within normal limits  ACETAMINOPHEN LEVEL - Abnormal; Notable for the following components:   Acetaminophen (Tylenol), Serum <10 (*)    All other components within normal limits  CBC - Abnormal; Notable for the following components:   WBC 12.1 (*)    Hemoglobin 16.0 (*)    HCT 46.1 (*)    All other components within normal limits  ETHANOL  HCG, QUANTITATIVE, PREGNANCY  MAGNESIUM  RAPID URINE DRUG SCREEN, HOSP PERFORMED  PHOSPHORUS  URINALYSIS, ROUTINE W REFLEX MICROSCOPIC    EKG None  Radiology CT Angio Neck W and/or Wo Contrast  Result Date: 08/30/2021 CLINICAL DATA:  Neck trauma with arterial injury suspected EXAM: CT ANGIOGRAPHY NECK TECHNIQUE: Multidetector CT imaging of the neck was performed using the standard protocol during bolus administration of  intravenous contrast. Multiplanar CT image reconstructions and MIPs were obtained to evaluate the vascular anatomy. Carotid stenosis measurements (when applicable) are obtained utilizing NASCET criteria, using the distal internal carotid diameter as the denominator. RADIATION DOSE REDUCTION: This exam was performed according to the departmental dose-optimization program which includes automated exposure control, adjustment of the mA and/or kV according to patient size and/or use of iterative reconstruction technique. CONTRAST:  50mL OMNIPAQUE IOHEXOL 350 MG/ML SOLN COMPARISON:  None Available. FINDINGS: Aortic arch: Normal Right carotid system: Vessels are smooth and widely patent. Left carotid system: Vessels are smooth and widely patent Vertebral arteries: No subclavian or vertebral  stenosis or irregularity. Skeleton: Remote blowout fracture of the right orbital floor. No acute finding Other neck: No visible hematoma or laryngeal injury. Upper chest: Negative IMPRESSION: Negative CTA of the neck. Electronically Signed   By: Jorje Guild M.D.   On: 08/30/2021 12:32    Procedures Procedures   Medications Ordered in ED Medications  sodium chloride (PF) 0.9 % injection (has no administration in time range)  potassium chloride 10 mEq in 100 mL IVPB (10 mEq Intravenous New Bag/Given 08/30/21 1352)  LORazepam (ATIVAN) tablet 1-4 mg (has no administration in time range)    Or  LORazepam (ATIVAN) injection 1-4 mg (has no administration in time range)  thiamine (VITAMIN B1) tablet 100 mg (has no administration in time range)    Or  thiamine (VITAMIN B1) injection 100 mg (has no administration in time range)  folic acid (FOLVITE) tablet 1 mg (has no administration in time range)  multivitamin with minerals tablet 1 tablet (has no administration in time range)  0.9 %  sodium chloride infusion (has no administration in time range)  prochlorperazine (COMPAZINE) injection 10 mg (has no administration in time  range)  nicotine (NICODERM CQ - dosed in mg/24 hours) patch 14 mg (has no administration in time range)  dicyclomine (BENTYL) tablet 20 mg (has no administration in time range)  loperamide (IMODIUM) capsule 2-4 mg (has no administration in time range)  methocarbamol (ROBAXIN) tablet 500 mg (has no administration in time range)  naproxen (NAPROSYN) tablet 500 mg (has no administration in time range)  sodium chloride 0.9 % bolus 1,000 mL (1,000 mLs Intravenous New Bag/Given 08/30/21 1001)  ondansetron (ZOFRAN) injection 4 mg (4 mg Intravenous Given 08/30/21 0958)  potassium chloride SA (KLOR-CON M) CR tablet 40 mEq (40 mEq Oral Given 08/30/21 1130)  iohexol (OMNIPAQUE) 350 MG/ML injection 75 mL (75 mLs Intravenous Contrast Given 08/30/21 1213)  LORazepam (ATIVAN) injection 0.5 mg (0.5 mg Intravenous Given 08/30/21 1134)  sterile water (preservative free) injection (1 mL  Given 08/30/21 1142)  prochlorperazine (COMPAZINE) injection 10 mg (10 mg Intravenous Given 08/30/21 1202)    ED Course/ Medical Decision Making/ A&P                           Medical Decision Making Amount and/or Complexity of Data Reviewed Labs: ordered. Radiology: ordered.  Risk Prescription drug management.   36 year old female presents to ED for evaluation.  Please see HPI for further details.  On examination, patient afebrile, nontachycardic.  Patient lung sounds clear bilaterally, she is not hypoxic on room air.  Patient abdomen soft and compressible however the patient does endorse generalized abdominal tenderness.  The patient neck has no overlying skin change, no bruising or ecchymosis.  The patient does have centralized C-spine tenderness that is reproducible on palpation.  Patient worked up utilizing the following labs and imaging studies interpreted by me personally: - Magnesium unremarkable at 2.1 - Acetaminophen less than 10 - Salicylate level less than 7 - CBC with slight leukocytosis of 12.1 however the patient is  afebrile and nontachycardic - CMP with decreased potassium to 2.6, critical lab value.  The patient was given 40 mEq of oral potassium which she vomited back up.  The patient was then started on IV potassium at 10 mg.  The patient will be given 6 rounds of IV potassium 10 mg.  The patient will be admitted for her hypokalemia - Ethanol less than 10 - Beta-hCG negative - CTA  neck shows no acute abnormality  Patient provided with a liter normal saline, 4 mg Zofran.  Patient continued to have active emesis after Zofran.  Compazine was given at this time.  Patient also treated with 0.5 mg Ativan for anxiety.  Due to low potassium, hospitalist Dr. Ronaldo Miyamoto was consulted.  Dr. Ronaldo Miyamoto agreed to admit the patient for potassium repletion.  Patient agreeable to plan.  At this time, the patient meets inpatient criteria for hypokalemia.  The patient will be admitted for IV potassium.  Final Clinical Impression(s) / ED Diagnoses Final diagnoses:  Hypokalemia  Suicide attempt [facility])    Rx / DC Orders ED Discharge Orders     None         Clent Ridges 08/30/21 1418    Gloris Manchester, MD 08/31/21 1843

## 2021-08-30 NOTE — ED Notes (Signed)
Pt. Had emesis episode after potassium and water was given. PA notified and aware with no new orders

## 2021-08-31 DIAGNOSIS — X838XXA Intentional self-harm by other specified means, initial encounter: Secondary | ICD-10-CM | POA: Diagnosis present

## 2021-08-31 DIAGNOSIS — E876 Hypokalemia: Secondary | ICD-10-CM | POA: Diagnosis not present

## 2021-08-31 LAB — COMPREHENSIVE METABOLIC PANEL
ALT: 9 U/L (ref 0–44)
AST: 12 U/L — ABNORMAL LOW (ref 15–41)
Albumin: 3.9 g/dL (ref 3.5–5.0)
Alkaline Phosphatase: 52 U/L (ref 38–126)
Anion gap: 8 (ref 5–15)
BUN: 11 mg/dL (ref 6–20)
CO2: 23 mmol/L (ref 22–32)
Calcium: 8.9 mg/dL (ref 8.9–10.3)
Chloride: 109 mmol/L (ref 98–111)
Creatinine, Ser: 0.78 mg/dL (ref 0.44–1.00)
GFR, Estimated: >60 mL/min (ref >60)
Glucose, Bld: 101 mg/dL — ABNORMAL HIGH (ref 70–99)
Potassium: 3.4 mmol/L — ABNORMAL LOW (ref 3.5–5.1)
Sodium: 140 mmol/L (ref 135–145)
Total Bilirubin: 1 mg/dL (ref 0.3–1.2)
Total Protein: 7.2 g/dL (ref 6.5–8.1)

## 2021-08-31 LAB — CBC
HCT: 38.5 % (ref 36.0–46.0)
Hemoglobin: 12.8 g/dL (ref 12.0–15.0)
MCH: 32.5 pg (ref 26.0–34.0)
MCHC: 33.2 g/dL (ref 30.0–36.0)
MCV: 97.7 fL (ref 80.0–100.0)
Platelets: 253 10*3/uL (ref 150–400)
RBC: 3.94 MIL/uL (ref 3.87–5.11)
RDW: 13.2 % (ref 11.5–15.5)
WBC: 12.8 10*3/uL — ABNORMAL HIGH (ref 4.0–10.5)
nRBC: 0 % (ref 0.0–0.2)

## 2021-08-31 MED ORDER — ESCITALOPRAM OXALATE 10 MG PO TABS
5.0000 mg | ORAL_TABLET | Freq: Once | ORAL | Status: AC
  Administered 2021-08-31: 5 mg via ORAL
  Filled 2021-08-31: qty 1

## 2021-08-31 MED ORDER — ARIPIPRAZOLE 5 MG PO TABS: 5.0000 mg | ORAL_TABLET | Freq: Every day | ORAL | 0 refills | Status: DC

## 2021-08-31 MED ORDER — ARIPIPRAZOLE 2 MG PO TABS
2.0000 mg | ORAL_TABLET | Freq: Once | ORAL | Status: DC
Start: 2021-08-31 — End: 2021-08-31
  Filled 2021-08-31: qty 1

## 2021-08-31 MED ORDER — BUSPIRONE HCL 10 MG PO TABS
10.0000 mg | ORAL_TABLET | Freq: Three times a day (TID) | ORAL | Status: DC
Start: 2021-08-31 — End: 2021-08-31
  Administered 2021-08-31: 10 mg via ORAL
  Filled 2021-08-31: qty 1

## 2021-08-31 MED ORDER — ARIPIPRAZOLE 5 MG PO TABS: 5.0000 mg | ORAL_TABLET | Freq: Every day | ORAL | Status: DC

## 2021-08-31 MED ORDER — ESCITALOPRAM OXALATE 10 MG PO TABS: 10.0000 mg | ORAL_TABLET | Freq: Every day | ORAL | Status: DC

## 2021-08-31 NOTE — Progress Notes (Signed)
The nurse called into patient's room.  Top half of iv pole noted to be leaning over patient with tech hanging on to the top half of pole.  Sheriff's deputies in room.  Patient stated that the top of the iv pole fell and hit her on the forehead while moving head of bed.  Slight redness noted to site.  MD made and primary nurse made aware.    Levora Angel, RN

## 2021-08-31 NOTE — Progress Notes (Deleted)
PROGRESS NOTE  [name] SJG:[id number] DOB: 1985-11-03 DOA: 08/30/2021 PCP: Stevphen Rochester, MD   LOS: 1 day   Brief Narrative / Interim history: 36 year old female with polysubstance abuse, depression comes in the hospital after a suicide attempt.  Apparently she has been incarcerated in jail for the past 4 to 5 days, she has been battling depression after several significant family events and attempted to hang herself in jail on 8/6.  She was brought to the hospital.  Apparently she is using heroin, fentanyl, benzodiazepines, alcohol as well as marijuana.  Subjective / 24h Interval events: Seen this morning, appears sleepy but wakes up easily and can carry a conversation.  She admits to trying to hurt herself due to stressors in her life.  Denies any chest pain, denies any shortness of breath.  Tells me that she had significant withdrawal symptoms in jail but they are now better  Assesement and Plan: Principal Problem:   Hypokalemia Active Problems:   Depression   Suicide attempt by hanging (HCC)   Polysubstance abuse (HCC)   Nausea and vomiting   Dehydration   Leukocytosis   Principal problem Suicide attempt-patient admitted to the hospital after trying to hang herself in jail.  Psychiatry consulted, appreciate input.  From medical standpoint she does not seem to have anything active right now and would be cleared to discharge to a psychiatric facility if that is what the recommendations are  Active problems Hypokalemia-continue to replete this morning  Polysubstance abuse-UDS only positive for marijuana, but she was in jail for 5 days.  She experienced withdrawals while in jail, but currently does not appear to have significant symptoms   Nausea, vomiting-resolved.  Advance diet  Leukocytosis- mild, no fever, likely stress reaction   Scheduled Meds:  enoxaparin (LOVENOX) injection  40 mg Subcutaneous Q24H   folic acid  1 mg Oral Daily   multivitamin with  minerals  1 tablet Oral Daily   thiamine  100 mg Oral Daily   Or   thiamine  100 mg Intravenous Daily   Continuous Infusions:  sodium chloride 100 mL/hr at 08/31/21 0229   PRN Meds:.dicyclomine, loperamide, LORazepam **OR** LORazepam, methocarbamol, naproxen, nicotine, prochlorperazine  Diet Orders (From admission, onward)     Start     Ordered   08/30/21 1339  Diet clear liquid Room service appropriate? Yes; Fluid consistency: Thin  Diet effective now       Question Answer Comment  Room service appropriate? Yes   Fluid consistency: Thin      08/30/21 1338            DVT prophylaxis: enoxaparin (LOVENOX) injection 40 mg Start: 08/30/21 2200   Lab Results  Component Value Date   PLT 253 08/31/2021      Code Status: Full Code  Family Communication: No family at bedside  Status is: Inpatient  Remains inpatient appropriate because: Needs psychiatric evaluation and likely inpatient psych  Level of care: Telemetry  Consultants:  Psychiatry   Objective: Vitals:   08/30/21 1200 08/30/21 1923 08/31/21 0230 08/31/21 0528  BP: 112/87 138/69 131/78 123/65  Pulse: 61 (!) 54 (!) 48 (!) 59  Resp: 16   16  Temp:  99.5 F (37.5 C) 99.2 F (37.3 C) 97.9 F (36.6 C)  TempSrc:  Oral Oral Oral  SpO2: 98% 97% 97% 98%  Weight:      Height:        Intake/Output Summary (Last 24 hours) at 08/31/2021 1003 Last data filed at  08/31/2021 0229 Gross per 24 hour  Intake 1891.72 ml  Output --  Net 1891.72 ml   Wt Readings from Last 3 Encounters:  08/30/21 59 kg  07/21/21 61.3 kg  04/01/20 57.6 kg    Examination:  Constitutional: NAD Eyes: no scleral icterus ENMT: Mucous membranes are moist.  Neck: normal, supple Respiratory: clear to auscultation bilaterally, no wheezing, no crackles.  Cardiovascular: Regular rate and rhythm, no murmurs / rubs / gallops.  Abdomen: non distended, no tenderness. Bowel sounds positive.  Musculoskeletal: no clubbing / cyanosis.  Skin: no  rashes Neurologic: non focal   Data Reviewed: I have independently reviewed following labs and imaging studies   CBC Recent Labs  Lab 08/30/21 0910 08/31/21 0434  WBC 12.1* 12.8*  HGB 16.0* 12.8  HCT 46.1* 38.5  PLT 380 253  MCV 94.1 97.7  MCH 32.7 32.5  MCHC 34.7 33.2  RDW 13.2 13.2    Recent Labs  Lab 08/30/21 0910 08/30/21 1056 08/30/21 1836 08/31/21 0434  NA 139  --  142 140  K 2.6*  --  3.5 3.4*  CL 91*  --  105 109  CO2 31  --  25 23  GLUCOSE 112*  --  102* 101*  BUN 19  --  12 11  CREATININE 0.97  --  0.75 0.78  CALCIUM 10.3  --  9.1 8.9  AST 16  --   --  12*  ALT 11  --   --  9  ALKPHOS 78  --   --  52  BILITOT 1.2  --   --  1.0  ALBUMIN 5.9*  --  4.5 3.9  MG  --  2.1  --   --     ------------------------------------------------------------------------------------------------------------------ No results for input(s): "CHOL", "HDL", "LDLCALC", "TRIG", "CHOLHDL", "LDLDIRECT" in the last 72 hours.  No results found for: "HGBA1C" ------------------------------------------------------------------------------------------------------------------ No results for input(s): "TSH", "T4TOTAL", "T3FREE", "THYROIDAB" in the last 72 hours.  Invalid input(s): "FREET3"  Cardiac Enzymes No results for input(s): "CKMB", "TROPONINI", "MYOGLOBIN" in the last 168 hours.  Invalid input(s): "CK" ------------------------------------------------------------------------------------------------------------------ No results found for: "BNP"  CBG: No results for input(s): "GLUCAP" in the last 168 hours.  No results found for this or any previous visit (from the past 240 hour(s)).   Radiology Studies: CT Angio Neck W and/or Wo Contrast  Result Date: 08/30/2021 CLINICAL DATA:  Neck trauma with arterial injury suspected EXAM: CT ANGIOGRAPHY NECK TECHNIQUE: Multidetector CT imaging of the neck was performed using the standard protocol during bolus administration of intravenous  contrast. Multiplanar CT image reconstructions and MIPs were obtained to evaluate the vascular anatomy. Carotid stenosis measurements (when applicable) are obtained utilizing NASCET criteria, using the distal internal carotid diameter as the denominator. RADIATION DOSE REDUCTION: This exam was performed according to the departmental dose-optimization program which includes automated exposure control, adjustment of the mA and/or kV according to patient size and/or use of iterative reconstruction technique. CONTRAST:  63mL OMNIPAQUE IOHEXOL 350 MG/ML SOLN COMPARISON:  None Available. FINDINGS: Aortic arch: Normal Right carotid system: Vessels are smooth and widely patent. Left carotid system: Vessels are smooth and widely patent Vertebral arteries: No subclavian or vertebral stenosis or irregularity. Skeleton: Remote blowout fracture of the right orbital floor. No acute finding Other neck: No visible hematoma or laryngeal injury. Upper chest: Negative IMPRESSION: Negative CTA of the neck. Electronically Signed   By: Tiburcio Pea M.D.   On: 08/30/2021 12:32     Pamella Pert, MD, PhD [facility]  Between 7 am - 7 pm I am available, please contact me via Amion (for emergencies) or Securechat (non urgent messages)  Between 7 pm - 7 am I am not available, please contact night coverage MD/APP via Amion

## 2021-08-31 NOTE — Progress Notes (Signed)
24 hour chart audit completed 

## 2021-08-31 NOTE — Discharge Summary (Signed)
Physician Discharge Summary  [name] OEV:[id number] DOB: 1986-01-08 DOA: 08/30/2021  PCP: Stevphen Rochester, MD  Admit date: 08/30/2021 Discharge date: 08/31/2021  Admitted From: jail Disposition:  jail  Recommendations for Outpatient Follow-up:  Follow up with MD in 1-2 weeks  Home Health: none Equipment/Devices: none  Discharge Condition: stable CODE STATUS: Full code Diet Orders (From admission, onward)     Start     Ordered   08/31/21 1015  Diet regular Room service appropriate? Yes; Fluid consistency: Thin  Diet effective now       Question Answer Comment  Room service appropriate? Yes   Fluid consistency: Thin      08/31/21 1014            HPI: Per admitting MD, [name] is a 36 y.o. female with medical history significant of polysubstance abuse, depression. Presenting after a suicide attempt. She has been incarcerated with Patent examiner. He has been battling depression after several significant family events. This morning, she attempted to hang herself in jail. She was found [facility] systems and brought into the ED for evaluation. She reports that she uses heroin, fentanyl, benzos, EtOH, and mj. She denies any other aggravating or alleviating factors.     Hospital Course / Discharge diagnoses: Principal Problem:   Hypokalemia Active Problems:   Depression   Suicide attempt by hanging (HCC)   Polysubstance abuse (HCC)   Nausea and vomiting   Dehydration   Leukocytosis   Principal problem Suicide attempt-patient admitted to the hospital after trying to hang herself in jail.  Psychiatry consulted, I have discussed with the psychiatry team, she will need inpatient psych however that is not possible given the fact that she will need to have armed guards with her.  She was cleared to discharge from acute care hospital back to jail, and apparently she will be moved to prison where they will be continuing her psychiatric care as well as  suicide precautions.   Active problems Hypokalemia-repleted prior to discharge.  Please check routine labs in a week Polysubstance abuse-UDS only positive for marijuana, but she was in jail for 5 days.  She experienced withdrawals while in jail, but currently does not appear to have significant symptoms  Nausea, vomiting-resolved.  Tolerating a regular diet Leukocytosis- mild, no fever, likely stress reaction  Sepsis ruled out   Discharge Instructions   Allergies as of 08/31/2021       Reactions   Penicillins Rash, Other (See Comments)   Causes yeast infection Has patient had a PCN reaction causing immediate rash, facial/tongue/throat swelling, SOB or lightheadedness with hypotension: Yes Has patient had a PCN reaction causing severe rash involving mucus membranes or skin necrosis: No Has patient had a PCN reaction that required hospitalization: No Has patient had a PCN reaction occurring within the last 10 years: No If all of the above answers are "NO", then may proceed with Cephalosporin use.        Medication List     STOP taking these medications    metroNIDAZOLE 500 MG tablet Commonly known as: FLAGYL   prednisoLONE acetate 1 % ophthalmic suspension Commonly known as: PRED FORTE       TAKE these medications    ARIPiprazole 5 MG tablet Commonly known as: ABILIFY Take 1 tablet (5 mg total) by mouth daily. 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  Consultations: Psychiatry   Procedures/Studies:  CT Angio Neck W and/or Wo Contrast  Result Date: 08/30/2021 CLINICAL DATA:  Neck trauma with arterial injury suspected EXAM: CT ANGIOGRAPHY NECK TECHNIQUE: Multidetector CT imaging of the neck was performed using the standard protocol during bolus administration of intravenous contrast. Multiplanar CT image reconstructions and MIPs were obtained to evaluate the  vascular anatomy. Carotid stenosis measurements (when applicable) are obtained utilizing NASCET criteria, using the distal internal carotid diameter as the denominator. RADIATION DOSE REDUCTION: This exam was performed according to the departmental dose-optimization program which includes automated exposure control, adjustment of the mA and/or kV according to patient size and/or use of iterative reconstruction technique. CONTRAST:  63mL OMNIPAQUE IOHEXOL 350 MG/ML SOLN COMPARISON:  None Available. FINDINGS: Aortic arch: Normal Right carotid system: Vessels are smooth and widely patent. Left carotid system: Vessels are smooth and widely patent Vertebral arteries: No subclavian or vertebral stenosis or irregularity. Skeleton: Remote blowout fracture of the right orbital floor. No acute finding Other neck: No visible hematoma or laryngeal injury. Upper chest: Negative IMPRESSION: Negative CTA of the neck. Electronically Signed   By: Tiburcio Pea M.D.   On: 08/30/2021 12:32     Subjective: - no chest pain, shortness of breath, no abdominal pain, nausea or vomiting.   Discharge Exam: BP 130/87 (BP Location: Right Arm)   Pulse (!) 51   Temp 98.7 F (37.1 C) (Oral)   Resp 18   Ht 5\' 4"  (1.626 m)   Wt 59 kg   SpO2 99%   BMI 22.31 kg/m   General: Pt is alert, awake, not in acute distress Cardiovascular: RRR, S1/S2 +, no rubs, no gallops Respiratory: CTA bilaterally, no wheezing, no rhonchi Abdominal: Soft, NT, ND, bowel sounds + Extremities: no edema, no cyanosis    The results of significant diagnostics from this hospitalization (including imaging, microbiology, ancillary and laboratory) are listed below for reference.     Microbiology: No results found for this or any previous visit (from the past 240 hour(s)).   Labs: Basic Metabolic Panel: Recent Labs  Lab 08/30/21 0910 08/30/21 1056 08/30/21 1103 08/30/21 1836 08/31/21 0434  NA 139  --   --  142 140  K 2.6*  --   --  3.5 3.4*   CL 91*  --   --  105 109  CO2 31  --   --  25 23  GLUCOSE 112*  --   --  102* 101*  BUN 19  --   --  12 11  CREATININE 0.97  --   --  0.75 0.78  CALCIUM 10.3  --   --  9.1 8.9  MG  --  2.1  --   --   --   PHOS  --   --  3.9 2.7  --    Liver Function Tests: Recent Labs  Lab 08/30/21 0910 08/30/21 1836 08/31/21 0434  AST 16  --  12*  ALT 11  --  9  ALKPHOS 78  --  52  BILITOT 1.2  --  1.0  PROT 10.7*  --  7.2  ALBUMIN 5.9* 4.5 3.9   CBC: Recent Labs  Lab 08/30/21 0910 08/31/21 0434  WBC 12.1* 12.8*  HGB 16.0* 12.8  HCT 46.1* 38.5  MCV 94.1 97.7  PLT 380 253   CBG: No results for input(s): "GLUCAP" in the last 168 hours. Hgb A1c No results for input(s): "HGBA1C" in the last 72 hours. Lipid Profile No results for  input(s): "CHOL", "HDL", "LDLCALC", "TRIG", "CHOLHDL", "LDLDIRECT" in the last 72 hours. Thyroid function studies No results for input(s): "TSH", "T4TOTAL", "T3FREE", "THYROIDAB" in the last 72 hours.  Invalid input(s): "FREET3" Urinalysis    Component Value Date/Time   COLORURINE YELLOW 08/30/2021 1346   APPEARANCEUR HAZY (A) 08/30/2021 1346   LABSPEC 1.028 08/30/2021 1346   PHURINE 7.0 08/30/2021 1346   GLUCOSEU NEGATIVE 08/30/2021 1346   HGBUR MODERATE (A) 08/30/2021 1346   BILIRUBINUR NEGATIVE 08/30/2021 1346   KETONESUR 80 (A) 08/30/2021 1346   PROTEINUR 30 (A) 08/30/2021 1346   NITRITE NEGATIVE 08/30/2021 1346   LEUKOCYTESUR TRACE (A) 08/30/2021 1346    FURTHER DISCHARGE INSTRUCTIONS:   Get Medicines reviewed and adjusted: Please take all your medications with you for your next visit with your Primary MD   Laboratory/radiological data: Please request your Primary MD to go over all hospital tests and procedure/radiological results at the follow up, please ask your Primary MD to get all Hospital records sent to his/her office.   In some cases, they will be blood work, cultures and biopsy results pending at the time of your discharge.  Please request that your primary care M.D. goes through all the records of your hospital data and follows up on these results.   Also Note the following: If you experience worsening of your admission symptoms, develop shortness of breath, life threatening emergency, suicidal or homicidal thoughts you must seek medical attention immediately by calling 911 or calling your MD immediately  if symptoms less severe.   You must read complete instructions/literature along with all the possible adverse reactions/side effects for all the Medicines you take and that have been prescribed to you. Take any new Medicines after you have completely understood and accpet all the possible adverse reactions/side effects.    Do not drive when taking Pain medications or sleeping medications (Benzodaizepines)   Do not take more than prescribed Pain, Sleep and Anxiety Medications. It is not advisable to combine anxiety,sleep and pain medications without talking with your primary care practitioner   Special Instructions: If you have smoked or chewed Tobacco  in the last 2 yrs please stop smoking, stop any regular Alcohol  and or any Recreational drug use.   Wear Seat belts while driving.   Please note: You were cared for by a hospitalist during your hospital stay. Once you are discharged, your primary care physician will handle any further medical issues. Please note that NO REFILLS for any discharge medications will be authorized once you are discharged, as it is imperative that you return to your primary care physician (or establish a relationship with a primary care physician if you do not have one) for your post hospital discharge needs so that they can reassess your need for medications and monitor your lab values.  Time coordinating discharge: 35 minutes  SIGNED:  Pamella Pert, MD, PhD 08/31/2021, 4:00 PM

## 2021-08-31 NOTE — Discharge Instructions (Signed)
Plan -Will resume previous medications to include Lexapro 5 mg p.o. and a single dose, plan to increase Lexapro to 10 mg p.o. daily on tomorrow August 8.  Patient also consented to resume BuSpar 10 mg p.o. 3 times daily for anxiety.  Patient is interested in initiation of Abilify for adjunct of therapy, worsening depression, and ongoing suicidal thoughts.  Recommend starting Abilify 2 mg p.o. x 2 days, plan to increase goal of Abilify 5 mg p.o. daily for maintenance.  May titrate for symptom management.  Please closely monitor for EPS and metabolic syndrome.  Patient will need to remain on suicide precautions, until felt safe by correctional staff that she is able to return to general population.

## 2021-08-31 NOTE — Progress Notes (Signed)
Pt got up to the bathroom to void. C/o of nausea, pain, and anxiety. Will administer CIWA and COWS per protocol. Will continue to monitor patient.

## 2021-08-31 NOTE — Progress Notes (Signed)
Offered to let patient get up to go to the bathroom. Pt declined. Will continue to monitor.

## 2021-08-31 NOTE — Consult Note (Signed)
[facility] Face-to-Face Psychiatry Consult   Reason for Consult: Suicide attempt Referring Physician: Dr. Elvera Lennox Patient Identification: [name] MRN:  [id number] Principal Diagnosis: Hypokalemia Diagnosis:  Principal Problem:   Hypokalemia Active Problems:   Depression   Suicide attempt by hanging (HCC)   Polysubstance abuse (HCC)   Nausea and vomiting   Dehydration   Leukocytosis   Suicide (HCC)   Total Time spent with patient: 1 hour  Subjective:   [name] is a 36 y.o. female patient admitted with suicide attempt by hanging.   Psychiatry was consulted on 08/07 for suicide attempt .  Patient reports recent suicide attempt by hanging x 2 while in jail cell.  She identifies multiple stressors and factors leading up to her attempt which include relapse on substances, recent sentencing, lost custody of children, and death of her husband.  She endorses depressive symptoms that are consistent with despondency, anhedonia, guilty, worthlessness, hopelessness, suicidal attempt, and worsening anxiety.  She denies any current symptoms of mania or psychosis.  She reports a previous psychiatric history of anxiety, ADHD, and PTSD.  She reports ineffective trials of multiple medications, none of which have been effective with the exception of Klonopin.  She is currently not enrolled in or receiving any therapy for the above aforementioned diagnoses.  She does endorse significant history of substance use, recently relapsed on July 3 after her children were removed from her custody.  She reports being clean from March 2023 to July.  She does report longer periods of sobriety, however due to recent stressors she relapsed.   On evaluation patient is alert and oriented, calm and cooperative, tearful yet pleasant and willing to engage.  She does have a suicide sitter at the bedside, in addition to 2 long Ambulance person.  Patient does show willingness to participate in her treatment plan,  although tearful throughout she is able to verbalize and express her current thoughts and stressors leading up to her suicide attempt.  She denies any other history of suicidal attempts, suicidal thoughts, suicidal ideations, and or history of self-harm behavior.  She is open to resuming her psychotropic medication, and appears motivated to get better.  At the time of this evaluation she denies any current fleeting suicidal ideations, active suicidal ideation, and or passive suicidal ideations.  She did initially answer the question with I do not know, however followed up with no to current thoughts to end her life.  She further denies any homicidal ideations and or auditory or visual hallucinations.  She answers all questions appropriately, has linear thought processes, and engages well with this provider.  We did review in depth current treatment options to include resuming home medication, inpatient psychiatric services, and returning back to jail with suicide precautions in place.  We mutually agreed to resume home medications and discharge to jail.  She is agreeable to follow-up with correctional providers and qualify professionals.   Husband passed away 2020-12-07; suspect overdose cause of death undetermined.   HPI:  [name] is a 36 y.o. female with medical history significant of polysubstance abuse, depression. Presenting after a suicide attempt. She has been incarcerated with Patent examiner. He has been battling depression after several significant family events. This morning, she attempted to hang herself in jail. She was found Programmer, systems and brought into the ED for evaluation. She reports that she uses heroin, fentanyl, benzos, EtOH, and mj. She denies any other aggravating or alleviating factors.     Past Psychiatric History: Anxiety, ADD, PTSD. NO  history of suicide attempt.   Past psych meds: Klonopin, ativan, buspar (not effective), celexa, prazosin, Zoloft, wellbutrin,  lexapro.  Substance use: Relapsed after son was removed from custody Aug 18, 2022) after childs father died. Alcohol, heroin, fentanyl, marijuana. Completed the program at [facility] in 03/2021 (feels like she did well).   Legal: Original charged with abduction. recharge with abduction and kidnapping. Children were awarded in Dec to her custody.  Patient was recently sentence to present for the above aforementioned charges.   Risk to Self: Denies at present; however initially presented with suicide attempt by hanging Risk to Others: Denies Prior Inpatient Therapy: Denies; [facility] Prior Outpatient Therapy: Denies  Past Medical History:  Past Medical History:  Diagnosis Date   Depression    Drug addiction in remission [facility])    H/O gonorrhea    HSV-2 infection    Pregnancy complicated by subutex maintenance, antepartum (HCC)    SVD (spontaneous vaginal delivery) 04/17/2014    Past Surgical History:  Procedure Laterality Date   BREAST BIOPSY Right    BREAST SURGERY     reduction   Family History:  Family History  Problem Relation Age of Onset   Breast cancer Mother    Stroke Mother    Breast cancer Maternal Grandmother    Depression Sister    Family Psychiatric  History: NO family history of mental illness. Sister with history of multiple suicide attempts, unsuccessful.  Social History:  Social History   Substance and Sexual Activity  Alcohol Use No     Social History   Substance and Sexual Activity  Drug Use Not Currently   Types: Heroin, Marijuana, Fentanyl   Comment: Currently on Subutex    Social History   Socioeconomic History   Marital status: Significant Other    Spouse name: Not on file   Number of children: Not on file   Years of education: Not on file   Highest education level: Not on file  Occupational History   Not on file  Tobacco Use   Smoking status: Every Day    Packs/day: 0.25    Years: 10.00    Total pack  years: 2.50    Types: Cigarettes   Smokeless tobacco: Never  Vaping Use   Vaping Use: Never used  Substance and Sexual Activity   Alcohol use: No   Drug use: Not Currently    Types: Heroin, Marijuana, Fentanyl    Comment: Currently on Subutex   Sexual activity: Yes    Partners: Male    Birth control/protection: None  Other Topics Concern   Not on file  Social History Narrative   Not on file   Social Determinants of Health   Financial Resource Strain: High Risk (09/12/2017)   Overall Financial Resource Strain (CARDIA)    Difficulty of Paying Living Expenses: Very hard  Food Insecurity: No Food Insecurity (09/12/2017)   Hunger Vital Sign    Worried About Running Out of Food in the Last Year: Never true    Ran Out of Food in the Last Year: Never true  Transportation Needs: No Transportation Needs (09/12/2017)   PRAPARE - Administrator, Civil Service (Medical): No    Lack of Transportation (Non-Medical): No  Physical Activity: Inactive (09/12/2017)   Exercise Vital Sign    Days of Exercise per Week: 0 days    Minutes of Exercise per Session: 0 min  Stress: Stress Concern Present (09/12/2017)   [employer] of Occupational  Health - Occupational Stress Questionnaire    Feeling of Stress : Very much  Social Connections: Not on file   Additional Social History:    Allergies:   Allergies  Allergen Reactions   Penicillins Rash and Other (See Comments)    Causes yeast infection Has patient had a PCN reaction causing immediate rash, facial/tongue/throat swelling, SOB or lightheadedness with hypotension: Yes Has patient had a PCN reaction causing severe rash involving mucus membranes or skin necrosis: No Has patient had a PCN reaction that required hospitalization: No Has patient had a PCN reaction occurring within the last 10 years: No If all of the above answers are "NO", then may proceed with Cephalosporin use.    Labs:  Results for orders placed or performed  during the hospital encounter of 08/30/21 (from the past 48 hour(s))  Comprehensive metabolic panel     Status: Abnormal   Collection Time: 08/30/21  9:10 AM  Result Value Ref Range   Sodium 139 135 - 145 mmol/L   Potassium 2.6 (LL) 3.5 - 5.1 mmol/L    Comment: CRITICAL RESULT CALLED TO, READ BACK BY AND VERIFIED WITH GARRISON,G. RN AT 1053 08/30/21 MULLINS,T    Chloride 91 (L) 98 - 111 mmol/L   CO2 31 22 - 32 mmol/L   Glucose, Bld 112 (H) 70 - 99 mg/dL    Comment: Glucose reference range applies only to samples taken after fasting for at least 8 hours.   BUN 19 6 - 20 mg/dL   Creatinine, Ser 1.610.97 0.44 - 1.00 mg/dL   Calcium 09.610.3 8.9 - 04.510.3 mg/dL   Total Protein 40.910.7 (H) 6.5 - 8.1 g/dL   Albumin 5.9 (H) 3.5 - 5.0 g/dL   AST 16 15 - 41 U/L   ALT 11 0 - 44 U/L   Alkaline Phosphatase 78 38 - 126 U/L   Total Bilirubin 1.2 0.3 - 1.2 mg/dL   GFR, Estimated >81>60 >19>60 mL/min    Comment: (NOTE) Calculated using the CKD-EPI Creatinine Equation (2021)    Anion gap 17 (H) 5 - 15    Comment: Performed at [facility], [address] [place], [place] [address]  Ethanol     Status: None   Collection Time: 08/30/21  9:10 AM  Result Value Ref Range   Alcohol, Ethyl (B) <10 <10 mg/dL    Comment: (NOTE) Lowest detectable limit for serum alcohol is 10 mg/dL.  For medical purposes only. Performed at [facility], [address] [place], [place] [address]   Salicylate level     Status: Abnormal   Collection Time: 08/30/21  9:10 AM  Result Value Ref Range   Salicylate Lvl <7.0 (L) 7.0 - 30.0 mg/dL    Comment: Performed at [facility], [address] [place], [place] [address]  Acetaminophen level     Status: Abnormal   Collection Time: 08/30/21  9:10 AM  Result Value Ref Range   Acetaminophen (Tylenol), Serum <10 (L) 10 - 30 ug/mL    Comment: (NOTE) Therapeutic concentrations vary significantly. A range of 10-30 ug/mL  may be an  effective concentration for many patients. However, some  are best treated at concentrations outside of this range. Acetaminophen concentrations >150 ug/mL at 4 hours after ingestion  and >50 ug/mL at 12 hours after ingestion are often associated with  toxic reactions.  Performed at [facility], [address] [place], [place] [address]   cbc     Status:  Abnormal   Collection Time: 08/30/21  9:10 AM  Result Value Ref Range   WBC 12.1 (H) 4.0 - 10.5 K/uL   RBC 4.90 3.87 - 5.11 MIL/uL   Hemoglobin 16.0 (H) 12.0 - 15.0 g/dL   HCT 41.3 (H) 24.4 - 01.0 %   MCV 94.1 80.0 - 100.0 fL   MCH 32.7 26.0 - 34.0 pg   MCHC 34.7 30.0 - 36.0 g/dL   RDW 27.2 53.6 - 64.4 %   Platelets 380 150 - 400 K/uL   nRBC 0.0 0.0 - 0.2 %    Comment: Performed at [facility], [address] [place], [place] [address]  Rapid urine drug screen (hospital performed)     Status: Abnormal   Collection Time: 08/30/21  9:10 AM  Result Value Ref Range   Opiates NONE DETECTED NONE DETECTED   Cocaine NONE DETECTED NONE DETECTED   Benzodiazepines NONE DETECTED NONE DETECTED   Amphetamines NONE DETECTED NONE DETECTED   Tetrahydrocannabinol POSITIVE (A) NONE DETECTED   Barbiturates NONE DETECTED NONE DETECTED    Comment: (NOTE) DRUG SCREEN FOR MEDICAL PURPOSES ONLY.  IF CONFIRMATION IS NEEDED FOR ANY PURPOSE, NOTIFY LAB WITHIN 5 DAYS.  LOWEST DETECTABLE LIMITS FOR URINE DRUG SCREEN Drug Class                     Cutoff (ng/mL) Amphetamine and metabolites    1000 Barbiturate and metabolites    200 Benzodiazepine                 200 Tricyclics and metabolites     300 Opiates and metabolites        300 Cocaine and metabolites        300 THC                            50 Performed at [facility], [address], [place], [place] [address]   hCG, quantitative, pregnancy     Status: None   Collection Time: 08/30/21  9:18 AM  Result Value Ref Range    hCG, Beta Chain, Quant, S 2 <5 mIU/mL    Comment:          GEST. AGE      CONC.  (mIU/mL)   <=1 WEEK        5 - 50     2 WEEKS       50 - 500     3 WEEKS       100 - 10,000     4 WEEKS     1,000 - 30,000     5 WEEKS     3,500 - 115,000   6-8 WEEKS     12,000 - 270,000    12 WEEKS     15,000 - 220,000        FEMALE AND NON-PREGNANT FEMALE:     LESS THAN 5 mIU/mL Performed at [facility], [address], [place], [place] [address]   Magnesium     Status: None   Collection Time: 08/30/21 10:56 AM  Result Value Ref Range   Magnesium 2.1 1.7 - 2.4 mg/dL    Comment: Performed at [facility], [address] [place], [place] [address]  Phosphorus     Status: None   Collection Time: 08/30/21 11:03 AM  Result Value Ref Range   Phosphorus 3.9 2.5 - 4.6 mg/dL  Comment: Performed at [facility], [address] [place], [place] [address]  Urinalysis, Routine w reflex microscopic     Status: Abnormal   Collection Time: 08/30/21  1:46 PM  Result Value Ref Range   Color, Urine YELLOW YELLOW   APPearance HAZY (A) CLEAR   Specific Gravity, Urine 1.028 1.005 - 1.030   pH 7.0 5.0 - 8.0   Glucose, UA NEGATIVE NEGATIVE mg/dL   Hgb urine dipstick MODERATE (A) NEGATIVE   Bilirubin Urine NEGATIVE NEGATIVE   Ketones, ur 80 (A) NEGATIVE mg/dL   Protein, ur 30 (A) NEGATIVE mg/dL   Nitrite NEGATIVE NEGATIVE   Leukocytes,Ua TRACE (A) NEGATIVE   RBC / HPF 0-5 0 - 5 RBC/hpf   WBC, UA 11-20 0 - 5 WBC/hpf   Bacteria, UA NONE SEEN NONE SEEN   Squamous Epithelial / LPF 11-20 0 - 5   Mucus PRESENT     Comment: Performed at [facility], [address], [place], [place] [address]  Renal function panel     Status: Abnormal   Collection Time: 08/30/21  6:36 PM  Result Value Ref Range   Sodium 142 135 - 145 mmol/L   Potassium 3.5 3.5 - 5.1 mmol/L    Comment: DELTA CHECK NOTED   Chloride 105 98 - 111 mmol/L   CO2 25 22 - 32  mmol/L   Glucose, Bld 102 (H) 70 - 99 mg/dL    Comment: Glucose reference range applies only to samples taken after fasting for at least 8 hours.   BUN 12 6 - 20 mg/dL   Creatinine, Ser 8.29 0.44 - 1.00 mg/dL   Calcium 9.1 8.9 - 56.2 mg/dL   Phosphorus 2.7 2.5 - 4.6 mg/dL   Albumin 4.5 3.5 - 5.0 g/dL   GFR, Estimated >13 >08 mL/min    Comment: (NOTE) Calculated using the CKD-EPI Creatinine Equation (2021)    Anion gap 12 5 - 15    Comment: Performed at [facility], [address], [place], [place] [address]  Comprehensive metabolic panel     Status: Abnormal   Collection Time: 08/31/21  4:34 AM  Result Value Ref Range   Sodium 140 135 - 145 mmol/L   Potassium 3.4 (L) 3.5 - 5.1 mmol/L   Chloride 109 98 - 111 mmol/L   CO2 23 22 - 32 mmol/L   Glucose, Bld 101 (H) 70 - 99 mg/dL    Comment: Glucose reference range applies only to samples taken after fasting for at least 8 hours.   BUN 11 6 - 20 mg/dL   Creatinine, Ser 6.96 0.44 - 1.00 mg/dL   Calcium 8.9 8.9 - 29.5 mg/dL   Total Protein 7.2 6.5 - 8.1 g/dL   Albumin 3.9 3.5 - 5.0 g/dL   AST 12 (L) 15 - 41 U/L   ALT 9 0 - 44 U/L   Alkaline Phosphatase 52 38 - 126 U/L   Total Bilirubin 1.0 0.3 - 1.2 mg/dL   GFR, Estimated >28 >41 mL/min    Comment: (NOTE) Calculated using the CKD-EPI Creatinine Equation (2021)    Anion gap 8 5 - 15    Comment: Performed at [facility], [address], [place], [place] [address]  CBC     Status: Abnormal   Collection Time: 08/31/21  4:34 AM  Result Value Ref Range   WBC 12.8 (H) 4.0 - 10.5 K/uL   RBC 3.94 3.87 - 5.11 MIL/uL   Hemoglobin  12.8 12.0 - 15.0 g/dL   HCT 08.6 76.1 - 95.0 %   MCV 97.7 80.0 - 100.0 fL   MCH 32.5 26.0 - 34.0 pg   MCHC 33.2 30.0 - 36.0 g/dL   RDW 93.2 67.1 - 24.5 %   Platelets 253 150 - 400 K/uL   nRBC 0.0 0.0 - 0.2 %    Comment: Performed at [facility], [address], [place], [place] [address]     Current Facility-Administered Medications  Medication Dose Route Frequency Provider Last Rate Last Admin   0.9 %  sodium chloride infusion   Intravenous Continuous Ronaldo Miyamoto, Tyrone A, DO 100 mL/hr at 08/31/21 0229 New Bag at 08/31/21 0229   ARIPiprazole (ABILIFY) tablet 2 mg  2 mg Oral Once Maryagnes Amos, FNP       And   [START ON 09/01/2021] ARIPiprazole (ABILIFY) tablet 5 mg  5 mg Oral Daily Starkes-Perry, Juel Burrow, FNP       busPIRone (BUSPAR) tablet 10 mg  10 mg Oral TID Maryagnes Amos, FNP   10 mg at 08/31/21 1535   dicyclomine (BENTYL) tablet 20 mg  20 mg Oral Q6H PRN Ronaldo Miyamoto, Tyrone A, DO       enoxaparin (LOVENOX) injection 40 mg  40 mg Subcutaneous Q24H Kyle, Tyrone A, DO   40 mg at 08/30/21 2151   [START ON 09/01/2021] escitalopram (LEXAPRO) tablet 10 mg  10 mg Oral QHS Maryagnes Amos, FNP       folic acid (FOLVITE) tablet 1 mg  1 mg Oral Daily Kyle, Tyrone A, DO   1 mg at 08/31/21 0850   loperamide (IMODIUM) capsule 2-4 mg  2-4 mg Oral PRN Ronaldo Miyamoto, Tyrone A, DO       LORazepam (ATIVAN) tablet 1-4 mg  1-4 mg Oral Q1H PRN Ronaldo Miyamoto, Tyrone A, DO   1 mg at 08/31/21 3382   Or   LORazepam (ATIVAN) injection 1-4 mg  1-4 mg Intravenous Q1H PRN Margie Ege A, DO   2 mg at 08/31/21 1536   methocarbamol (ROBAXIN) tablet 500 mg  500 mg Oral Q8H PRN Margie Ege A, DO   500 mg at 08/31/21 5053   multivitamin with minerals tablet 1 tablet  1 tablet Oral Daily Ronaldo Miyamoto, Tyrone A, DO   1 tablet at 08/31/21 0850   naproxen (NAPROSYN) tablet 500 mg  500 mg Oral BID PRN Margie Ege A, DO   500 mg at 08/30/21 2220   nicotine (NICODERM CQ - dosed in mg/24 hours) patch 14 mg  14 mg Transdermal Daily PRN Ronaldo Miyamoto, Tyrone A, DO       prochlorperazine (COMPAZINE) injection 10 mg  10 mg Intravenous Q6H PRN Ronaldo Miyamoto, Tyrone A, DO   10 mg at 08/31/21 9767   thiamine (VITAMIN B1) tablet 100 mg  100 mg Oral Daily Kyle, Tyrone A, DO   100 mg at 08/31/21 3419   Or   thiamine (VITAMIN B1) injection 100 mg  100 mg  Intravenous Daily Margie Ege A, DO        Musculoskeletal: Strength & Muscle Tone: within normal limits Gait & Station: normal Patient leans: N/A            Psychiatric Specialty Exam:  Presentation  General Appearance: Casual; Appropriate for Environment Eye Contact:Fair Speech:Clear and Coherent; Normal Rate Speech Volume:Normal Handedness:Right  Mood and Affect  Mood:Depressed; Anxious Affect:Tearful  Thought Process  Thought Processes:Linear; Coherent Descriptions of Associations:Intact  Orientation:Full (Time, Place and Person)  Thought Content:Logical  History of Schizophrenia/Schizoaffective disorder:No data recorded Duration of Psychotic Symptoms:No data recorded Hallucinations:Hallucinations: None  Ideas of Reference:None  Suicidal Thoughts:Suicidal Thoughts: No (Denies at this time, however initially presented for suicide attempt by hanging)  Homicidal Thoughts:Homicidal Thoughts: No   Sensorium  Memory:Immediate Good; Recent Good; Remote Good Judgment:Good Insight:Fair  Executive Functions  Concentration:Good Attention Span:Good Recall:Good Fund of Knowledge:Good Language:Good  Psychomotor Activity  Psychomotor Activity:Psychomotor Activity: Normal  Assets  Assets:Communication Skills; Physical Health; Resilience; Social Support; Health and safety inspector; Housing  Sleep  Sleep:Sleep: Good  Physical Exam: Physical Exam Vitals and nursing note reviewed.  Constitutional:      Appearance: Normal appearance. She is normal weight.  Neurological:     Mental Status: She is alert.  Psychiatric:        Attention and Perception: Attention and perception normal.        Mood and Affect: Mood is anxious and depressed. Affect is tearful.        Speech: She is communicative.        Behavior: Behavior normal. Behavior is cooperative.        Thought Content: Thought content includes suicidal ideation. Thought content includes suicidal  plan.        Cognition and Memory: Cognition and memory normal.        Judgment: Judgment normal.    ROS Blood pressure 130/87, pulse (!) 51, temperature 98.7 F (37.1 C), temperature source Oral, resp. rate 18, height 5\' 4"  (1.626 m), weight 59 kg, SpO2 99 %, not currently breastfeeding. Body mass index is 22.31 kg/m.  Treatment Plan Summary: Plan -Will resume previous medications to include Lexapro 5 mg p.o. and a single dose, plan to increase Lexapro to 10 mg p.o. daily on tomorrow August 8.  Patient also consented to resume BuSpar 10 mg p.o. 3 times daily for anxiety.  Patient is interested in initiation of Abilify for adjunct of therapy, worsening depression, and ongoing suicidal thoughts.  Recommend starting Abilify 2 mg p.o. x 2 days, plan to increase goal of Abilify 5 mg p.o. daily for maintenance.  May titrate for symptom management.  Please closely monitor for EPS and metabolic syndrome.  Patient will need to remain on suicide precautions, until felt safe by correctional staff that she is able to return to general population.   Disposition: No evidence of imminent risk to self or others at present.   Supportive therapy provided about ongoing stressors. Refer to IOP. Discussed crisis plan, support from social network, calling 911, coming to the Emergency Department, and calling Suicide Hotline.  02-16-1982, FNP 08/31/2021 4:09 PM

## 2021-12-10 ENCOUNTER — Ambulatory Visit: Payer: Medicaid Other | Admitting: Obstetrics and Gynecology

## 2022-01-11 ENCOUNTER — Ambulatory Visit: Payer: Medicaid Other | Admitting: Family Medicine

## 2022-01-20 ENCOUNTER — Encounter: Payer: Self-pay | Admitting: Obstetrics

## 2022-01-20 ENCOUNTER — Other Ambulatory Visit (HOSPITAL_COMMUNITY)
Admission: RE | Admit: 2022-01-20 | Discharge: 2022-01-20 | Disposition: A | Discharge status: Home / Self Care | Payer: Medicaid Other | Source: Ambulatory Visit | Attending: Obstetrics and Gynecology | Admitting: Obstetrics and Gynecology

## 2022-01-20 ENCOUNTER — Ambulatory Visit (INDEPENDENT_AMBULATORY_CARE_PROVIDER_SITE_OTHER): Payer: Medicaid Other | Admitting: Obstetrics

## 2022-01-20 VITALS — BP 106/63 | HR 53 | Ht 64.0 in | Wt 131.0 lb

## 2022-01-20 DIAGNOSIS — N760 Acute vaginitis: Secondary | ICD-10-CM

## 2022-01-20 DIAGNOSIS — L0292 Furuncle, unspecified: Secondary | ICD-10-CM | POA: Diagnosis not present

## 2022-01-20 DIAGNOSIS — Z01419 Encounter for gynecological examination (general) (routine) without abnormal findings: Secondary | ICD-10-CM

## 2022-01-20 DIAGNOSIS — Z01411 Encounter for gynecological examination (general) (routine) with abnormal findings: Secondary | ICD-10-CM

## 2022-01-20 DIAGNOSIS — B9689 Other specified bacterial agents as the cause of diseases classified elsewhere: Secondary | ICD-10-CM

## 2022-01-20 DIAGNOSIS — N898 Other specified noninflammatory disorders of vagina: Secondary | ICD-10-CM

## 2022-01-20 DIAGNOSIS — A6000 Herpesviral infection of urogenital system, unspecified: Secondary | ICD-10-CM | POA: Insufficient documentation

## 2022-01-20 DIAGNOSIS — Z0001 Encounter for general adult medical examination with abnormal findings: Secondary | ICD-10-CM | POA: Diagnosis not present

## 2022-01-20 DIAGNOSIS — F17200 Nicotine dependence, unspecified, uncomplicated: Secondary | ICD-10-CM

## 2022-01-20 MED ORDER — VALACYCLOVIR HCL 500 MG PO TABS: 500.0000 mg | ORAL_TABLET | Freq: Two times a day (BID) | ORAL | 11 refills | Status: DC

## 2022-01-20 MED ORDER — CLINDAMYCIN HCL 300 MG PO CAPS: 300.0000 mg | ORAL_CAPSULE | Freq: Three times a day (TID) | ORAL | 2 refills | Status: DC

## 2022-01-20 MED ORDER — METRONIDAZOLE 500 MG PO TABS: 500.0000 mg | ORAL_TABLET | Freq: Two times a day (BID) | ORAL | 6 refills | Status: DC

## 2022-01-20 NOTE — Progress Notes (Signed)
36 y.o GYN presents for Repeat PAP.  Previous specimen was non-diagnostic/unsatisfactory for evaluation.

## 2022-01-20 NOTE — Progress Notes (Signed)
Subjective:        [name] is a 36 y.o. female here for a routine exam.  Current complaints: Malodorous vaginal discharge .    Personal health questionnaire:  Is patient Ashkenazi Jewish, have a family history of breast and/or ovarian cancer: yes Is there a family history of uterine cancer diagnosed at age < 49, gastrointestinal cancer, urinary tract cancer, family member who is a Personnel officer syndrome-associated carrier: no Is the patient overweight and hypertensive, family history of diabetes, personal history of gestational diabetes, preeclampsia or PCOS: no Is patient over 76, have PCOS,  family history of premature CHD under age 12, diabetes, smoke, have hypertension or peripheral artery disease:  no At any time, has a partner hit, kicked or otherwise hurt or frightened you?: no Over the past 2 weeks, have you felt down, depressed or hopeless?: yes Over the past 2 weeks, have you felt little interest or pleasure in doing things?:yes   Gynecologic History No LMP recorded. Contraception: none Last Pap: 2023. Results were: unsatisfactory specimen.  Repeat pap recommended Last mammogram: n/a. Results were: n/a  Obstetric History OB History  Gravida Para Term Preterm AB Living  8 4 3 1 4 4   SAB IAB Ectopic Multiple Live Births    4   0 4    # Outcome Date GA Lbr Len/2nd Weight Sex Delivery Anes PTL Lv  8 Preterm 02/02/20 [redacted]w[redacted]d  5 lb 14.7 oz (2.685 kg) M Vag-Spont None  LIV     Birth Comments: WDL  7 IAB 12/2017          6 Term 04/17/14 [redacted]w[redacted]d 09:04 / 00:10 7 lb 2.5 oz (3.245 kg) F Vag-Spont EPI  LIV  5 Term 09/19/05 [redacted]w[redacted]d  9 lb (4.082 kg) M Vag-Spont   LIV  4 Term 09/16/03 [redacted]w[redacted]d   F Vag-Spont   LIV  3 IAB           2 IAB           1 IAB             Past Medical History:  Diagnosis Date   Depression    Drug addiction in remission (HCC)    H/O gonorrhea    HSV-2 infection    Pregnancy complicated by subutex maintenance, antepartum (HCC)    SVD (spontaneous  vaginal delivery) 04/17/2014    Past Surgical History:  Procedure Laterality Date   BREAST BIOPSY Right    BREAST SURGERY     reduction     Current Outpatient Medications:    ARIPiprazole (ABILIFY) 5 MG tablet, Take 1 tablet (5 mg total) by mouth daily., Disp: 30 tablet, Rfl: 0   valACYclovir (VALTREX) 500 MG tablet, Take 1 tablet (500 mg total) by mouth 2 (two) times daily. Take for 3 days for each outbreak., Disp: 30 tablet, Rfl: 11 Allergies  Allergen Reactions   Penicillins Rash and Other (See Comments)    Causes yeast infection Has patient had a PCN reaction causing immediate rash, facial/tongue/throat swelling, SOB or lightheadedness with hypotension: Yes Has patient had a PCN reaction causing severe rash involving mucus membranes or skin necrosis: No Has patient had a PCN reaction that required hospitalization: No Has patient had a PCN reaction occurring within the last 10 years: No If all of the above answers are "NO", then may proceed with Cephalosporin use.    Social History   Tobacco Use   Smoking status: Every Day    Packs/day: 0.25  Years: 10.00    Total pack years: 2.50    Types: Cigarettes   Smokeless tobacco: Never  Substance Use Topics   Alcohol use: No    Family History  Problem Relation Age of Onset   Breast cancer Mother    Stroke Mother    Breast cancer Maternal Grandmother    Depression Sister       Review of Systems  Constitutional: negative for fatigue and weight loss Respiratory: negative for cough and wheezing Cardiovascular: negative for chest pain, fatigue and palpitations Gastrointestinal: negative for abdominal pain and change in bowel habits Musculoskeletal:negative for myalgias Neurological: negative for gait problems and tremors Behavioral/Psych: negative for abusive relationship, depression Endocrine: negative for temperature intolerance    Genitourinary:itive for vaginal discharge.  negative for abnormal menstrual periods,  genital lesions, hot flashes, sexual problems  Integument/breast: negative for breast lump, breast tenderness, nipple discharge and skin lesion(s)    Objective:       BP 106/63   Pulse (!) 53   Ht 5\' 4"  (1.626 m)   Wt 131 lb (59.4 kg)   BMI 22.49 kg/m  General:   Alert and no distress  Skin:   no rash or abnormalities  Lungs:   clear to auscultation bilaterally  Heart:   regular rate and rhythm, S1, S2 normal, no murmur, click, rub or gallop  Breasts:   normal without suspicious masses, skin or nipple changes or axillary nodes  Abdomen:  normal findings: no organomegaly, soft, non-tender and no hernia  Pelvis:  External genitalia: normal general appearance Urinary system: urethral meatus normal and bladder without fullness, nontender Vaginal: normal without tenderness, induration or masses Cervix: normal appearance Adnexa: normal bimanual exam Uterus: anteverted and non-tender, normal size   Lab Review Urine pregnancy test Labs reviewed yes Radiologic studies reviewed no  I have spent a total of 20 minutes of face-to-face and non-face-to-face time, excluding clinical staff time, reviewing notes and preparing to see patient, ordering tests and/or medications, and counseling the patient.   Assessment:    1. Encounter for gynecological examination with Papanicolaou smear of cervix Rx: - Cytology - PAP( [facility])  2. Vaginal discharge Rx: - Cervicovaginal ancillary only( [place])  3. Herpes simplex infection of genitourinary system Rx: - valACYclovir (VALTREX) 500 MG tablet; Take 1 tablet (500 mg total) by mouth 2 (two) times daily. Take for 3 days for each outbreak.  Dispense: 30 tablet; Refill: 11  4. Boil Rx: - clindamycin (CLEOCIN) 300 MG capsule; Take 1 capsule (300 mg total) by mouth 3 (three) times daily.  Dispense: 21 capsule; Refill: 2  5. BV (bacterial vaginosis) Rx: - metroNIDAZOLE (FLAGYL) 500 MG tablet; Take 1 tablet (500 mg total) by mouth 2 (two)  times daily. Take for 7 days after period is over months.  Dispense: 14 tablet; Refill: 6  6. Tobacco dependence - cessation recommended     Plan:    Education reviewed: calcium supplements, depression evaluation, low fat, low cholesterol diet, safe sex/STD prevention, self breast exams, smoking cessation, and weight bearing exercise. Contraception: none. Follow up in: 1 year.   Meds ordered this encounter  Medications   valACYclovir (VALTREX) 500 MG tablet    Sig: Take 1 tablet (500 mg total) by mouth 2 (two) times daily. Take for 3 days for each outbreak.    Dispense:  30 tablet    Refill:  11   No orders of the defined types were placed in this encounter.   Shelli Portilla A.  Clearance Coots MD 01/20/2022

## 2022-01-21 ENCOUNTER — Other Ambulatory Visit: Payer: Self-pay | Admitting: Obstetrics

## 2022-01-21 LAB — CERVICOVAGINAL ANCILLARY ONLY
Bacterial Vaginitis (gardnerella): POSITIVE — AB
Candida Glabrata: NEGATIVE
Candida Vaginitis: NEGATIVE
Chlamydia: NEGATIVE
Comment: NEGATIVE
Comment: NEGATIVE
Comment: NEGATIVE
Comment: NEGATIVE
Comment: NEGATIVE
Comment: NORMAL
Neisseria Gonorrhea: NEGATIVE
Trichomonas: POSITIVE — AB

## 2022-01-22 ENCOUNTER — Telehealth: Payer: Self-pay | Admitting: Emergency Medicine

## 2022-01-22 LAB — CYTOLOGY - PAP
Comment: NEGATIVE
Diagnosis: NEGATIVE
Diagnosis: REACTIVE
High risk HPV: NEGATIVE

## 2022-01-22 NOTE — Telephone Encounter (Signed)
Pt informed of results, Rx.   

## 2022-01-22 NOTE — Telephone Encounter (Signed)
-----   Message from Brock Bad, MD sent at 01/21/2022  9:54 PM EST ----- Flagyl Rx for BV and Trichomonas

## 2022-03-01 ENCOUNTER — Other Ambulatory Visit (HOSPITAL_COMMUNITY)
Admission: EM | Admit: 2022-03-01 | Discharge: 2022-03-03 | Disposition: A | Discharge status: Home / Self Care | Payer: Medicaid Other | Attending: Psychiatry | Admitting: Psychiatry

## 2022-03-01 DIAGNOSIS — F411 Generalized anxiety disorder: Secondary | ICD-10-CM | POA: Insufficient documentation

## 2022-03-01 DIAGNOSIS — F1911 Other psychoactive substance abuse, in remission: Secondary | ICD-10-CM | POA: Insufficient documentation

## 2022-03-01 DIAGNOSIS — Z87891 Personal history of nicotine dependence: Secondary | ICD-10-CM | POA: Insufficient documentation

## 2022-03-01 DIAGNOSIS — F1124 Opioid dependence with opioid-induced mood disorder: Secondary | ICD-10-CM | POA: Diagnosis not present

## 2022-03-01 DIAGNOSIS — Z765 Malingerer [conscious simulation]: Secondary | ICD-10-CM | POA: Insufficient documentation

## 2022-03-01 DIAGNOSIS — F319 Bipolar disorder, unspecified: Secondary | ICD-10-CM | POA: Insufficient documentation

## 2022-03-01 DIAGNOSIS — F4310 Post-traumatic stress disorder, unspecified: Secondary | ICD-10-CM | POA: Diagnosis present

## 2022-03-01 DIAGNOSIS — Z79899 Other long term (current) drug therapy: Secondary | ICD-10-CM | POA: Insufficient documentation

## 2022-03-01 DIAGNOSIS — R9431 Abnormal electrocardiogram [ECG] [EKG]: Secondary | ICD-10-CM | POA: Insufficient documentation

## 2022-03-01 DIAGNOSIS — F1910 Other psychoactive substance abuse, uncomplicated: Secondary | ICD-10-CM | POA: Diagnosis present

## 2022-03-01 DIAGNOSIS — F1020 Alcohol dependence, uncomplicated: Secondary | ICD-10-CM | POA: Insufficient documentation

## 2022-03-01 DIAGNOSIS — I517 Cardiomegaly: Secondary | ICD-10-CM | POA: Insufficient documentation

## 2022-03-01 DIAGNOSIS — F603 Borderline personality disorder: Secondary | ICD-10-CM | POA: Insufficient documentation

## 2022-03-01 DIAGNOSIS — F1310 Sedative, hypnotic or anxiolytic abuse, uncomplicated: Secondary | ICD-10-CM | POA: Diagnosis present

## 2022-03-01 DIAGNOSIS — Z653 Problems related to other legal circumstances: Secondary | ICD-10-CM | POA: Insufficient documentation

## 2022-03-01 DIAGNOSIS — F332 Major depressive disorder, recurrent severe without psychotic features: Secondary | ICD-10-CM | POA: Insufficient documentation

## 2022-03-01 DIAGNOSIS — F1410 Cocaine abuse, uncomplicated: Secondary | ICD-10-CM

## 2022-03-01 DIAGNOSIS — F17210 Nicotine dependence, cigarettes, uncomplicated: Secondary | ICD-10-CM | POA: Diagnosis present

## 2022-03-01 DIAGNOSIS — Z1152 Encounter for screening for COVID-19: Secondary | ICD-10-CM | POA: Insufficient documentation

## 2022-03-01 LAB — URINALYSIS, COMPLETE (UACMP) WITH MICROSCOPIC
Glucose, UA: NEGATIVE mg/dL
Hgb urine dipstick: NEGATIVE
Ketones, ur: 20 mg/dL — AB
Leukocytes,Ua: NEGATIVE
Nitrite: NEGATIVE
Protein, ur: 100 mg/dL — AB
Specific Gravity, Urine: 1.029 (ref 1.005–1.030)
pH: 5 (ref 5.0–8.0)

## 2022-03-01 LAB — POCT URINE DRUG SCREEN - MANUAL ENTRY (I-SCREEN)
POC Amphetamine UR: POSITIVE — AB
POC Buprenorphine (BUP): NOT DETECTED
POC Cocaine UR: POSITIVE — AB
POC Marijuana UR: POSITIVE — AB
POC Methadone UR: NOT DETECTED
POC Methamphetamine UR: POSITIVE — AB
POC Morphine: POSITIVE — AB
POC Oxazepam (BZO): NOT DETECTED
POC Oxycodone UR: NOT DETECTED — AB
POC Secobarbital (BAR): NOT DETECTED

## 2022-03-01 LAB — COMPREHENSIVE METABOLIC PANEL
ALT: 11 U/L (ref 0–44)
AST: 27 U/L (ref 15–41)
Albumin: 5.2 g/dL — ABNORMAL HIGH (ref 3.5–5.0)
Alkaline Phosphatase: 82 U/L (ref 38–126)
Anion gap: 17 — ABNORMAL HIGH (ref 5–15)
BUN: 11 mg/dL (ref 6–20)
CO2: 24 mmol/L (ref 22–32)
Calcium: 10.5 mg/dL — ABNORMAL HIGH (ref 8.9–10.3)
Chloride: 97 mmol/L — ABNORMAL LOW (ref 98–111)
Creatinine, Ser: 0.83 mg/dL (ref 0.44–1.00)
GFR, Estimated: >60 mL/min (ref >60)
Glucose, Bld: 53 mg/dL — ABNORMAL LOW (ref 70–99)
Potassium: 3.7 mmol/L (ref 3.5–5.1)
Sodium: 138 mmol/L (ref 135–145)
Total Bilirubin: 1.5 mg/dL — ABNORMAL HIGH (ref 0.3–1.2)
Total Protein: 9 g/dL — ABNORMAL HIGH (ref 6.5–8.1)

## 2022-03-01 LAB — MAGNESIUM: Magnesium: 2.2 mg/dL (ref 1.7–2.4)

## 2022-03-01 LAB — LIPID PANEL
Cholesterol: 171 mg/dL (ref 0–200)
HDL: 57 mg/dL (ref >40)
LDL Cholesterol: 97 mg/dL (ref 0–99)
Total CHOL/HDL Ratio: 3 RATIO
Triglycerides: 83 mg/dL (ref <150)
VLDL: 17 mg/dL (ref 0–40)

## 2022-03-01 LAB — POCT PREGNANCY, URINE: Preg Test, Ur: NEGATIVE

## 2022-03-01 LAB — RESP PANEL BY RT-PCR (RSV, FLU A&B, COVID)  RVPGX2
Influenza A by PCR: NEGATIVE
Influenza B by PCR: NEGATIVE
Resp Syncytial Virus by PCR: NEGATIVE
SARS Coronavirus 2 by RT PCR: NEGATIVE

## 2022-03-01 LAB — HIV ANTIBODY (ROUTINE TESTING W REFLEX): HIV Screen 4th Generation wRfx: NONREACTIVE

## 2022-03-01 LAB — POC SARS CORONAVIRUS 2 AG: SARSCOV2ONAVIRUS 2 AG: NEGATIVE

## 2022-03-01 LAB — TSH: TSH: 3.388 u[IU]/mL (ref 0.350–4.500)

## 2022-03-01 LAB — POC URINE PREG, ED: Preg Test, Ur: NEGATIVE

## 2022-03-01 LAB — ETHANOL: Alcohol, Ethyl (B): <10 mg/dL (ref <10)

## 2022-03-01 MED ORDER — TRAZODONE HCL 50 MG PO TABS: 50.0000 mg | ORAL_TABLET | Freq: Every evening | ORAL | Status: DC | PRN

## 2022-03-01 MED ORDER — ALUM & MAG HYDROXIDE-SIMETH 200-200-20 MG/5ML PO SUSP: 30.0000 mL | ORAL | Status: DC | PRN

## 2022-03-01 MED ORDER — HYDROXYZINE HCL 25 MG PO TABS
25.0000 mg | ORAL_TABLET | Freq: Four times a day (QID) | ORAL | Status: DC | PRN
  Administered 2022-03-02 – 2022-03-03 (×2): 25 mg via ORAL
  Filled 2022-03-01 (×2): qty 1

## 2022-03-01 MED ORDER — ONDANSETRON 4 MG PO TBDP: 4.0000 mg | ORAL_TABLET | Freq: Four times a day (QID) | ORAL | Status: DC | PRN

## 2022-03-01 MED ORDER — CLONIDINE HCL 0.1 MG PO TABS
0.1000 mg | ORAL_TABLET | Freq: Four times a day (QID) | ORAL | Status: DC
  Administered 2022-03-01 (×2): 0.1 mg via ORAL
  Filled 2022-03-01 (×3): qty 1

## 2022-03-01 MED ORDER — HYDROXYZINE HCL 25 MG PO TABS: 25.0000 mg | ORAL_TABLET | Freq: Four times a day (QID) | ORAL | Status: DC | PRN

## 2022-03-01 MED ORDER — LORAZEPAM 1 MG PO TABS: 1.0000 mg | ORAL_TABLET | Freq: Four times a day (QID) | ORAL | Status: DC | PRN

## 2022-03-01 MED ORDER — NAPROXEN 500 MG PO TABS
500.0000 mg | ORAL_TABLET | Freq: Two times a day (BID) | ORAL | Status: DC | PRN
  Administered 2022-03-03: 500 mg via ORAL
  Filled 2022-03-01: qty 1

## 2022-03-01 MED ORDER — MAGNESIUM HYDROXIDE 400 MG/5ML PO SUSP: 30.0000 mL | Freq: Every day | ORAL | Status: DC | PRN

## 2022-03-01 MED ORDER — LOPERAMIDE HCL 2 MG PO CAPS: 2.0000 mg | ORAL_CAPSULE | ORAL | Status: DC | PRN

## 2022-03-01 MED ORDER — CLINDAMYCIN HCL 150 MG PO CAPS
300.0000 mg | ORAL_CAPSULE | Freq: Three times a day (TID) | ORAL | Status: DC
  Administered 2022-03-01 – 2022-03-03 (×5): 300 mg via ORAL
  Filled 2022-03-01 (×5): qty 2

## 2022-03-01 MED ORDER — TRIPLE ANTIBIOTIC 3.5-400-5000 EX OINT
1.0000 | TOPICAL_OINTMENT | Freq: Three times a day (TID) | CUTANEOUS | Status: DC
  Administered 2022-03-01 – 2022-03-03 (×5): 1 via TOPICAL
  Filled 2022-03-01 (×5): qty 1

## 2022-03-01 MED ORDER — THIAMINE MONONITRATE 100 MG PO TABS
100.0000 mg | ORAL_TABLET | Freq: Every day | ORAL | Status: DC
  Administered 2022-03-02 – 2022-03-03 (×2): 100 mg via ORAL
  Filled 2022-03-01 (×2): qty 1

## 2022-03-01 MED ORDER — SACCHAROMYCES BOULARDII 250 MG PO CAPS
500.0000 mg | ORAL_CAPSULE | Freq: Every day | ORAL | Status: DC
  Administered 2022-03-02 – 2022-03-03 (×2): 500 mg via ORAL
  Filled 2022-03-01 (×2): qty 2

## 2022-03-01 MED ORDER — CLONIDINE HCL 0.1 MG PO TABS: 0.1000 mg | ORAL_TABLET | ORAL | Status: DC

## 2022-03-01 MED ORDER — THIAMINE HCL 100 MG/ML IJ SOLN
100.0000 mg | Freq: Once | INTRAMUSCULAR | Status: AC
  Administered 2022-03-01: 100 mg via INTRAMUSCULAR
  Filled 2022-03-01: qty 2

## 2022-03-01 MED ORDER — DICYCLOMINE HCL 20 MG PO TABS
20.0000 mg | ORAL_TABLET | Freq: Four times a day (QID) | ORAL | Status: DC | PRN
  Filled 2022-03-01: qty 1

## 2022-03-01 MED ORDER — ADULT MULTIVITAMIN W/MINERALS CH
1.0000 | ORAL_TABLET | Freq: Every day | ORAL | Status: DC
  Administered 2022-03-01 – 2022-03-03 (×3): 1 via ORAL
  Filled 2022-03-01 (×3): qty 1

## 2022-03-01 MED ORDER — ACETAMINOPHEN 325 MG PO TABS: 650.0000 mg | ORAL_TABLET | Freq: Four times a day (QID) | ORAL | Status: DC | PRN

## 2022-03-01 MED ORDER — METHOCARBAMOL 500 MG PO TABS
500.0000 mg | ORAL_TABLET | Freq: Three times a day (TID) | ORAL | Status: DC | PRN
  Administered 2022-03-02: 500 mg via ORAL
  Filled 2022-03-01: qty 1

## 2022-03-01 MED ORDER — CLONIDINE HCL 0.1 MG PO TABS: 0.1000 mg | ORAL_TABLET | Freq: Every day | ORAL | Status: DC

## 2022-03-01 NOTE — ED Notes (Addendum)
Ankle monitor charger at nurses station with patient label on it.

## 2022-03-01 NOTE — ED Provider Notes (Cosign Needed Addendum)
Facility Based Crisis Admission H&P  Date: 03/01/22 Patient Name: [name] MRN: [id number] Chief Complaint: "I am  ready to get clean"  Diagnoses:  Final diagnoses:  Polysubstance abuse ([place])    HPI: patient presented to [facility] as a walk in accompanied by her friend with complaints of, "I am ready to get clean".  She initially presented to [facility] and was recommended to come to [facility] for opioid detox.  [name], 37 y.o., female patient seen face to face by this provider and chart reviewed on 03/01/22.  Per chart review patient has past psychiatric history of bipolar 1 disorder with psychotic features, opioid dependence, nicotine dependence, borderline personality disorder, MDD, and PTSD.  She is currently not prescribed any medications.  She has no outpatient psychiatric services in place.  She works full-time in home health care.  She has a home and has weekend privileges for 3 of her children to visit.-Child is in the custody of the paternal grandparents.  She is on probation and has a ankle bracelet on her right ankle. She pays out of pocket $500 a month for her own ankle bracelet monitoring.  She has a court date on 04/16/2022 and on 02/25/2022.  She has an attorney that will attend these court dates for her.  She has completed residential treatment at [facility] in the past.  On evaluation [name] reports over one year ago she was doing well and was maintaining her sobriety from substances.  She had an ongoing legal battle to obtain custody of her youngest child from the paternal grandparents.  States she was given permission to go pick her child up and when she did she got arrested for kidnapping and went to jail.  During that time she relapsed on fentanyl and had an overdose.  Also around that time 03/15/2021 she was abusing benzodiazepines.  She was hospitalized after a witnessed seizure like activity.  She was seen by neurology during that admission and was cleared.   She has remained free from all substance use until roughly 2-3 months ago she had a relapse on fentanyl and has been using regularly since that time.  She uses 3.5 g every 2 days.  Her preferred route is injection.  However she has blown most of her veins.  She has a dime sized scabbed area on her left inner wrist from injecting.  It appears reddened and hot.  She is complaining of pain in this area.  She has multiple scars, bruising, injection marks throughout all of her extremities.  She was injecting this past Thursday, was unable to "hit" a vein since that time so she has been snorting and eating fentanyl.  Her last use was 1 hour before presenting to [facility] by injection states, "I finally got it hit".  In addition to the fentanyl she has been occasionally using xanax off the street.  She has 2 mg Xanax bars and generally uses 2 bars but only does this occasionally roughly 1-5 times a month.  Her last use was Thursday.  She abuses alcohol occasionally with her last drink being this past weekend.  She used methamphetamines on Friday, she inserted through her rectum.  Reports she rarely uses methamphetamines.  During evaluation [name] is observed sitting in the assessment room in no acute distress.  She is disheveled and makes good eye contact.  She is alert/oriented x 4, cooperative, and attentive.  She has normal speech and is talkative.  She endorses an increase in  anxiety and depression with feelings of guilt, decreased appetite, decreased sleep, and increased tearfulness.  She has a euthymic affect and smiles at times through the assessment.  She denies SI/HI/AVH.  She does not appear to be responding to internal/external stimuli.  She denies paranoia and delusional thought content.  She is able to answer questions appropriately.  She is seeking detox and residential substance abuse treatment.  Discussed admission to the continuous assessment unit due to [facility] having no bed availability at  this time.  Explained the milieu and expectations.  Patient is in agreement.  Total Time spent with patient: 30 minutes  Musculoskeletal  Strength & Muscle Tone: within normal limits Gait & Station: normal Patient leans: N/A  Psychiatric Specialty Exam  Presentation General Appearance:  Casual; Disheveled  Eye Contact: Good  Speech: Clear and Coherent; Normal Rate  Speech Volume: Normal  Handedness: Right   Mood and Affect  Mood: Anxious; Euthymic  Affect: Congruent   Thought Process  Thought Processes: Coherent  Descriptions of Associations:Intact  Orientation:Full (Time, Place and Person)  Thought Content:Logical    Hallucinations:Hallucinations: None  Ideas of Reference:None  Suicidal Thoughts:Suicidal Thoughts: No  Homicidal Thoughts:Homicidal Thoughts: No   Sensorium  Memory: Immediate Good; Recent Good; Remote Good  Judgment: Poor  Insight: Fair   Community education officer  Concentration: Good  Attention Span: Good  Recall: Good  Fund of Knowledge: Good  Language: Good   Psychomotor Activity  Psychomotor Activity: Psychomotor Activity: Normal   Assets  Assets: Communication Skills; Desire for Improvement; Financial Resources/Insurance; Physical Health; Resilience; Social Support; Vocational/Educational   Sleep  Sleep: Sleep: Poor Number of Hours of Sleep: 4   Nutritional Assessment (For OBS and FBC admissions only) Has the patient had a weight loss or gain of 10 pounds or more in the last 3 months?: Yes Has the patient had a decrease in food intake/or appetite?: Yes Does the patient have dental problems?: No Does the patient have eating habits or behaviors that may be indicators of an eating disorder including binging or inducing vomiting?: No Has the patient recently lost weight without trying?: 2.0 Has the patient been eating poorly because of a decreased appetite?: 1 Malnutrition Screening Tool Score:  3    Physical Exam Vitals and nursing note reviewed.  Constitutional:      General: She is not in acute distress.    Appearance: Normal appearance. She is well-developed.  HENT:     Head: Normocephalic and atraumatic.  Eyes:     General:        Right eye: No discharge.        Left eye: No discharge.     Conjunctiva/sclera: Conjunctivae normal.  Cardiovascular:     Rate and Rhythm: Normal rate.  Pulmonary:     Effort: Pulmonary effort is normal. No respiratory distress.  Musculoskeletal:        General: Normal range of motion.     Cervical back: Normal range of motion.  Skin:    Coloration: Skin is not jaundiced.          Comments: Right wright/thumb area has Scabbed area it is reddened - from injecting   Multiple injection marks and bruising in antecubital area and thought out body, including front of shins and ankles.   Left ankle has ankle bracelet for tracking.   Neurological:     Mental Status: She is alert and oriented to person, place, and time.  Psychiatric:        Mood and  Affect: Mood normal.    Review of Systems  Constitutional: Negative.   HENT: Negative.    Eyes: Negative.   Respiratory: Negative.    Cardiovascular: Negative.   Musculoskeletal: Negative.   Skin: Negative.   Neurological: Negative.   Psychiatric/Behavioral:  Positive for depression and substance abuse. The patient is nervous/anxious and has insomnia.     Blood pressure (!) 127/94, pulse 100, temperature 97.6 F (36.4 C), temperature source Oral, resp. rate 18, SpO2 99 %. There is no height or weight on file to calculate BMI.  Past Psychiatric History: Per chart review patient has past psychiatric history of bipolar 1 disorder with psychotic features, opioid dependence, alcohol abuse, nicotine dependence, borderline personality disorder, MDD, and PTSD.  Is the patient at risk to self? No  Has the patient been a risk to self in the past 6 months? No .    Has the patient been a risk to  self within the distant past? Yes   Is the patient a risk to others? No   Has the patient been a risk to others in the past 6 months? No   Has the patient been a risk to others within the distant past? No   Past Medical History: Seizure from benzodiazepine withdrawal, leukocytosis, HPV  Family History: Maternal -denies Biological father -alcoholism, Sister -depression and SI.  Social History: [place] Substance Use Alcohol/Drug Use: Alcohol / Drug Use Pain Medications: See MAR (ptreports past use of percocets) Prescriptions: See MAR Over the Counter: See MAR History of alcohol / drug use?: Yes Longest period of sobriety (when/how long): One year 2014-15 Negative Consequences of Use: Financial Withdrawal Symptoms: None Substance #1 Name of Substance 1: Fenttanyl 1 - Age of First Use: 1s 1 - Amount (size/oz): 3.5 grams 1 - Frequency: over two day period, daily use 1 - Duration: 2 months 1 - Last Use / Amount: PTA - 0.3 g 1 - Method of Aquiring: N/A 1- Route of Use: IV Substance #2 Name of Substance 2: Benzos 2 - Age of First Use: 81s 2 - Amount (size/oz): Varies 2 - Frequency: Patient reports 1-5 times per month 2 - Duration: ongoing 2 - Last Use / Amount: Thursday 2 - Method of Aquiring: NA 2 - Route of Substance Use: ingests  Employment/Work Situation: Employment / Work Situation Employment Situation: Employed Patient's Job has Been Impacted by Current Illness: Yes Describe how Patient's Job has Been Impacted: 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?: No   Education: Education Is Patient Currently Attending School?: No Last Grade Completed: 12 Did You Product manager?: No Did You Have An Individualized Education Program (IIEP): No Did You Have Any Difficulty At School?: No Patient's Education Has Been Impacted by Current Illness: No  Religion: Religion/Spirituality Are You A  Religious Person?: No How Might This Affect Treatment?: N/A  Last Labs:  Office Visit on 01/20/2022  Component Date Value Ref Range Status   High risk HPV 01/20/2022 Negative   Final   Adequacy 01/20/2022 Satisfactory for evaluation; transformation zone component PRESENT.   Final   Diagnosis 01/20/2022 - Negative for Intraepithelial Lesions or Malignancy (NILM)   Final   Diagnosis 01/20/2022 - Benign reactive/reparative changes   Final   Microorganisms 01/20/2022 Trichomonas vaginalis present   Final   Comment 01/20/2022 Normal Reference Range HPV - Negative   Final   Neisseria Gonorrhea 01/20/2022 Negative   Final  Chlamydia 01/20/2022 Negative   Final   Trichomonas 01/20/2022 Positive (A)   Final   Bacterial Vaginitis (gardnerella) 01/20/2022 Positive (A)   Final   Candida Vaginitis 01/20/2022 Negative   Final   Candida Glabrata 01/20/2022 Negative   Final   Comment 01/20/2022 Normal Reference Range Bacterial Vaginosis - Negative   Final   Comment 01/20/2022 Normal Reference Range Candida Species - Negative   Final   Comment 01/20/2022 Normal Reference Range Candida Galbrata - Negative   Final   Comment 01/20/2022 Normal Reference Range Trichomonas - Negative   Final   Comment 01/20/2022 Normal Reference Ranger Chlamydia - Negative   Final   Comment 01/20/2022 Normal Reference Range Neisseria Gonorrhea - Negative   Final  Admission on 08/30/2021, Discharged on 08/31/2021  Component Date Value Ref Range Status   Sodium 08/30/2021 139  135 - 145 mmol/L Final   Potassium 08/30/2021 2.6 (LL)  3.5 - 5.1 mmol/L Final   Comment: CRITICAL RESULT CALLED TO, READ BACK BY AND VERIFIED WITH GARRISON,G. RN AT 1053 08/30/21 MULLINS,T    Chloride 08/30/2021 91 (L)  98 - 111 mmol/L Final   CO2 08/30/2021 31  22 - 32 mmol/L Final   Glucose, Bld 08/30/2021 112 (H)  70 - 99 mg/dL Final   Glucose reference range applies only to samples taken after fasting for at least 8 hours.   BUN 08/30/2021 19   6 - 20 mg/dL Final   Creatinine, Ser 08/30/2021 0.97  0.44 - 1.00 mg/dL Final   Calcium 09/47/0962 10.3  8.9 - 10.3 mg/dL Final   Total Protein 83/66/2947 10.7 (H)  6.5 - 8.1 g/dL Final   Albumin 65/46/5035 5.9 (H)  3.5 - 5.0 g/dL Final   AST 46/56/8127 16  15 - 41 U/L Final   ALT 08/30/2021 11  0 - 44 U/L Final   Alkaline Phosphatase 08/30/2021 78  38 - 126 U/L Final   Total Bilirubin 08/30/2021 1.2  0.3 - 1.2 mg/dL Final   GFR, Estimated 08/30/2021 >60  >60 mL/min Final   Comment: (NOTE) Calculated using the CKD-EPI Creatinine Equation (2021)    Anion gap 08/30/2021 17 (H)  5 - 15 Final   Performed at [facility], [address] [place], [place] [address]   Alcohol, Ethyl (B) 08/30/2021 <10  <10 mg/dL Final   Comment: (NOTE) Lowest detectable limit for serum alcohol is 10 mg/dL.  For medical purposes only. Performed at [facility], [address] [place], [place] [address]    Salicylate Lvl 08/30/2021 <7.0 (L)  7.0 - 30.0 mg/dL Final   Performed at [facility], [address] [place], [place] [address]   Acetaminophen (Tylenol), Serum 08/30/2021 <10 (L)  10 - 30 ug/mL Final   Comment: (NOTE) Therapeutic concentrations vary significantly. A range of 10-30 ug/mL  may be an effective concentration for many patients. However, some  are best treated at concentrations outside of this range. Acetaminophen concentrations >150 ug/mL at 4 hours after ingestion  and >50 ug/mL at 12 hours after ingestion are often associated with  toxic reactions.  Performed at [facility], [address], [place], [place] [address]    WBC 08/30/2021 12.1 (H)  4.0 - 10.5 K/uL Final   RBC 08/30/2021 4.90  3.87 - 5.11 MIL/uL Final   Hemoglobin 08/30/2021 16.0 (H)  12.0 - 15.0 g/dL Final   HCT 46/65/9935 46.1 (H)  36.0 - 46.0 % Final   MCV  08/30/2021 94.1  80.0 - 100.0 fL Final   MCH 08/30/2021 32.7  26.0 - 34.0 pg Final    MCHC 08/30/2021 34.7  30.0 - 36.0 g/dL Final   RDW 81/19/147808/06/2021 13.2  11.5 - 15.5 % Final   Platelets 08/30/2021 380  150 - 400 K/uL Final   nRBC 08/30/2021 0.0  0.0 - 0.2 % Final   Performed at [facility], [address] [place], [place] [address]   Opiates 08/30/2021 NONE DETECTED  NONE DETECTED Final   Cocaine 08/30/2021 NONE DETECTED  NONE DETECTED Final   Benzodiazepines 08/30/2021 NONE DETECTED  NONE DETECTED Final   Amphetamines 08/30/2021 NONE DETECTED  NONE DETECTED Final   Tetrahydrocannabinol 08/30/2021 POSITIVE (A)  NONE DETECTED Final   Barbiturates 08/30/2021 NONE DETECTED  NONE DETECTED Final   Comment: (NOTE) DRUG SCREEN FOR MEDICAL PURPOSES ONLY.  IF CONFIRMATION IS NEEDED FOR ANY PURPOSE, NOTIFY LAB WITHIN 5 DAYS.  LOWEST DETECTABLE LIMITS FOR URINE DRUG SCREEN Drug Class                     Cutoff (ng/mL) Amphetamine and metabolites    1000 Barbiturate and metabolites    200 Benzodiazepine                 200 Tricyclics and metabolites     300 Opiates and metabolites        300 Cocaine and metabolites        300 THC                            50 Performed at [facility], [address] [place], [place] [address]    hCG, Conley RollsBeta Chain, Quant, S 08/30/2021 2  <5 mIU/mL Final   Comment:          GEST. AGE      CONC.  (mIU/mL)   <=1 WEEK        5 - 50     2 WEEKS       50 - 500     3 WEEKS       100 - 10,000     4 WEEKS     1,000 - 30,000     5 WEEKS     3,500 - 115,000   6-8 WEEKS     12,000 - 270,000    12 WEEKS     15,000 - 220,000        FEMALE AND NON-PREGNANT FEMALE:     LESS THAN 5 mIU/mL Performed at [facility], [address] [place], [place] [address]    Magnesium 08/30/2021 2.1  1.7 - 2.4 mg/dL Final   Performed at [facility], [address] [place], [place] [address]   Phosphorus 08/30/2021 3.9  2.5 - 4.6 mg/dL Final   Performed at [facility], [address] [place], [place] [address]   Color, Urine 08/30/2021 YELLOW  YELLOW Final   APPearance 08/30/2021 HAZY (A)  CLEAR Final   Specific Gravity, Urine 08/30/2021 1.028  1.005 - 1.030 Final   pH 08/30/2021 7.0  5.0 - 8.0 Final   Glucose, UA 08/30/2021 NEGATIVE  NEGATIVE mg/dL Final   Hgb urine dipstick 08/30/2021 MODERATE (A)  NEGATIVE Final   Bilirubin Urine 08/30/2021 NEGATIVE  NEGATIVE Final   Ketones, ur 08/30/2021 80 (A)  NEGATIVE mg/dL Final   Protein, ur 24/40/102708/06/2021 30 (  A)  NEGATIVE mg/dL Final   Nitrite 16/10/960408/06/2021 NEGATIVE  NEGATIVE Final   Leukocytes,Ua 08/30/2021 TRACE (A)  NEGATIVE Final   RBC / HPF 08/30/2021 0-5  0 - 5 RBC/hpf Final   WBC, UA 08/30/2021 11-20  0 - 5 WBC/hpf Final   Bacteria, UA 08/30/2021 NONE SEEN  NONE SEEN Final   Squamous Epithelial / HPF 08/30/2021 11-20  0 - 5 Final   Mucus 08/30/2021 PRESENT   Final   Performed at [facility], [address] [place], [place] [address]   Sodium 08/31/2021 140  135 - 145 mmol/L Final   Potassium 08/31/2021 3.4 (L)  3.5 - 5.1 mmol/L Final   Chloride 08/31/2021 109  98 - 111 mmol/L Final   CO2 08/31/2021 23  22 - 32 mmol/L Final   Glucose, Bld 08/31/2021 101 (H)  70 - 99 mg/dL Final   Glucose reference range applies only to samples taken after fasting for at least 8 hours.   BUN 08/31/2021 11  6 - 20 mg/dL Final   Creatinine, Ser 08/31/2021 0.78  0.44 - 1.00 mg/dL Final   Calcium 11/91/478208/07/2021 8.9  8.9 - 10.3 mg/dL Final   Total Protein 95/62/130808/07/2021 7.2  6.5 - 8.1 g/dL Final   Albumin 65/78/469608/07/2021 3.9  3.5 - 5.0 g/dL Final   AST 29/52/841308/07/2021 12 (L)  15 - 41 U/L Final   ALT 08/31/2021 9  0 - 44 U/L Final   Alkaline Phosphatase 08/31/2021 52  38 - 126 U/L Final   Total Bilirubin 08/31/2021 1.0  0.3 - 1.2 mg/dL Final   GFR, Estimated 08/31/2021 >60  >60 mL/min Final   Comment: (NOTE) Calculated using the CKD-EPI Creatinine Equation (2021)    Anion gap 08/31/2021 8  5 - 15 Final    Performed at [facility], [address] [place], [place] [address]   WBC 08/31/2021 12.8 (H)  4.0 - 10.5 K/uL Final   RBC 08/31/2021 3.94  3.87 - 5.11 MIL/uL Final   Hemoglobin 08/31/2021 12.8  12.0 - 15.0 g/dL Final   HCT 02/72/536608/07/2021 38.5  36.0 - 46.0 % Final   MCV 08/31/2021 97.7  80.0 - 100.0 fL Final   MCH 08/31/2021 32.5  26.0 - 34.0 pg Final   MCHC 08/31/2021 33.2  30.0 - 36.0 g/dL Final   RDW 44/03/474208/07/2021 13.2  11.5 - 15.5 % Final   Platelets 08/31/2021 253  150 - 400 K/uL Final   nRBC 08/31/2021 0.0  0.0 - 0.2 % Final   Performed at [facility], [address] [place], [place] [address]   Sodium 08/30/2021 142  135 - 145 mmol/L Final   Potassium 08/30/2021 3.5  3.5 - 5.1 mmol/L Final   DELTA CHECK NOTED   Chloride 08/30/2021 105  98 - 111 mmol/L Final   CO2 08/30/2021 25  22 - 32 mmol/L Final   Glucose, Bld 08/30/2021 102 (H)  70 - 99 mg/dL Final   Glucose reference range applies only to samples taken after fasting for at least 8 hours.   BUN 08/30/2021 12  6 - 20 mg/dL Final   Creatinine, Ser 08/30/2021 0.75  0.44 - 1.00 mg/dL Final   Calcium 87/56/433208/06/2021 9.1  8.9 - 10.3 mg/dL Final   Phosphorus 95/18/841608/06/2021 2.7  2.5 - 4.6 mg/dL Final   Albumin 60/63/016008/06/2021 4.5  3.5 - 5.0 g/dL Final   GFR, Estimated 08/30/2021 >60  >60 mL/min Final   Comment: (NOTE) Calculated using the  CKD-EPI Creatinine Equation (2021)    Anion gap 08/30/2021 12  5 - 15 Final   Performed at [facility], [place] [address], [place],  [address]    Allergies: Penicillins  Medications:  PTA Medications  Medication Sig   ARIPiprazole (ABILIFY) 5 MG tablet Take 1 tablet (5 mg total) by mouth daily.   valACYclovir (VALTREX) 500 MG tablet Take 1 tablet (500 mg total) by mouth 2 (two) times daily. Take for 3 days for each outbreak.   clindamycin (CLEOCIN) 300 MG capsule Take 1 capsule (300 mg total) by mouth 3 (three) times daily.   metroNIDAZOLE  (FLAGYL) 500 MG tablet Take 1 tablet (500 mg total) by mouth 2 (two) times daily. Take for 7 days after period is over months.   Facility Ordered Medications  Medication   acetaminophen (TYLENOL) tablet 650 mg   alum & mag hydroxide-simeth (MAALOX/MYLANTA) 200-200-20 MG/5ML suspension 30 mL   magnesium hydroxide (MILK OF MAGNESIA) suspension 30 mL   traZODone (DESYREL) tablet 50 mg   dicyclomine (BENTYL) tablet 20 mg   hydrOXYzine (ATARAX) tablet 25 mg   methocarbamol (ROBAXIN) tablet 500 mg   naproxen (NAPROSYN) tablet 500 mg   cloNIDine (CATAPRES) tablet 0.1 mg   Followed by   Derrill Memo ON 03/04/2022] cloNIDine (CATAPRES) tablet 0.1 mg   Followed by   Derrill Memo ON 03/06/2022] cloNIDine (CATAPRES) tablet 0.1 mg   thiamine (VITAMIN B1) injection 100 mg   [START ON 03/02/2022] thiamine (VITAMIN B1) tablet 100 mg   multivitamin with minerals tablet 1 tablet   LORazepam (ATIVAN) tablet 1 mg   loperamide (IMODIUM) capsule 2-4 mg   ondansetron (ZOFRAN-ODT) disintegrating tablet 4 mg    Long Term Goals: Improvement in symptoms so as ready for discharge  Short Term Goals: Patient will verbalize feelings in meetings with treatment team members., Patient will attend at least of 50% of the groups daily., Pt will complete the PHQ9 on admission, day 3 and discharge., Patient will participate in completing the [place], Patient will score a low risk of violence for 24 hours prior to discharge, and Patient will take medications as prescribed daily.   Medical Decision Making  Patient presents to [facility] requesting opioid detox.  She meets criteria for treatment [facility].  There is no bed availability she will be admitted to the continuous assessment unit while awaiting availability in the [facility].    Recommendations  Based on my evaluation the patient does not appear to have an emergency medical condition.  She meets criteria for treatment FBC.  - she is seeking  opioid detox and  residential substance abuse treatment.   CIWA protocol for etoh and benzodiazapine abuse COWS with clonidine taper (BP 127/94) for opioid abuse/dependence.  Patient was previously prescribed clindamycin 300 mg 3 times daily for a boil in December 26, 2021. Ordered topical bacitracin tid and oral clindamycin 300 mg 3 times daily for 7 days for infected area on wrist.   Lab Orders         Resp panel by RT-PCR (RSV, Flu A&B, Covid) Anterior Nasal Swab         CBC with Differential/Platelet         Comprehensive metabolic panel         Hemoglobin A1c         Magnesium         Ethanol         Lipid panel  TSH         RPR         Urinalysis, Complete w Microscopic -Urine, Clean Catch         HIV Antibody (routine testing w rflx)         POCT Urine Drug Screen - (I-Screen)         POC urine preg, ED      EKG   Ardis Hughs, NP 03/01/22  3:45 PM

## 2022-03-01 NOTE — ED Notes (Signed)
Pt arrived [facility] Unit 

## 2022-03-01 NOTE — ED Notes (Signed)
Patient A&Ox4. Patient present to [facility] with Substance use disorder. Patient denies SI/HI and AVH. Patient denies any physical complaints when asked. No acute distress noted. Support and encouragement provided. Routine safety checks conducted according to facility protocol. Encouraged patient to notify staff if thoughts of harm toward self or others arise. Patient verbalize understanding and agreement. Will continue to monitor for safety.    

## 2022-03-01 NOTE — ED Notes (Signed)
Pt needs new blood sample for A1C and CBC because sample was clotted according to lab.

## 2022-03-01 NOTE — ED Notes (Signed)
Snacks given 
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
## 2022-03-01 NOTE — ED Notes (Signed)
Pt is sitting at the hallway reading a book. Respirations are even and unlabored. No acute distress noted. Will continue to monitor for safety.

## 2022-03-01 NOTE — Progress Notes (Signed)
   03/01/22 1449  [place] (Walk-ins at [facility] only)  How Did You Hear About [place]? Self  What Is the Reason for Your Visit/Call Today? Patient is a 37 y.o. female with a history of Opioid Use Disorder who presents requesting detox and referral to [facility] for Residential SA treatment following detox.  Patient states she spoke wiht Kiara with [facility] and was referred here.  Patient reprots multiple stressors, most pressing being losing her husband to an overdose last year.  Following this loss, his parents, who are closely connected with the DA's office in [place]  They were fighting for custody of patient's 71 y.o. son.  She reports she was arrested last February for abduction and kidnapping. She was able bond out and pays her own ankle monitoring fees.  Patient states she has been struggling and relapsed in November 2023 and has been using 3/5 g of fentanyl per two days since.  She states she has been using IV until recently, when she began to struggle to find a vein.  Last use was 1 hour PTA, approximately 0.3 g that she states she was able to inject IV.  Patient has been in contact with her P.O. and her monitoring service to inform them that she is seeking admisison for detox and residential treatment.  Patient reports hx of C-PTSD, anxiety and ADD.  She is engaged in outpatient therapy weekly.  Patient denies SI, HI and AVH.  How Long Has This Been Causing You Problems? > than 6 months  Have You Recently Had Any Thoughts About Hurting Yourself? No  Are You Planning to Commit Suicide/Harm Yourself At This time? No  Have you Recently Had Thoughts About [place]? No  Are You Planning To Harm Someone At This Time? No  Are you currently experiencing any auditory, visual or other hallucinations? No  Have You Used Any Alcohol or Drugs in the Past 24 Hours? Yes  How long ago did you use Drugs or Alcohol? 1 hour PTA  What Did You Use and How Much? 0.3 g of Fentanyl IV  Do you have any  current medical co-morbidities that require immediate attention? No  Clinician description of patient physical appearance/behavior: Patient appears anxious, is hyperverbal, pleasant and cooperative AAOx5  What Do You Feel Would Help You the Most Today? Alcohol or Drug Use Treatment  If access to [facility] was not available, would you have sought care in the Emergency Department? Yes  Determination of Need Urgent (48 hours)  Options For Referral Facility-Based Crisis;[facility] Urgent Care

## 2022-03-02 ENCOUNTER — Encounter (HOSPITAL_COMMUNITY): Payer: Self-pay

## 2022-03-02 ENCOUNTER — Encounter (HOSPITAL_COMMUNITY): Payer: Self-pay | Admitting: Psychiatry

## 2022-03-02 DIAGNOSIS — F1410 Cocaine abuse, uncomplicated: Secondary | ICD-10-CM

## 2022-03-02 DIAGNOSIS — F319 Bipolar disorder, unspecified: Secondary | ICD-10-CM | POA: Diagnosis not present

## 2022-03-02 DIAGNOSIS — Z1152 Encounter for screening for COVID-19: Secondary | ICD-10-CM | POA: Diagnosis not present

## 2022-03-02 DIAGNOSIS — F1910 Other psychoactive substance abuse, uncomplicated: Secondary | ICD-10-CM | POA: Diagnosis not present

## 2022-03-02 DIAGNOSIS — F411 Generalized anxiety disorder: Secondary | ICD-10-CM | POA: Insufficient documentation

## 2022-03-02 DIAGNOSIS — F332 Major depressive disorder, recurrent severe without psychotic features: Secondary | ICD-10-CM | POA: Insufficient documentation

## 2022-03-02 DIAGNOSIS — F1124 Opioid dependence with opioid-induced mood disorder: Secondary | ICD-10-CM | POA: Diagnosis not present

## 2022-03-02 LAB — GC/CHLAMYDIA PROBE AMP (~~LOC~~) NOT AT ARMC
Chlamydia: NEGATIVE
Comment: NEGATIVE
Comment: NORMAL
Neisseria Gonorrhea: NEGATIVE

## 2022-03-02 LAB — RPR: RPR Ser Ql: NONREACTIVE

## 2022-03-02 MED ORDER — MELATONIN 5 MG PO TABS
5.0000 mg | ORAL_TABLET | Freq: Every day | ORAL | Status: DC
  Administered 2022-03-02: 5 mg via ORAL
  Filled 2022-03-02: qty 1

## 2022-03-02 MED ORDER — PRAZOSIN HCL 1 MG PO CAPS
1.0000 mg | ORAL_CAPSULE | Freq: Every day | ORAL | Status: DC
  Administered 2022-03-02: 1 mg via ORAL
  Filled 2022-03-02: qty 1

## 2022-03-02 NOTE — BH IP Treatment Plan (Signed)
Interdisciplinary Treatment and Diagnostic Plan Update  03/02/2022 Time of Session: 2:02RK [name] MRN: [id number]  Diagnosis:  Final diagnoses:  Polysubstance abuse ([place])  PTSD (post-traumatic stress disorder)  Alcohol use disorder, severe, dependence (HCC)  Opioid dependence with opioid-induced mood disorder (HCC)  Benzodiazepine abuse ([place])  Cocaine abuse ([place])  GAD (generalized anxiety disorder)     Current Medications:  Current Facility-Administered Medications  Medication Dose Route Frequency Provider Last Rate Last Admin   acetaminophen (TYLENOL) tablet 650 mg  650 mg Oral Q6H PRN Revonda Humphrey, NP       alum & mag hydroxide-simeth (MAALOX/MYLANTA) 200-200-20 MG/5ML suspension 30 mL  30 mL Oral Q4H PRN Revonda Humphrey, NP       clindamycin (CLEOCIN) capsule 300 mg  300 mg Oral TID Revonda Humphrey, NP   300 mg at 03/02/22 8315   dicyclomine (BENTYL) tablet 20 mg  20 mg Oral Q6H PRN Revonda Humphrey, NP       hydrOXYzine (ATARAX) tablet 25 mg  25 mg Oral Q6H PRN Revonda Humphrey, NP       loperamide (IMODIUM) capsule 2-4 mg  2-4 mg Oral PRN Revonda Humphrey, NP       LORazepam (ATIVAN) tablet 1 mg  1 mg Oral Q6H PRN Revonda Humphrey, NP       magnesium hydroxide (MILK OF MAGNESIA) suspension 30 mL  30 mL Oral Daily PRN Revonda Humphrey, NP       melatonin tablet 5 mg  5 mg Oral QHS France Ravens, MD       methocarbamol (ROBAXIN) tablet 500 mg  500 mg Oral Q8H PRN Revonda Humphrey, NP       multivitamin with minerals tablet 1 tablet  1 tablet Oral Daily Revonda Humphrey, NP   1 tablet at 03/02/22 1761   naproxen (NAPROSYN) tablet 500 mg  500 mg Oral BID PRN Revonda Humphrey, NP       neomycin-bacitracin-polymyxin [phone] OINT 1 Application  1 Application Topical TID Revonda Humphrey, NP   1 Application at 69/48/54 0821   ondansetron (ZOFRAN-ODT) disintegrating tablet 4 mg  4 mg Oral Q6H PRN Revonda Humphrey, NP       prazosin  (MINIPRESS) capsule 1 mg  1 mg Oral QHS France Ravens, MD       saccharomyces boulardii (FLORASTOR) capsule 500 mg  500 mg Oral Daily Thomes Lolling H, NP   500 mg at 03/02/22 6270   thiamine (VITAMIN B1) tablet 100 mg  100 mg Oral Daily Revonda Humphrey, NP   100 mg at 03/02/22 3500   traZODone (DESYREL) tablet 50 mg  50 mg Oral QHS PRN Revonda Humphrey, NP       No current outpatient medications on file.   PTA Medications: Prior to Admission medications   Medication Sig Start Date End Date Taking? Authorizing Provider  gabapentin (NEURONTIN) 300 MG capsule Take 1 capsule (300 mg total) by mouth 3 (three) times daily. Agitation 09/14/17 04/26/18  Lindell Spar I, NP  traZODone (DESYREL) 100 MG tablet Take 1 tablet (100 mg total) by mouth at bedtime as needed for sleep. 09/14/17 04/26/18  Encarnacion Slates, NP    Patient Stressors: Legal issue   Substance abuse    Patient Strengths: Ability for insight  Average or above average intelligence  Capable of independent living  Communication skills  General fund of knowledge  Motivation for treatment/growth   Treatment Modalities: Medication  Management, Group therapy, Case management,  1 to 1 session with clinician, Psychoeducation, Recreational therapy.   Physician Treatment Plan for Primary and Secondary Diagnosis:  Final diagnoses:  Polysubstance abuse ([place])  PTSD (post-traumatic stress disorder)  Alcohol use disorder, severe, dependence (HCC)  Opioid dependence with opioid-induced mood disorder (HCC)  Benzodiazepine abuse (HCC)  Cocaine abuse ([place])  GAD (generalized anxiety disorder)   Long Term Goal(s):    Short Term Goals:    Medication Management: Evaluate patient's response, side effects, and tolerance of medication regimen.  Therapeutic Interventions: 1 to 1 sessions, Unit Group sessions and Medication administration.  Evaluation of Outcomes: Progressing  LCSW Treatment Plan for Primary Diagnosis:  Final diagnoses:   Polysubstance abuse ([place])  PTSD (post-traumatic stress disorder)  Alcohol use disorder, severe, dependence (HCC)  Opioid dependence with opioid-induced mood disorder (HCC)  Benzodiazepine abuse (HCC)  Cocaine abuse ([place])  GAD (generalized anxiety disorder)    Long Term Goal(s): Safe transition to appropriate next level of care at discharge.  Short Term Goals: Facilitate acceptance of mental health diagnosis and concerns through verbal commitment to aftercare plan and appointments at discharge., Patient will identify one social support prior to discharge to aid in patient's recovery., Patient will attend AA/NA groups as scheduled., Identify minimum of 2 triggers associated with mental health/substance abuse issues with treatment team members., and Increase skills for wellness and recovery by attending 50% of scheduled groups.  Therapeutic Interventions: Assess for all discharge needs, 1 to 1 time with Education officer, museum, Explore available resources and support systems, Assess for adequacy in community support network, Educate family and significant other(s) on suicide prevention, Complete Psychosocial Assessment, Interpersonal group therapy.  Evaluation of Outcomes: Progressing   Progress in Treatment: Attending groups: Yes. Participating in groups: Yes. Taking medication as prescribed: Yes. Toleration medication: Yes. Family/Significant other contact made: No, will contact:  patient declined collateral at this time.  Patient understands diagnosis: Yes. Discussing patient identified problems/goals with staff: Yes. Medical problems stabilized or resolved: Yes. Denies suicidal/homicidal ideation: Yes. Issues/concerns per patient self-inventory: Yes. Other: substance use and need for residential treatment.   New problem(s) identified: No, Describe:  other than reported on admission.   New Short Term/Long Term Goal(s): Safe transition to appropriate next level of care at discharge, Engage  patient in therapeutic group addressing interpersonal concerns. Engage patient in aftercare planning with referrals and resources, Increase ability to appropriately verbalize feelings, Facilitate acceptance of mental health diagnosis and concerns and Identify triggers associated with mental health/substance abuse issues.    Patient Goals: Patient is seeking residential placement at this time for substance use.   Discharge Plan or Barriers: LCSW will send referrals out for review for residential placement. Updates will be provided as received.   Reason for Continuation of Hospitalization: Withdrawal symptoms  Estimated Length of Stay: 3-5 days  Last 3 [place] Suicide Severity Risk Score: [place] ED from 03/01/2022 in [facility] ED to Hosp-Admission (Discharged) from 08/30/2021 in [place] ED from 05/07/2021 in [facility] Emergency Department at [place] No Risk High Risk No Risk       Last PHQ 2/9 Scores:    03/01/2022    6:50 PM 07/21/2021    2:29 PM 07/19/2019    2:16 PM  Depression screen PHQ 2/9  Decreased Interest 0 0 0  Down, Depressed, Hopeless 1 0 0  PHQ - 2 Score 1 0 0  Altered  sleeping 3  1  Tired, decreased energy 1  2  Change in appetite 2  3  Feeling bad or failure about yourself  1  1  Trouble concentrating 2  0  Moving slowly or fidgety/restless 0  0  Suicidal thoughts 0  0  PHQ-9 Score 10  7  Difficult doing work/chores Somewhat difficult  Not difficult at all    Scribe for Treatment Team: Gigi Gin 03/02/2022 11:31 AM

## 2022-03-02 NOTE — ED Notes (Addendum)
Patient A&Ox4. Denies intent to harm self/others when asked. Denies A/VH. Patient denies any physical complaints when asked. Pt states, "I'm sad and crying for I don't know what. I never cry and I don't usually cry when I'm going thru detox. Right now, I'm not detoxing at all. It usually don't start until around day 3-4. But I feel good right now". Support and encouragement provided. Routine safety checks conducted according to facility protocol. Encouraged patient to notify staff if thoughts of harm toward self or others arise. Patient verbalize understanding and agreement. Will continue to monitor for safety.

## 2022-03-02 NOTE — ED Notes (Signed)
Pt sitting in dayroom interacting with peers. No acute distress noted. No concerns voiced. Informed pt to notify staff with any needs or assistance. Pt verbalized understanding or agreement. Will continue to monitor for safety. 

## 2022-03-02 NOTE — ED Notes (Signed)
Patient observed/assessed in room in bed appearing in no immediate distress resting peacefully. Q15 minute checks continued by MHT and nursing staff. Will continue to monitor and support. 

## 2022-03-02 NOTE — ED Notes (Signed)
Pt is in the bed sleeping. Respirations are even and unlabored. No acute distress noted. Will continue to monitor for safety. 

## 2022-03-02 NOTE — ED Provider Notes (Signed)
FBC Progress Note  Date and Time: 03/02/2022 06:30 AM Name: [name] MRN:  [id number]  Reason For Admission: [name] is a 37 yo female w/ hx of substance induced psychosis, opiate use disorder, nicotine use disorder, borderline personality disorder, PTSD, GAD presenting to [facility] for substance use treatment for cocaine, methamphetamine, fentanyl, heroin, and xanax.   Subjective:   Seen and assessed in dayroom. She paces and admits to feeling "manic". She reports some racing thoughts and euphoria. She states she becomes "manic" when she is on substances. She states she is feeling tearful and anxious at this time. She endorses withdrawal symptoms  She states that her mood is stable when she is not on substances. She states she is seeking residential substance use treatment. She denies present SI/HI/AVH. She is resistant to starting any scheduled psychotropic medication at this time. She feels the psychotropic medications she has been on in the past have not been helpful. She was amenable to restarting prazosin and melatonin for her PTSD but did not want to be started on a SSRI at this time.   Diagnosis:  Final diagnoses:  Polysubstance abuse ([place])  PTSD (post-traumatic stress disorder)  Alcohol use disorder, severe, dependence (HCC)  Opioid dependence with opioid-induced mood disorder (HCC)  Benzodiazepine abuse ([place])  Cocaine abuse ([place])  GAD (generalized anxiety disorder)    Total Time spent with patient: 45 minutes   Labs  Lab Results:     Latest Ref Rng & Units 08/31/2021    4:34 AM 08/30/2021    9:10 AM 02/03/2020   12:44 AM  CBC  WBC 4.0 - 10.5 K/uL 12.8  12.1  17.6   Hemoglobin 12.0 - 15.0 g/dL 12.8  16.0  10.7   Hematocrit 36.0 - 46.0 % 38.5  46.1  31.6   Platelets 150 - 400 K/uL 253  380  358       Latest Ref Rng & Units 03/01/2022    5:16 PM 08/31/2021    4:34 AM 08/30/2021    6:36 PM  CMP  Glucose 70 - 99 mg/dL 53  101  102   BUN 6 - 20 mg/dL 11  11  12     Creatinine 0.44 - 1.00 mg/dL 0.83  0.78  0.75   Sodium 135 - 145 mmol/L 138  140  142   Potassium 3.5 - 5.1 mmol/L 3.7  3.4  3.5   Chloride 98 - 111 mmol/L 97  109  105   CO2 22 - 32 mmol/L 24  23  25    Calcium 8.9 - 10.3 mg/dL 10.5  8.9  9.1   Total Protein 6.5 - 8.1 g/dL 9.0  7.2    Total Bilirubin 0.3 - 1.2 mg/dL 1.5  1.0    Alkaline Phos 38 - 126 U/L 82  52    AST 15 - 41 U/L 27  12    ALT 0 - 44 U/L 11  9      Physical Findings   AIMS    Flowsheet Row Admission (Discharged) from 09/11/2017 in [place] [facility]  AIMS Total Score 0      AUDIT    Flowsheet Row Admission (Discharged) from 09/11/2017 in [place] [facility]  Alcohol Use Disorder Identification Test Final Score (AUDIT) 1      PHQ2-9    Flowsheet Row ED from 03/01/2022 in [facility] Office Visit from 07/21/2021 in [facility] for [place] at [place]  Clinical Support from 07/19/2019 in [facility] for [place] at [facility] Total Score 1 0 0  PHQ-9 Total Score 10 -- 7      Flowsheet Row ED from 03/01/2022 in [facility] ED to Hosp-Admission (Discharged) from 08/30/2021 in [place] ED from 05/07/2021 in [facility] Emergency Department at [place] No Risk High Risk No Risk        Musculoskeletal  Strength & Muscle Tone: within normal limits Gait & Station: normal Patient leans: N/A  Psychiatric Specialty Exam  Presentation  General Appearance:  Casual; Disheveled   Eye Contact: Good   Speech: Normal Rate; Clear and Coherent   Speech Volume: Normal   Handedness: Right    Mood and Affect  Mood: Euphoric; Labile   Affect: Labile; Tearful    Thought Process  Thought Processes: Disorganized   Descriptions of Associations:Circumstantial   Orientation:Full  (Time, Place and Person)   Thought Content:Scattered   Diagnosis of Schizophrenia or Schizoaffective disorder in past: No     Hallucinations:Hallucinations: None   Ideas of Reference:None   Suicidal Thoughts:Suicidal Thoughts: No   Homicidal Thoughts:Homicidal Thoughts: No    Sensorium  Memory: Immediate Good; Recent Good; Remote Good   Judgment: Fair   Insight: Fair    Materials engineer: Fair   Attention Span: Fair   Recall: [employer] of Knowledge: Fair   Language: Fair    Psychomotor Activity  Psychomotor Activity: Psychomotor Activity: Increased    Assets  Assets: Communication Skills; Desire for Improvement; Financial Resources/Insurance; Physical Health; Resilience    Sleep  Sleep: Sleep: Poor Number of Hours of Sleep: 4    Physical Exam  Physical Exam Vitals and nursing note reviewed.  Constitutional:      General: She is not in acute distress.    Appearance: Normal appearance. She is well-developed. She is toxic-appearing and diaphoretic.  HENT:     Head: Normocephalic and atraumatic.  Eyes:     Conjunctiva/sclera: Conjunctivae normal.  Cardiovascular:     Rate and Rhythm: Normal rate and regular rhythm.     Heart sounds: No murmur heard. Pulmonary:     Effort: Pulmonary effort is normal. No respiratory distress.     Breath sounds: Normal breath sounds.  Abdominal:     Palpations: Abdomen is soft.     Tenderness: There is no abdominal tenderness.  Musculoskeletal:        General: No swelling.     Cervical back: Neck supple.  Skin:    General: Skin is warm.     Capillary Refill: Capillary refill takes less than 2 seconds.     Comments: Significant needle track marks throughout arms and legs. Mild erythema and pain on palpation.   Neurological:     Mental Status: She is alert and oriented to person, place, and time.  Psychiatric:        Mood and Affect: Mood normal.    Review of Systems   Respiratory:  Negative for shortness of breath.   Cardiovascular:  Negative for chest pain.  Gastrointestinal:  Negative for abdominal pain, constipation, diarrhea, heartburn, nausea and vomiting.  Neurological:  Negative for headaches.   Blood pressure 120/89, pulse 82, temperature 97.8 F (36.6 C), temperature source Oral, resp. rate 18, SpO2 100 %. There is no height or weight on file to calculate BMI.  ASSESSMENT [name] is a 37  yo female w/ hx of substance induced psychosis, opiate use disorder, nicotine use disorder, borderline personality disorder, PTSD, GAD presenting to [facility] for substance use treatment for cocaine, methamphetamine, fentanyl, heroin, and xanax.  STI panel negative. CMP showed hemolysis. Repeating tomorrow. Started on prazosin and melatonin for sleep. Somewhat disorganized and hypomanic but likely substance induced. Will monitor during hospitalization.  PLAN Opiate Use Disorder Nicotine Use Disorder Stimulant use disorder Sedative Use Disorder Alcohol abuse -COWS -CIWA -Opiate withdrawal PRNs  PTSD GAD -Start prazosin 1 mg qhs for PTSD  -monitor for hypotension -Continue hydroxyzine 25 mg tid prn for anxiety -Assess willingness to start SSRI/SNRI  Abnormal CMP Hemolysis noted on CMP so will repeat tomorrow -Encourage fluid hydration  Dispo: residential treatment   France Ravens, MD 03/02/2022 10:57 AM

## 2022-03-02 NOTE — ED Notes (Signed)
Patient attended group titled What is Addiction, we covered loss of control, physical effects, risky use,social/occupational problems, we talked about how addiction is a disease and how it changes the way you think and behave. You have to have some measure of control over yourself in order to defeat the disease, you cannot let it take over your life. We discussed relapses that can happen, but they are not the end, you have to pick yourself up and continue on, find someone or something to keep your self busy, play a sport, watch tv, read a book, go for a walk, exercise, call a friend, play with a pet. We discussed making new relationships and keeping the ones that support you that are positive and want you to get better. Get enough sleep take your medications and see your therapist and doctors regularly. There are worksheets given also for the patient to work on in their free time. The patients enjoyed the group and participated thru out the session. 

## 2022-03-02 NOTE — ED Notes (Signed)
Pt admitted to [facility] requesting for detox from opoid. Patient was cooperative during the admission assessment. Skin assessment complete. Belongings in the locker. Patient oriented to unit and unit rules. Meal and drinks offered to patient.  Patient verbalized agreement to treatment plans. Patient verbally contracts for safety while hospitalized. Will monitor for safety.

## 2022-03-02 NOTE — ED Notes (Signed)
Pt is at nurses station talking to MHT. Respirations are even and unlabored. No acute distress noted. Will continue to monitor for safety.

## 2022-03-02 NOTE — Tx Team (Signed)
LCSW met with patient to assess current mood, affect, physical state, and inquire about needs/goals while here in [facility] and after discharge. Patient reports she presented due to needing to detox from substance use in order to go to [facility]. Patient reports she has been in contact with Enis Gash at [facility] regarding admission and was advised to come to the [facility] to detox first. Patient reports he has been using fentanyl and heroin daily. Patient reports she uses on average about 1.7 grams of fentanyl each day Patient reports first use was in 2010, and reports on and off use since then. Patient reports a 4 year period of sobriety and reports NA and medications helped with staying sober. Patient recalls "getting wasted on Saturday, doing crystal meth Sunday, having a seizure apparently, and then throwing up all over myself". Patient reports no current withdrawal symptoms stating "My symptoms come about 2-3 days after use". Patient reports she is currently dealing with some legal issues at this time. Patient reports her lawyer is handling everything and has advised her to seek treatment at this time. Patient reports her charges are for kidnapping and abduction. Patient provided permission for team to speak with her attorney as needed: Jola Schmidt [phone]. Patient reports an upcoming court date of April 21, 2022 that she does not have to appear to. Patient reports she lives at home with her 34 year old son that she has custody over. Patient reports she has three other children as well ages 69, 46, and 2, however reports she does not have custody of the 39 year old. Patient reports the child's father is currently caring for the child in the home while she seeks treatment. Patient reports she is currently seen by a Therapist name Edd Arbour at [place]. Patient reports she is seen weekly for individual counseling and twice a week for group therapy. Patient denies taking any medications at this time. Patient  reports her current goal is to seek residential placement at [facility] for substance use. Patient currently denies any SI/HI/AVH and reports mood as good. Patient aware that LCSW will send referral out for review and will follow up to provide updates as received. Patient expressed understanding and appreciation of LCSW assistance. No other needs were reported at this time by patient.   Referral has been sent to [facility] for review. Per Enis Gash, once reviewed she will follow up with LCSW to provide update. LCSW will continue to follow and provide support to patient while on [facility] unit.   Lucius Conn, LCSW Clinical Social Worker [place] [facility] Ph: [phone]

## 2022-03-03 ENCOUNTER — Other Ambulatory Visit: Payer: Self-pay

## 2022-03-03 ENCOUNTER — Ambulatory Visit (INDEPENDENT_AMBULATORY_CARE_PROVIDER_SITE_OTHER)
Admission: EM | Admit: 2022-03-03 | Discharge: 2022-03-04 | Disposition: A | Discharge status: Home / Self Care | Payer: Medicaid Other | Source: Home / Self Care

## 2022-03-03 ENCOUNTER — Emergency Department (HOSPITAL_COMMUNITY)
Admission: EM | Admit: 2022-03-03 | Discharge: 2022-03-03 | Disposition: A | Discharge status: Home / Self Care | Payer: Medicaid Other | Attending: Emergency Medicine | Admitting: Emergency Medicine

## 2022-03-03 ENCOUNTER — Encounter (HOSPITAL_COMMUNITY): Payer: Self-pay

## 2022-03-03 DIAGNOSIS — F1124 Opioid dependence with opioid-induced mood disorder: Secondary | ICD-10-CM | POA: Diagnosis not present

## 2022-03-03 DIAGNOSIS — F1910 Other psychoactive substance abuse, uncomplicated: Secondary | ICD-10-CM | POA: Diagnosis not present

## 2022-03-03 DIAGNOSIS — L03114 Cellulitis of left upper limb: Secondary | ICD-10-CM

## 2022-03-03 DIAGNOSIS — F319 Bipolar disorder, unspecified: Secondary | ICD-10-CM | POA: Diagnosis not present

## 2022-03-03 DIAGNOSIS — F1911 Other psychoactive substance abuse, in remission: Secondary | ICD-10-CM | POA: Insufficient documentation

## 2022-03-03 DIAGNOSIS — F1110 Opioid abuse, uncomplicated: Secondary | ICD-10-CM

## 2022-03-03 DIAGNOSIS — Z1152 Encounter for screening for COVID-19: Secondary | ICD-10-CM | POA: Insufficient documentation

## 2022-03-03 DIAGNOSIS — Z765 Malingerer [conscious simulation]: Secondary | ICD-10-CM | POA: Insufficient documentation

## 2022-03-03 LAB — POCT URINE DRUG SCREEN - MANUAL ENTRY (I-SCREEN)
POC Amphetamine UR: POSITIVE — AB
POC Buprenorphine (BUP): NOT DETECTED
POC Cocaine UR: NOT DETECTED
POC Marijuana UR: POSITIVE — AB
POC Methadone UR: NOT DETECTED
POC Methamphetamine UR: POSITIVE — AB
POC Morphine: POSITIVE — AB
POC Oxazepam (BZO): POSITIVE — AB
POC Oxycodone UR: NOT DETECTED
POC Secobarbital (BAR): NOT DETECTED

## 2022-03-03 LAB — POC SARS CORONAVIRUS 2 AG: SARSCOV2ONAVIRUS 2 AG: NEGATIVE

## 2022-03-03 MED ORDER — OLANZAPINE 10 MG PO TBDP: 10.0000 mg | ORAL_TABLET | Freq: Three times a day (TID) | ORAL | Status: DC | PRN

## 2022-03-03 MED ORDER — LORAZEPAM 1 MG PO TABS: 1.0000 mg | ORAL_TABLET | ORAL | Status: DC | PRN

## 2022-03-03 MED ORDER — MAGNESIUM HYDROXIDE 400 MG/5ML PO SUSP: 30.0000 mL | Freq: Every day | ORAL | Status: DC | PRN

## 2022-03-03 MED ORDER — MUPIROCIN CALCIUM 2 % EX CREA: 1.0000 | TOPICAL_CREAM | Freq: Two times a day (BID) | CUTANEOUS | 0 refills | Status: AC

## 2022-03-03 MED ORDER — ZIPRASIDONE MESYLATE 20 MG IM SOLR: 20.0000 mg | INTRAMUSCULAR | Status: DC | PRN

## 2022-03-03 MED ORDER — ACETAMINOPHEN 325 MG PO TABS: 650.0000 mg | ORAL_TABLET | Freq: Four times a day (QID) | ORAL | Status: DC | PRN

## 2022-03-03 MED ORDER — CLINDAMYCIN HCL 150 MG PO CAPS: 300.0000 mg | ORAL_CAPSULE | Freq: Four times a day (QID) | ORAL | 0 refills | Status: AC

## 2022-03-03 MED ORDER — ALUM & MAG HYDROXIDE-SIMETH 200-200-20 MG/5ML PO SUSP: 30.0000 mL | ORAL | Status: DC | PRN

## 2022-03-03 MED ORDER — LORAZEPAM 1 MG PO TABS
1.0000 mg | ORAL_TABLET | Freq: Two times a day (BID) | ORAL | Status: DC
  Administered 2022-03-03: 1 mg via ORAL
  Filled 2022-03-03: qty 1

## 2022-03-03 NOTE — ED Notes (Signed)
Patient A&Ox4. Denies intent to harm self/others when asked. Denies A/VH. Patient c/o numbness and aches to L foot and L arm from IV drug use. Assessment performed from MD. Pt c/o increased anxiety from withdrawal sx. Ativan ordered and administered. Pt tolerated well. Support and encouragement provided. Routine safety checks conducted according to facility protocol. Encouraged patient to notify staff if thoughts of harm toward self or others arise. Patient verbalize understanding and agreement. Will continue to monitor for safety.

## 2022-03-03 NOTE — Discharge Instructions (Signed)
List of Residential placements:   [facility]: [phone]  [facility]: [place], [place] [phone]: Female and female facility; 30-day program: (uninsured and Medicaid such as [facility], [facility], [facility], [facility])  [facility]: [phone]; men and women's facility; 28 days; Can have Medicaid tailored plan ([facility] or [facility])  [facility]: [phone] Angie or Lynn; 28 day program; must be fully detox; tailored Medicaid or no insurance  [facility] in [place], [place]; [phone]; 28 day all males program; no insurance accepted  [facility] in [place]: [place] [phone] (no insurance or Medicaid only); 90 days; outpatient services but provide housing in apartments [place]  [facility] Admission: [phone]: Patient must complete phone screening for placement: [place], [place]; 6 month program; uninsured, Medicaid, and [facility] insurance.   [facility]: no insurance required; [phone]  [facility] [facility]: [phone]; Intake: Robert; Must fill out application online; Victor Delay [phone] x [phone]  [facility] in [place], [place]: [phone]; Admissions Coordinators Mr. Dennis or David Gibson; 90 day program.  [facility]: [place], [place] [phone]; Co-Ed 9 month to a year program; Online application; Men entry fee is $500 (6-12months);  [facility]: [address] [place], [place] [address]; no fee or insurance required; minimum of 2 years; Highly structured; work based; Intake Coordinator is Chris [phone]  [facility] in [place], [place]: [phone]; Fax number is [phone]; website: [facility]org; Requires 3-6 page autobiography; 2 year program (18 months and then 6month transitional housing); Admission fee is $300; no insurance needed; work program  [facility] in [place], [place]: Front Desk Staff: Reeci [phone]: They have a  Men's Regenerations Program 6-9months. Free program; There is an initial $300 fee however, they are willing to work with patients regarding that. Application is online.  [facility]: Admissions [phone] Benjamin Cox ext [phone]; Any 7-90 day program is out of pocket; 12 month program is free of charge; there is a $275 entry fee; Patient is responsible for own transportation   [facility] [address] [place], [place], [address] [phone] phone  New Patient Assessment/Therapy Walk-Ins:  Monday and Wednesday: 8 am until slots are full. Every 1st and 2nd Fridays of the month: 1 pm - 5 pm.  NO ASSESSMENT/THERAPY WALK-INS ON TUESDAYS OR THURSDAYS  New Patient Assessment/Medication Management Walk-Ins:  Monday - Friday:  8 am - 11 am.  For all walk-ins, we ask that you arrive by 7:30 am because patients will be seen in the order of arrival.  Availability is limited; therefore, you may not be seen on the same day that you walk-in.  Our goal is to serve and meet the needs of our community to the best of our ability.  SUBSTANCE USE TREATMENT for Medicaid and State Funded/IPRS  [facility]) [address] [place], [place], [address] [phone] phone NOTE: [facility] is no longer offering IOP services.  Serves those who are low-income or have no insurance.  [facility] [address], [place], [place], [address] [phone] phone [phone] fax NOTE: Does have Substance Abuse-Intensive Outpatient Program (SAIOP) as well as transitional housing if eligible.  [facility] [address] [place], [place], [address] [phone] phone [phone] fax  [facility] [address] [place], , [address] [phone] phone [phone] fax  HALFWAY HOUSES:  [facility] [phone]  [facility] www.oxford[facility].com  12 STEP PROGRAMS:  Alcoholics Anonymous of [place]  https://aagreensboronc.com/meeting  Narcotics Anonymous of [place] https://[email or web address]/[email or web address]    Al-Anon of [place], [place] www.greens[place]alanon.org/find-meetings.html  Nar-Anon https://nar-anon.org/find-a-meetin 

## 2022-03-03 NOTE — Discharge Planning (Signed)
Referral was received and per Enis Gash, patient has been accepted and can transfer to the facility on 02/13 by 9:00am. Update has been provided to the patient and MD made aware. Patient will need a 14-30 day supply of medication and one month refill. No nicotine gum allowed, however 14-30 day nicotine patches to be provided if needed. No other needs to report at this time.    LCSW will continue to follow up and provide updates as received.    Lucius Conn, LCSW Clinical Social Worker [facility] Ph: [phone]

## 2022-03-03 NOTE — ED Provider Notes (Signed)
[facility] Continuous Assessment Admission H&P  Date: 03/04/22 Patient Name: [name] MRN: [id number] Chief Complaint: detox from substance abuse  Diagnoses:  Final diagnoses:  Polysubstance abuse (HCC)  Opioid abuse (HCC)  Malingering    HPI: [name], 37 y/o female with a history of polysubstance abuse, PTSD, alcohol use disorder, opioid dependent with opioid-induced mood disorder cocaine abuse and general anxiety presented to [facility] voluntarily tonight after leaving AMA today.  Per the patient she was a little bit anxious today and that is why she left patient stated she wants to stay the time so that she can get into the programs that they already lined up for her.   Triage notes Pt presents to [facility] voluntarily, unaccompanied requesting substance abuse treatment/detox. Pt reports using Fentanyl daily for the past 20 years. Pt reports being clean for a short period of time last year and relapsed in November of 2023. Pt last used Fentanyl on Monday. Pt reports speaking to [facility] for Residential SA treatment following detox on Tuesday. Pt currently denies SI, HI, AVH   Face-to-face observation of patient, patient is alert and oriented x 4, speech is clear.  Maintain eye contact.  Patient is a little bit anxious, patient denies SI, HI, AVH or paranoia at this time. Per the patient she want to continue detox and that is why she came back to be in a safe place.   Recommend inpatient  until a be become available at [facility]  Total Time spent with patient: 20 minutes  Musculoskeletal  Strength & Muscle Tone: within normal limits Gait & Station: normal Patient leans: N/A  Psychiatric Specialty Exam  Presentation General Appearance:  Casual  Eye Contact: Good  Speech: Normal Rate  Speech Volume: Normal  Handedness: Right   Mood and Affect  Mood: Euphoric  Affect: Labile   Thought Process  Thought Processes: Coherent  Descriptions of  Associations:Circumstantial  Orientation:Full (Time, Place and Person)  Thought Content:WDL  Diagnosis of Schizophrenia or Schizoaffective disorder in past: No   Hallucinations:Hallucinations: None  Ideas of Reference:None  Suicidal Thoughts:Suicidal Thoughts: No  Homicidal Thoughts:Homicidal Thoughts: No   Sensorium  Memory: Immediate Fair  Judgment: Poor  Insight: Fair   Chartered certified accountant: Fair  Attention Span: Fair  Recall: [employer] of Knowledge: Fair  Language: Fair   Psychomotor Activity  Psychomotor Activity: Psychomotor Activity: Normal   Assets  Assets: Communication Skills; Resilience   Sleep  Sleep: Sleep: Poor Number of Hours of Sleep: 4   Nutritional Assessment (For OBS and FBC admissions only) Has the patient had a weight loss or gain of 10 pounds or more in the last 3 months?: Yes Has the patient had a decrease in food intake/or appetite?: Yes Does the patient have dental problems?: No Does the patient have eating habits or behaviors that may be indicators of an eating disorder including binging or inducing vomiting?: No Has the patient recently lost weight without trying?: 0 Has the patient been eating poorly because of a decreased appetite?: 0 Malnutrition Screening Tool Score: 0    Physical Exam HENT:     Head: Normocephalic.     Nose: Nose normal.  Cardiovascular:     Rate and Rhythm: Normal rate.  Pulmonary:     Effort: Pulmonary effort is normal.  Musculoskeletal:        General: Normal range of motion.     Cervical back: Normal range of motion.  Neurological:     General: No focal  deficit present.     Mental Status: She is alert.  Psychiatric:        Mood and Affect: Mood normal.        Behavior: Behavior normal.        Thought Content: Thought content normal.        Judgment: Judgment normal.    Review of Systems  Constitutional: Negative.   HENT: Negative.    Eyes: Negative.    Respiratory: Negative.    Cardiovascular: Negative.   Gastrointestinal: Negative.   Genitourinary: Negative.   Musculoskeletal: Negative.   Skin: Negative.   Neurological: Negative.   Psychiatric/Behavioral:  Positive for substance abuse. The patient is nervous/anxious.     Blood pressure (!) 136/98, pulse 79, temperature (!) 97.4 F (36.3 C), temperature source Oral, resp. rate 18, SpO2 100 %. There is no height or weight on file to calculate BMI.  Past Psychiatric History: polysubstance abuse,  cocaine abuse, GAD,    Is the patient at risk to self? No  Has the patient been a risk to self in the past 6 months? No .    Has the patient been a risk to self within the distant past? No   Is the patient a risk to others? No   Has the patient been a risk to others in the past 6 months? No   Has the patient been a risk to others within the distant past? No   Past Medical History: see chart  Family History: unknown  Social History: polysubstance use  Last Labs:  Admission on 03/03/2022  Component Date Value Ref Range Status   SARS Coronavirus 2 by RT PCR 03/03/2022 NEGATIVE  NEGATIVE Final   Influenza A by PCR 03/03/2022 NEGATIVE  NEGATIVE Final   Influenza B by PCR 03/03/2022 NEGATIVE  NEGATIVE Final   Comment: (NOTE) The Xpert Xpress SARS-CoV-2/FLU/RSV plus assay is intended as an aid in the diagnosis of influenza from Nasopharyngeal swab specimens and should not be used as a sole basis for treatment. Nasal washings and aspirates are unacceptable for Xpert Xpress SARS-CoV-2/FLU/RSV testing.  Fact Sheet for Patients: EntrepreneurPulse.com.au  Fact Sheet for Healthcare Providers: IncredibleEmployment.be  This test is not yet approved or cleared by the [place] FDA and has been authorized for detection and/or diagnosis of SARS-CoV-2 by FDA under an Emergency Use Authorization (EUA). This EUA will remain in effect (meaning this test  can be used) for the duration of the COVID-19 declaration under Section 564(b)(1) of the Act, 21 U.S.C. section 360bbb-3(b)(1), unless the authorization is terminated or revoked.     Resp Syncytial Virus by PCR 03/03/2022 NEGATIVE  NEGATIVE Final   Comment: (NOTE) Fact Sheet for Patients: EntrepreneurPulse.com.au  Fact Sheet for Healthcare Providers: IncredibleEmployment.be  This test is not yet approved or cleared by the [place] FDA and has been authorized for detection and/or diagnosis of SARS-CoV-2 by FDA under an Emergency Use Authorization (EUA). This EUA will remain in effect (meaning this test can be used) for the duration of the COVID-19 declaration under Section 564(b)(1) of the Act, 21 U.S.C. section 360bbb-3(b)(1), unless the authorization is terminated or revoked.  Performed at [facility], [place] [address], [place], [place] [address]    POC Amphetamine UR 03/03/2022 Positive (A)  NONE DETECTED (Cut Off Level 1000 ng/mL) Final   POC Secobarbital (BAR) 03/03/2022 None Detected  NONE DETECTED (Cut Off Level 300 ng/mL) Final   POC Buprenorphine (BUP) 03/03/2022 None Detected  NONE DETECTED (Cut Off  Level 10 ng/mL) Final   POC Oxazepam (BZO) 03/03/2022 Positive (A)  NONE DETECTED (Cut Off Level 300 ng/mL) Final   POC Cocaine UR 03/03/2022 None Detected  NONE DETECTED (Cut Off Level 300 ng/mL) Final   POC Methamphetamine UR 03/03/2022 Positive (A)  NONE DETECTED (Cut Off Level 1000 ng/mL) Final   POC Morphine 03/03/2022 Positive (A)  NONE DETECTED (Cut Off Level 300 ng/mL) Final   POC Methadone UR 03/03/2022 None Detected  NONE DETECTED (Cut Off Level 300 ng/mL) Final   POC Oxycodone UR 03/03/2022 None Detected  NONE DETECTED (Cut Off Level 100 ng/mL) Final   POC Marijuana UR 03/03/2022 Positive (A)  NONE DETECTED (Cut Off Level 50 ng/mL) Final   SARSCOV2ONAVIRUS 2 AG 03/03/2022 NEGATIVE  NEGATIVE Final   Comment:  (NOTE) SARS-CoV-2 antigen NOT DETECTED.   Negative results are presumptive.  Negative results do not preclude SARS-CoV-2 infection and should not be used as the sole basis for treatment or other patient management decisions, including infection  control decisions, particularly in the presence of clinical signs and  symptoms consistent with COVID-19, or in those who have been in contact with the virus.  Negative results must be combined with clinical observations, patient history, and epidemiological information. The expected result is Negative.  Fact Sheet for Patients: HandmadeRecipes.com.cy  Fact Sheet for Healthcare Providers: FuneralLife.at  This test is not yet approved or cleared by the [place] FDA and  has been authorized for detection and/or diagnosis of SARS-CoV-2 by FDA under an Emergency Use Authorization (EUA).  This EUA will remain in effect (meaning this test can be used) for the duration of  the COV                          ID-19 declaration under Section 564(b)(1) of the Act, 21 U.S.C. section 360bbb-3(b)(1), unless the authorization is terminated or revoked sooner.    Admission on 03/01/2022, Discharged on 03/03/2022  Component Date Value Ref Range Status   SARS Coronavirus 2 by RT PCR 03/01/2022 NEGATIVE  NEGATIVE Final   Influenza A by PCR 03/01/2022 NEGATIVE  NEGATIVE Final   Influenza B by PCR 03/01/2022 NEGATIVE  NEGATIVE Final   Comment: (NOTE) The Xpert Xpress SARS-CoV-2/FLU/RSV plus assay is intended as an aid in the diagnosis of influenza from Nasopharyngeal swab specimens and should not be used as a sole basis for treatment. Nasal washings and aspirates are unacceptable for Xpert Xpress SARS-CoV-2/FLU/RSV testing.  Fact Sheet for Patients: EntrepreneurPulse.com.au  Fact Sheet for Healthcare Providers: IncredibleEmployment.be  This test is not yet approved or  cleared by the [place] FDA and has been authorized for detection and/or diagnosis of SARS-CoV-2 by FDA under an Emergency Use Authorization (EUA). This EUA will remain in effect (meaning this test can be used) for the duration of the COVID-19 declaration under Section 564(b)(1) of the Act, 21 U.S.C. section 360bbb-3(b)(1), unless the authorization is terminated or revoked.     Resp Syncytial Virus by PCR 03/01/2022 NEGATIVE  NEGATIVE Final   Comment: (NOTE) Fact Sheet for Patients: EntrepreneurPulse.com.au  Fact Sheet for Healthcare Providers: IncredibleEmployment.be  This test is not yet approved or cleared by the [place] FDA and has been authorized for detection and/or diagnosis of SARS-CoV-2 by FDA under an Emergency Use Authorization (EUA). This EUA will remain in effect (meaning this test can be used) for the duration of the COVID-19 declaration under Section 564(b)(1) of the Act, 21 U.S.C. section 360bbb-3(b)(1),  unless the authorization is terminated or revoked.  Performed at [facility], [address] [place], [place] [address]    Sodium 03/01/2022 138  135 - 145 mmol/L Final   Potassium 03/01/2022 3.7  3.5 - 5.1 mmol/L Final   HEMOLYSIS AT THIS LEVEL MAY AFFECT RESULT   Chloride 03/01/2022 97 (L)  98 - 111 mmol/L Final   CO2 03/01/2022 24  22 - 32 mmol/L Final   Glucose, Bld 03/01/2022 53 (L)  70 - 99 mg/dL Final   Glucose reference range applies only to samples taken after fasting for at least 8 hours.   BUN 03/01/2022 11  6 - 20 mg/dL Final   Creatinine, Ser 03/01/2022 0.83  0.44 - 1.00 mg/dL Final   Calcium 07/25/1599 10.5 (H)  8.9 - 10.3 mg/dL Final   Total Protein 09/32/3557 9.0 (H)  6.5 - 8.1 g/dL Final   Albumin 32/20/2542 5.2 (H)  3.5 - 5.0 g/dL Final   AST 70/62/3762 27  15 - 41 U/L Final   HEMOLYSIS AT THIS LEVEL MAY AFFECT RESULT   ALT 03/01/2022 11  0 - 44 U/L Final   HEMOLYSIS AT THIS LEVEL MAY  AFFECT RESULT   Alkaline Phosphatase 03/01/2022 82  38 - 126 U/L Final   Total Bilirubin 03/01/2022 1.5 (H)  0.3 - 1.2 mg/dL Final   HEMOLYSIS AT THIS LEVEL MAY AFFECT RESULT   GFR, Estimated 03/01/2022 >60  >60 mL/min Final   Comment: (NOTE) Calculated using the CKD-EPI Creatinine Equation (2021)    Anion gap 03/01/2022 17 (H)  5 - 15 Final   Performed at [facility], [address], [place], [place] [address]   Magnesium 03/01/2022 2.2  1.7 - 2.4 mg/dL Final   Performed at [facility], [address] [place], [place] [address]   Alcohol, Ethyl (B) 03/01/2022 <10  <10 mg/dL Final   Comment: (NOTE) Lowest detectable limit for serum alcohol is 10 mg/dL.  For medical purposes only. Performed at [facility], [address], [place], [place] [address]    Cholesterol 03/01/2022 171  0 - 200 mg/dL Final   Triglycerides 62/69/4854 83  <150 mg/dL Final   HDL 62/70/3500 57  >40 mg/dL Final   Total CHOL/HDL Ratio 03/01/2022 3.0  RATIO Final   VLDL 03/01/2022 17  0 - 40 mg/dL Final   LDL Cholesterol 03/01/2022 97  0 - 99 mg/dL Final   Comment:        Total Cholesterol/HDL:CHD Risk Coronary Heart Disease Risk Table                     Men   Women  1/2 Average Risk   3.4   3.3  Average Risk       5.0   4.4  2 X Average Risk   9.6   7.1  3 X Average Risk  23.4   11.0        Use the calculated Patient Ratio above and the CHD Risk Table to determine the patient's CHD Risk.        ATP III CLASSIFICATION (LDL):  <100     mg/dL   Optimal  938-182  mg/dL   Near or Above                    Optimal  130-159  mg/dL   Borderline  993-716  mg/dL   High  >967  mg/dL   Very High Performed at [facility], [address], [place], [place] [address]    TSH 03/01/2022 3.388  0.350 - 4.500 uIU/mL Final   Comment: Performed by a 3rd Generation assay with a functional sensitivity of <=0.01 uIU/mL. Performed at [facility], [address], [place], [place]  [address]    Neisseria Gonorrhea 03/01/2022 Negative   Final   Chlamydia 03/01/2022 Negative   Final   Comment 03/01/2022 Normal Reference Ranger Chlamydia - Negative   Final   Comment 03/01/2022 Normal Reference Range Neisseria Gonorrhea - Negative   Final   RPR Ser Ql 03/01/2022 NON REACTIVE  NON REACTIVE Final   Performed at [facility], [address] [place], [place] [address]   POC Amphetamine UR 03/01/2022 Positive (A)  NONE DETECTED (Cut Off Level 1000 ng/mL) Final   POC Secobarbital (BAR) 03/01/2022 None Detected  NONE DETECTED (Cut Off Level 300 ng/mL) Final   POC Buprenorphine (BUP) 03/01/2022 None Detected  NONE DETECTED (Cut Off Level 10 ng/mL) Final   POC Oxazepam (BZO) 03/01/2022 None Detected  NONE DETECTED (Cut Off Level 300 ng/mL) Final   POC Cocaine UR 03/01/2022 Positive (A)  NONE DETECTED (Cut Off Level 300 ng/mL) Final   POC Methamphetamine UR 03/01/2022 Positive (A)  NONE DETECTED (Cut Off Level 1000 ng/mL) Final   POC Morphine 03/01/2022 Positive (A)  NONE DETECTED (Cut Off Level 300 ng/mL) Final   POC Methadone UR 03/01/2022 None Detected  NONE DETECTED (Cut Off Level 300 ng/mL) Final   POC Oxycodone UR 03/01/2022 None Detected (A)  NONE DETECTED (Cut Off Level 100 ng/mL) Final   POC Marijuana UR 03/01/2022 Positive (A)  NONE DETECTED (Cut Off Level 50 ng/mL) Final   Preg Test, Ur 03/01/2022 Negative  Negative Final   Color, Urine 03/01/2022 AMBER (A)  YELLOW Final   BIOCHEMICALS MAY BE AFFECTED BY COLOR   APPearance 03/01/2022 CLOUDY (A)  CLEAR Final   Specific Gravity, Urine 03/01/2022 1.029  1.005 - 1.030 Final   pH 03/01/2022 5.0  5.0 - 8.0 Final   Glucose, UA 03/01/2022 NEGATIVE  NEGATIVE mg/dL Final   Hgb urine dipstick 03/01/2022 NEGATIVE  NEGATIVE Final   Bilirubin Urine 03/01/2022 SMALL (A)  NEGATIVE Final   Ketones, ur 03/01/2022 20 (A)  NEGATIVE mg/dL Final   Protein, ur 32/35/5732 100 (A)  NEGATIVE mg/dL Final   Nitrite 20/25/4270 NEGATIVE   NEGATIVE Final   Leukocytes,Ua 03/01/2022 NEGATIVE  NEGATIVE Final   RBC / HPF 03/01/2022 0-5  0 - 5 RBC/hpf Final   WBC, UA 03/01/2022 0-5  0 - 5 WBC/hpf Final   Bacteria, UA 03/01/2022 FEW (A)  NONE SEEN Final   Squamous Epithelial / HPF 03/01/2022 21-50  0 - 5 /HPF Final   Mucus 03/01/2022 PRESENT   Final   Ca Oxalate Crys, UA 03/01/2022 PRESENT   Final   Performed at [facility], [address], [place], [place] [address]   HIV Screen 4th Generation wRfx 03/01/2022 Non Reactive  Non Reactive Final   Performed at [facility], [address] [place], [place] [address]   SARSCOV2ONAVIRUS 2 AG 03/01/2022 NEGATIVE  NEGATIVE Final   Comment: (NOTE) SARS-CoV-2 antigen NOT DETECTED.   Negative results are presumptive.  Negative results do not preclude SARS-CoV-2 infection and should not be used as the sole basis for treatment or other patient management decisions, including infection  control decisions, particularly in the presence  of clinical signs and  symptoms consistent with COVID-19, or in those who have been in contact with the virus.  Negative results must be combined with clinical observations, patient history, and epidemiological information. The expected result is Negative.  Fact Sheet for Patients: HandmadeRecipes.com.cy  Fact Sheet for Healthcare Providers: FuneralLife.at  This test is not yet approved or cleared by the [place] FDA and  has been authorized for detection and/or diagnosis of SARS-CoV-2 by FDA under an Emergency Use Authorization (EUA).  This EUA will remain in effect (meaning this test can be used) for the duration of  the COV                          ID-19 declaration under Section 564(b)(1) of the Act, 21 U.S.C. section 360bbb-3(b)(1), unless the authorization is terminated or revoked sooner.     Preg Test, Ur 03/01/2022 NEGATIVE  NEGATIVE Final   Comment:        THE SENSITIVITY OF  THIS METHODOLOGY IS >24 mIU/mL   Office Visit on 01/20/2022  Component Date Value Ref Range Status   High risk HPV 01/20/2022 Negative   Final   Adequacy 01/20/2022 Satisfactory for evaluation; transformation zone component PRESENT.   Final   Diagnosis 01/20/2022 - Negative for Intraepithelial Lesions or Malignancy (NILM)   Final   Diagnosis 01/20/2022 - Benign reactive/reparative changes   Final   Microorganisms 01/20/2022 Trichomonas vaginalis present   Final   Comment 01/20/2022 Normal Reference Range HPV - Negative   Final   Neisseria Gonorrhea 01/20/2022 Negative   Final   Chlamydia 01/20/2022 Negative   Final   Trichomonas 01/20/2022 Positive (A)   Final   Bacterial Vaginitis (gardnerella) 01/20/2022 Positive (A)   Final   Candida Vaginitis 01/20/2022 Negative   Final   Candida Glabrata 01/20/2022 Negative   Final   Comment 01/20/2022 Normal Reference Range Bacterial Vaginosis - Negative   Final   Comment 01/20/2022 Normal Reference Range Candida Species - Negative   Final   Comment 01/20/2022 Normal Reference Range Candida Galbrata - Negative   Final   Comment 01/20/2022 Normal Reference Range Trichomonas - Negative   Final   Comment 01/20/2022 Normal Reference Ranger Chlamydia - Negative   Final   Comment 01/20/2022 Normal Reference Range Neisseria Gonorrhea - Negative   Final    Allergies: Penicillins  Medications:   Medical Decision Making  Inpatient observation    Lab Orders         Resp panel by RT-PCR (RSV, Flu A&B, Covid) Anterior Nasal Swab         POCT Urine Drug Screen - (I-Screen)         POC urine preg, ED         POC SARS Coronavirus 2 Ag      Meds ordered this encounter  Medications   acetaminophen (TYLENOL) tablet 650 mg   alum & mag hydroxide-simeth (MAALOX/MYLANTA) 200-200-20 MG/5ML suspension 30 mL   magnesium hydroxide (MILK OF MAGNESIA) suspension 30 mL   AND Linked Order Group    OLANZapine zydis (ZYPREXA) disintegrating tablet 10 mg     LORazepam (ATIVAN) tablet 1 mg    ziprasidone (GEODON) injection 20 mg     Recommendations  Based on my evaluation the patient appears to have an emergency medical condition for which I recommend the patient be transferred to the emergency department for further evaluation.  Evette Georges, NP 03/04/22  5:53 AM

## 2022-03-03 NOTE — Discharge Instructions (Signed)
Contact a health care provider if:  You have a fever.  Your symptoms do not begin to improve within 1-2 days of starting treatment.  Your bone or joint underneath the infected area becomes painful after the skin has healed.  Your infection returns in the same area or another area.  You notice a swollen bump in the infected area.  You develop new symptoms.  You have a general ill feeling (malaise) with muscle aches and pains.  Get help right away if:  Your symptoms get worse.  You feel very sleepy.  You develop vomiting or diarrhea that persists.  You notice red streaks coming from the infected area.  Your red area gets larger or turns dark in color.  These symptoms may represent a serious problem that is an emergency. Do not wait to see if the symptoms will go away. Get medical help right away. Call your local emergency services (911 in the [place] Do not drive yourself to the hospital.

## 2022-03-03 NOTE — ED Notes (Signed)
Pt requested to discharge stating, "My friend is going to take me to [facility]. The drugs are out of my system by now so I'm ready to go. I need a cigarrette. Attempted to encourage pt to complete program but pt refused. MD and SW notified. Pt discharged with belongings returned to pt intact. Denied SI/HI/AVH upon discharge. Verbalized understanding of instructions reviewed on AVS. Safety maintained.

## 2022-03-03 NOTE — ED Provider Notes (Signed)
FBC/OBS ASAP Discharge Summary  Date and Time: 03/03/2022 39:16 AM  Name: [name]  MRN:  [id number]   Discharge Diagnoses:  Final diagnoses:  Polysubstance abuse ([place])  PTSD (post-traumatic stress disorder)  Alcohol use disorder, severe, dependence ([place])  Opioid dependence with opioid-induced mood disorder ([facility])  Benzodiazepine abuse ([place])  Cocaine abuse ([place])  GAD (generalized anxiety disorder)    Subjective: Patient insisting on discharge. She states she wants to go to ED to get arm and foot inspected following discharge and go to [facility] for residential treatment. She understands the risks involved with risk for relapse and severe withdrawal symptoms but still wants to discharge. She states she feels very anxious here due to the idle time and feels she needs to leave. Discussed how this is likely related to her withdrawal symptoms but she states there is too much idle time. She does not want any prescription medications on discharge. She denies SI/HI/AVH.  LCSW spoke with friend that is picking her up and he denies safety concern. Expresses he is worried regarding her substance use but states he does not think she is at risk of self harm or harm to others.  Stay Summary: Patient was monitored for withdrawal symptoms. She complained of bruising and swelling due to IV drug use but these appeared to have improved during hospitalization with ointment antibiotics. Her primary withdrawal symptoms appeared to be anxiety and diaphoresis. She did not want to start any scheduled psychotropics with the exception of prazosin. She denies SI/HI/AVH throughout hospitalization. She was set up by LCSW with [employer] for Tuesday but patient wanted to discharge today nonetheless.   Total Time spent with patient: 45 minutes   Tobacco Cessation:  A prescription for an FDA-approved tobacco cessation medication was offered at discharge and the patient refused  Current Medications:  Current  Facility-Administered Medications  Medication Dose Route Frequency Provider Last Rate Last Admin   acetaminophen (TYLENOL) tablet 650 mg  650 mg Oral Q6H PRN Revonda Humphrey, NP       alum & mag hydroxide-simeth (MAALOX/MYLANTA) 200-200-20 MG/5ML suspension 30 mL  30 mL Oral Q4H PRN Revonda Humphrey, NP       clindamycin (CLEOCIN) capsule 300 mg  300 mg Oral TID Revonda Humphrey, NP   300 mg at 03/03/22 0902   dicyclomine (BENTYL) tablet 20 mg  20 mg Oral Q6H PRN Revonda Humphrey, NP       hydrOXYzine (ATARAX) tablet 25 mg  25 mg Oral Q6H PRN Revonda Humphrey, NP   25 mg at 03/03/22 1751   loperamide (IMODIUM) capsule 2-4 mg  2-4 mg Oral PRN Revonda Humphrey, NP       LORazepam (ATIVAN) tablet 1 mg  1 mg Oral BID France Ravens, MD   1 mg at 03/03/22 0902   magnesium hydroxide (MILK OF MAGNESIA) suspension 30 mL  30 mL Oral Daily PRN Revonda Humphrey, NP       melatonin tablet 5 mg  5 mg Oral QHS France Ravens, MD   5 mg at 03/02/22 2103   methocarbamol (ROBAXIN) tablet 500 mg  500 mg Oral Q8H PRN Revonda Humphrey, NP   500 mg at 03/02/22 2104   multivitamin with minerals tablet 1 tablet  1 tablet Oral Daily Revonda Humphrey, NP   1 tablet at 03/03/22 0902   naproxen (NAPROSYN) tablet 500 mg  500 mg Oral BID PRN Revonda Humphrey, NP   500 mg at 03/03/22 [phone]  neomycin-bacitracin-polymyxin [phone] OINT 1 Application  1 Application Topical TID Revonda Humphrey, NP   1 Application at 67/61/95 0932   ondansetron (ZOFRAN-ODT) disintegrating tablet 4 mg  4 mg Oral Q6H PRN Revonda Humphrey, NP       prazosin (MINIPRESS) capsule 1 mg  1 mg Oral QHS France Ravens, MD   1 mg at 03/02/22 2103   saccharomyces boulardii (FLORASTOR) capsule 500 mg  500 mg Oral Daily Revonda Humphrey, NP   500 mg at 03/03/22 6712   thiamine (VITAMIN B1) tablet 100 mg  100 mg Oral Daily Revonda Humphrey, NP   100 mg at 03/03/22 0902   traZODone (DESYREL) tablet 50 mg  50 mg Oral QHS PRN Revonda Humphrey, NP       No current outpatient medications on file.    PTA Medications:  Facility Ordered Medications  Medication   acetaminophen (TYLENOL) tablet 650 mg   alum & mag hydroxide-simeth (MAALOX/MYLANTA) 200-200-20 MG/5ML suspension 30 mL   magnesium hydroxide (MILK OF MAGNESIA) suspension 30 mL   traZODone (DESYREL) tablet 50 mg   dicyclomine (BENTYL) tablet 20 mg   hydrOXYzine (ATARAX) tablet 25 mg   methocarbamol (ROBAXIN) tablet 500 mg   naproxen (NAPROSYN) tablet 500 mg   [COMPLETED] thiamine (VITAMIN B1) injection 100 mg   thiamine (VITAMIN B1) tablet 100 mg   multivitamin with minerals tablet 1 tablet   loperamide (IMODIUM) capsule 2-4 mg   ondansetron (ZOFRAN-ODT) disintegrating tablet 4 mg   clindamycin (CLEOCIN) capsule 300 mg   neomycin-bacitracin-polymyxin [phone] OINT 1 Application   saccharomyces boulardii (FLORASTOR) capsule 500 mg   melatonin tablet 5 mg   prazosin (MINIPRESS) capsule 1 mg   LORazepam (ATIVAN) tablet 1 mg       03/01/2022    6:50 PM 07/21/2021    2:29 PM 07/19/2019    2:16 PM  Depression screen PHQ 2/9  Decreased Interest 0 0 0  Down, Depressed, Hopeless 1 0 0  PHQ - 2 Score 1 0 0  Altered sleeping 3  1  Tired, decreased energy 1  2  Change in appetite 2  3  Feeling bad or failure about yourself  1  1  Trouble concentrating 2  0  Moving slowly or fidgety/restless 0  0  Suicidal thoughts 0  0  PHQ-9 Score 10  7  Difficult doing work/chores Somewhat difficult  Not difficult at all    [place] [facility] from 03/01/2022 in [facility] ED to Hosp-Admission (Discharged) from 08/30/2021 in [place] ED from 05/07/2021 in [facility] Emergency Department at [place] No Risk High Risk No Risk       Musculoskeletal  Strength & Muscle Tone: within normal limits Gait & Station: normal Patient leans: N/A  Psychiatric Specialty Exam   Presentation  General Appearance:  Casual; Disheveled  Eye Contact: Good  Speech: Normal Rate; Clear and Coherent  Speech Volume: Normal  Handedness: Right   Mood and Affect  Mood: Euphoric; Labile  Affect: Labile; Tearful   Thought Process  Thought Processes: Disorganized  Descriptions of Associations:Circumstantial  Orientation:Full (Time, Place and Person)  Thought Content:Scattered  Diagnosis of Schizophrenia or Schizoaffective disorder in past: No    Hallucinations:Hallucinations: None  Ideas of Reference:None  Suicidal Thoughts:Suicidal Thoughts: No  Homicidal Thoughts:Homicidal Thoughts: No   Sensorium  Memory: Immediate Good; Recent Good; Remote Good  Judgment: Fair  Insight: Fair   Materials engineer: Fair  Attention Span: Fair  Recall: [employer] of Knowledge: Fair  Language: Fair   Psychomotor Activity  Psychomotor Activity: Psychomotor Activity: Increased   Assets  Assets: Communication Skills; Desire for Improvement; Financial Resources/Insurance; Physical Health; Resilience   Sleep  Sleep: Sleep: Poor   No data recorded  Physical Exam  Physical Exam Vitals and nursing note reviewed.  Constitutional:      General: She is not in acute distress.    Appearance: Normal appearance. She is well-developed. She is toxic-appearing and diaphoretic.  HENT:     Head: Normocephalic and atraumatic.  Eyes:     Conjunctiva/sclera: Conjunctivae normal.  Cardiovascular:     Rate and Rhythm: Normal rate and regular rhythm.     Heart sounds: No murmur heard. Pulmonary:     Effort: Pulmonary effort is normal. No respiratory distress.     Breath sounds: Normal breath sounds.  Abdominal:     Palpations: Abdomen is soft.     Tenderness: There is no abdominal tenderness.  Musculoskeletal:        General: No swelling.     Cervical back: Neck supple.  Skin:    General: Skin is warm.     Capillary  Refill: Capillary refill takes less than 2 seconds.     Comments: Significant needle track marks throughout arms and legs. Mild erythema and pain on palpation.   Neurological:     Mental Status: She is alert and oriented to person, place, and time.  Psychiatric:        Mood and Affect: Mood normal.    Blood pressure 117/79, pulse 64, temperature 98.6 F (37 C), temperature source Oral, resp. rate 16, SpO2 100 %. There is no height or weight on file to calculate BMI.  Demographic Factors:  Caucasian  Loss Factors: NA  Historical Factors: Prior suicide attempts, Family history of mental illness or substance abuse, and Impulsivity  Risk Reduction Factors:   Responsible for children under 61 years of age, Sense of responsibility to family, Positive social support, and Positive coping skills or problem solving skills  Continued Clinical Symptoms:  More than one psychiatric diagnosis Previous Psychiatric Diagnoses and Treatments  Cognitive Features That Contribute To Risk:  Polarized thinking    Suicide Risk:  Mild: There are no identifiable plans, no associated intent, mild dysphoria and related symptoms, good self-control (both objective and subjective assessment), few other risk factors, and identifiable protective factors, including available and accessible social support.  Plan Of Care/Follow-up recommendations:   Follow-up recommendations:   Activity:  as tolerated Diet:  heart healthy   Comments:   Patient is agreeable with the discharge plan.  Patient was given an opportunity to ask questions.  Patient appears to feel comfortable with discharge and denies any current suicidal or homicidal thoughts.      France Ravens, MD 03/03/2022, 11:00 AM

## 2022-03-03 NOTE — ED Notes (Signed)
Pt sleeping in no acute distress. RR even and unlabored. Environment secured. Will continue to monitor for safety. 

## 2022-03-03 NOTE — ED Triage Notes (Signed)
Patients reports she is an IV drug user and the other day tried meth.  Visible track marks all over body but patient is concerned about redness and streaking up left arm.

## 2022-03-03 NOTE — ED Triage Notes (Signed)
Pt presents to [facility] voluntarily, unaccompanied requesting substance abuse treatment/detox. Pt reports using Fentanyl daily for the past 20 years. Pt reports being clean for a short period of time last year and relapsed in November of 2023. Pt last used Fentanyl on Monday. Pt reports speaking to [facility] for Residential SA treatment following detox on Tuesday. Pt currently denies SI, HI, AVH.

## 2022-03-03 NOTE — ED Notes (Signed)
Patient observed/assessed in room in bed appearing in no immediate distress resting peacefully. Q15 minute checks continued by MHT and nursing staff. Will continue to monitor and support. 

## 2022-03-03 NOTE — ED Provider Notes (Signed)
[name]   CSN: [id number] Arrival date & time: 03/03/22  1231     History  Chief Complaint  Patient presents with   Cellulitis    [name] is a 37 y.o. female who presents emergency department for evaluation of cellulitis of the foot and left forearm.  Patient was admitted yesterday for IV drug abuse to behavioral health.  She left emergently this morning to get evaluated before going to [facility].  She has been treated with Neosporin and clindamycin states that has improved in the morning her hands are swollen.  She denies any other symptoms at this time. HPI     Home Medications Prior to Admission medications   Medication Sig Start Date End Date Taking? Authorizing Provider  gabapentin (NEURONTIN) 300 MG capsule Take 1 capsule (300 mg total) by mouth 3 (three) times daily. Agitation 09/14/17 04/26/18  Lindell Spar I, NP  traZODone (DESYREL) 100 MG tablet Take 1 tablet (100 mg total) by mouth at bedtime as needed for sleep. 09/14/17 04/26/18  Lindell Spar I, NP      Allergies    Penicillins    Review of Systems   Review of Systems  Physical Exam Updated Vital Signs BP 117/82 (BP Location: Right Arm)   Pulse (!) 107   Temp 98 F (36.7 C) (Oral)   Resp 15   Ht 5' 4"$  (1.626 m)   Wt 59.4 kg   SpO2 99%   BMI 22.49 kg/m  Physical Exam Vitals and nursing [name] reviewed.  Constitutional:      General: She is not in acute distress.    Appearance: She is well-developed. She is not diaphoretic.  HENT:     Head: Normocephalic and atraumatic.     Right Ear: External ear normal.     Left Ear: External ear normal.     Nose: Nose normal.     Mouth/Throat:     Mouth: Mucous membranes are moist.  Eyes:     General: No scleral icterus.    Conjunctiva/sclera: Conjunctivae normal.  Cardiovascular:     Rate and Rhythm: Normal rate and regular rhythm.     Heart sounds: Normal heart sounds. No murmur heard.    No  friction rub. No gallop.  Pulmonary:     Effort: Pulmonary effort is normal. No respiratory distress.     Breath sounds: Normal breath sounds.  Abdominal:     General: Bowel sounds are normal. There is no distension.     Palpations: Abdomen is soft. There is no mass.     Tenderness: There is no abdominal tenderness. There is no guarding.  Musculoskeletal:     Cervical back: Normal range of motion.  Skin:    General: Skin is warm and dry.     Comments: Bilateral upper and lower track marks noted.  She has some soft tissue swelling over the dorsal surface of the left forearm.  Only minimal tenderness.  There is an ulceration at the wrist which is erythematous.  Patient reports that is improving from previous. Previous redness and swelling on the left foot now resolved  Neurological:     Mental Status: She is alert and oriented to person, place, and time.  Psychiatric:        Behavior: Behavior normal.     ED Results / Procedures / Treatments   Labs (all labs ordered are listed, but only abnormal results are displayed) Labs Reviewed - No data to  display  EKG None  Radiology No results found.  Procedures Procedures    Medications Ordered in ED Medications - No data to display  ED Course/ Medical Decision Making/ A&P                             Medical Decision Making Patient here with history of IV drug use.  Recent detox.  She had some cellulitis.  There is no evidence of abscess.  She states that the medication she is taken previously has been improving.  Swelling is worse in the morning when she wakes up and improves throughout the day.  She has no tenderness.  No other signs of emergent issue.  Will continue with treatment with Bactroban ointment and clindamycin.  Discussed return precautions.           Final Clinical Impression(s) / ED Diagnoses Final diagnoses:  None    Rx / DC Orders ED Discharge Orders     None         Margarita Mail,  PA-C 03/03/22 [place], Ankit, MD 03/06/22 [phone]

## 2022-03-04 LAB — RESP PANEL BY RT-PCR (RSV, FLU A&B, COVID)  RVPGX2
Influenza A by PCR: NEGATIVE
Influenza B by PCR: NEGATIVE
Resp Syncytial Virus by PCR: NEGATIVE
SARS Coronavirus 2 by RT PCR: NEGATIVE

## 2022-03-04 MED ORDER — LOPERAMIDE HCL 2 MG PO CAPS: 2.0000 mg | ORAL_CAPSULE | ORAL | Status: DC | PRN

## 2022-03-04 MED ORDER — CLONIDINE HCL 0.1 MG PO TABS: 0.1000 mg | ORAL_TABLET | Freq: Every day | ORAL | Status: DC

## 2022-03-04 MED ORDER — MUPIROCIN CALCIUM 2 % EX CREA
1.0000 | TOPICAL_CREAM | Freq: Two times a day (BID) | CUTANEOUS | Status: DC
  Administered 2022-03-04: 1 via TOPICAL
  Filled 2022-03-04: qty 15

## 2022-03-04 MED ORDER — ONDANSETRON 4 MG PO TBDP: 4.0000 mg | ORAL_TABLET | Freq: Four times a day (QID) | ORAL | Status: DC | PRN

## 2022-03-04 MED ORDER — CLONIDINE HCL 0.1 MG PO TABS
0.1000 mg | ORAL_TABLET | Freq: Four times a day (QID) | ORAL | Status: DC
  Administered 2022-03-04: 0.1 mg via ORAL
  Filled 2022-03-04: qty 1

## 2022-03-04 MED ORDER — DICYCLOMINE HCL 20 MG PO TABS: 20.0000 mg | ORAL_TABLET | Freq: Four times a day (QID) | ORAL | Status: DC | PRN

## 2022-03-04 MED ORDER — NAPROXEN 500 MG PO TABS: 500.0000 mg | ORAL_TABLET | Freq: Two times a day (BID) | ORAL | Status: DC | PRN

## 2022-03-04 MED ORDER — METHOCARBAMOL 500 MG PO TABS: 500.0000 mg | ORAL_TABLET | Freq: Three times a day (TID) | ORAL | Status: DC | PRN

## 2022-03-04 MED ORDER — HYDROXYZINE HCL 25 MG PO TABS: 25.0000 mg | ORAL_TABLET | Freq: Four times a day (QID) | ORAL | Status: DC | PRN

## 2022-03-04 MED ORDER — CLONIDINE HCL 0.1 MG PO TABS: 0.1000 mg | ORAL_TABLET | ORAL | Status: DC

## 2022-03-04 MED ORDER — CLINDAMYCIN HCL 150 MG PO CAPS
300.0000 mg | ORAL_CAPSULE | Freq: Four times a day (QID) | ORAL | Status: DC
  Administered 2022-03-04: 300 mg via ORAL
  Filled 2022-03-04: qty 2

## 2022-03-04 NOTE — ED Notes (Signed)
Patient A&Ox4. Patient denies SI/HI and AVH. Patient denies any physical complaints when asked. No acute distress noted. Support and encouragement provided. Routine safety checks conducted according to facility protocol. Encouraged patient to notify staff if thoughts of harm toward self or others arise. Patient verbalize understanding and agreement. Will continue to monitor for safety.    

## 2022-03-04 NOTE — ED Notes (Signed)
Patient resting quietly in bed with eyes closed. Respirations equal and unlabored, skin warm and dry, NAD. Routine safety checks conducted according to facility protocol. Will continue to monitor for safety.  

## 2022-03-04 NOTE — ED Provider Notes (Signed)
FBC/OBS ASAP Discharge Summary  Date and Time: 03/04/2022 10:11 AM  Name: [name]  MRN:  [id number]   Discharge Diagnoses:  Final diagnoses:  Polysubstance abuse (HCC)  Opioid abuse (HCC)    Subjective: Patient seen and evaluated face to face by this provider, chart reviewed and case discussed with Dr. Lucianne Muss. On evaluation, patient is alert and oriented x 4. Her thought process is linear and goal oriented. Her speech is clear and coherent. Her mood is euthymic and affect is congruent. She does not appear to be in acute distress. She denies suicidal ideations. She denies homicidal ideations. She denies auditory or visual hallucinations. There is no objective evidence that the patient is currently responding to internal or external stimuli. Patient states that she is seeking substance abuse treatment and has an appointment with [facility] on Tuesday for residential treatment. She reports using fentanyl intermittently every day for the past 20 years. She does not appear to be in significant withdrawal. She denies currently using other illicit drugs except for daily marijuana use. Patient's UDS positive for amphetamines, benzodiazepines, methamphetamines, THC, and morphine. She reports that in the past she has mixed Xanax and marijuana with her fentanyl. She states that she was given benzos yesterday while admitted on the unit [facility]). She states that she lives alone with her 33-year-old daughter. She is currently on probation for drug charges. Patient agreeable to following up with outpatient substance abuse services and [facility] on Tuesday for residential services.   Stay Summary:  Per H&P, [name], 37 y/o female with a history of polysubstance abuse, PTSD, alcohol use disorder, opioid dependent with opioid-induced mood disorder cocaine abuse, mood disorder and anxiety presented to [facility] voluntarily tonight after leaving the [facility] AMA today.  Per the patient she was a little bit anxious today  and that is why she left patient stated she wants to stay the time so that she can get into the programs that they already lined up for her.    Triage notes Pt presents to [facility] voluntarily, unaccompanied requesting substance abuse treatment/detox. Pt reports using Fentanyl daily for the past 20 years. Pt reports being clean for a short period of time last year and relapsed in November of 2023. Pt last used Fentanyl on Monday. Pt reports speaking to [facility] for Residential SA treatment following detox on Tuesday. Pt currently denies SI, HI, AVH.   Total Time spent with patient: 30 minutes  Past Psychiatric History: a history of polysubstance abuse, PTSD, alcohol use disorder, opioid dependent with opioid-induced mood disorder cocaine abuse, mood disorder, and anxiety. Past Medical History: cellulitis of the foot and left forearm  Family History: No history reported.  Family Psychiatric History: No history reported.  Social History: daily fentanyl use.  Tobacco Cessation:  N/A, patient does not currently use tobacco products  Current Medications:  Current Facility-Administered Medications  Medication Dose Route Frequency Provider Last Rate Last Admin   acetaminophen (TYLENOL) tablet 650 mg  650 mg Oral Q6H PRN Sindy Guadeloupe, NP       alum & mag hydroxide-simeth (MAALOX/MYLANTA) 200-200-20 MG/5ML suspension 30 mL  30 mL Oral Q4H PRN Sindy Guadeloupe, NP       clindamycin (CLEOCIN) capsule 300 mg  300 mg Oral QID Shireen Rayburn L, NP   300 mg at 03/04/22 4782   cloNIDine (CATAPRES) tablet 0.1 mg  0.1 mg Oral QID Makaylia Hewett L, NP   0.1 mg at 03/04/22 0926   Followed by   Melene Muller ON 03/06/2022] cloNIDine (  CATAPRES) tablet 0.1 mg  0.1 mg Oral BH-qamhs Aviah Sorci L, NP       Followed by   Derrill Memo ON 03/08/2022] cloNIDine (CATAPRES) tablet 0.1 mg  0.1 mg Oral QAC breakfast Wilkin Lippy L, NP       dicyclomine (BENTYL) tablet 20 mg  20 mg Oral Q6H PRN Jasani Dolney L, NP       hydrOXYzine (ATARAX)  tablet 25 mg  25 mg Oral Q6H PRN Solara Goodchild L, NP       loperamide (IMODIUM) capsule 2-4 mg  2-4 mg Oral PRN Shelene Krage L, NP       OLANZapine zydis (ZYPREXA) disintegrating tablet 10 mg  10 mg Oral Q8H PRN Evette Georges, NP       And   LORazepam (ATIVAN) tablet 1 mg  1 mg Oral PRN Evette Georges, NP       And   ziprasidone (GEODON) injection 20 mg  20 mg Intramuscular PRN Evette Georges, NP       magnesium hydroxide (MILK OF MAGNESIA) suspension 30 mL  30 mL Oral Daily PRN Evette Georges, NP       methocarbamol (ROBAXIN) tablet 500 mg  500 mg Oral Q8H PRN Zenith Lamphier L, NP       mupirocin cream (BACTROBAN) 2 % 1 Application  1 Application Topical BID Larisha Vencill L, NP   1 Application at 47/82/95 0926   naproxen (NAPROSYN) tablet 500 mg  500 mg Oral BID PRN Tatem Holsonback L, NP       ondansetron (ZOFRAN-ODT) disintegrating tablet 4 mg  4 mg Oral Q6H PRN Porshia Blizzard L, NP       Current Outpatient Medications  Medication Sig Dispense Refill   clindamycin (CLEOCIN) 150 MG capsule Take 2 capsules (300 mg total) by mouth 4 (four) times daily for 7 days. X 7 days 56 capsule 0   mupirocin cream (BACTROBAN) 2 % Apply 1 Application topically 2 (two) times daily. 30 g 0    PTA Medications:  Facility Ordered Medications  Medication   acetaminophen (TYLENOL) tablet 650 mg   alum & mag hydroxide-simeth (MAALOX/MYLANTA) 200-200-20 MG/5ML suspension 30 mL   magnesium hydroxide (MILK OF MAGNESIA) suspension 30 mL   OLANZapine zydis (ZYPREXA) disintegrating tablet 10 mg   And   LORazepam (ATIVAN) tablet 1 mg   And   ziprasidone (GEODON) injection 20 mg   clindamycin (CLEOCIN) capsule 300 mg   mupirocin cream (BACTROBAN) 2 % 1 Application   dicyclomine (BENTYL) tablet 20 mg   hydrOXYzine (ATARAX) tablet 25 mg   loperamide (IMODIUM) capsule 2-4 mg   methocarbamol (ROBAXIN) tablet 500 mg   naproxen (NAPROSYN) tablet 500 mg   ondansetron (ZOFRAN-ODT) disintegrating tablet 4 mg    cloNIDine (CATAPRES) tablet 0.1 mg   Followed by   Derrill Memo ON 03/06/2022] cloNIDine (CATAPRES) tablet 0.1 mg   Followed by   Derrill Memo ON 03/08/2022] cloNIDine (CATAPRES) tablet 0.1 mg       03/03/2022   11:14 AM 03/01/2022    6:50 PM 07/21/2021    2:29 PM  Depression screen PHQ 2/9  Decreased Interest 0 0 0  Down, Depressed, Hopeless 1 1 0  PHQ - 2 Score 1 1 0  Altered sleeping  3   Tired, decreased energy  1   Change in appetite  2   Feeling bad or failure about yourself   1   Trouble concentrating  2   Moving slowly or fidgety/restless  0  Suicidal thoughts  0   PHQ-9 Score  10   Difficult doing work/chores  Somewhat difficult     Flowsheet Row ED from 03/03/2022 in [facility] Most recent reading at 03/04/2022 12:15 AM ED from 03/03/2022 in [facility] Emergency Department at [facility] Most recent reading at 03/03/2022  1:10 PM ED from 03/01/2022 in [facility] Most recent reading at 03/01/2022  8:53 PM  C-SSRS RISK CATEGORY No Risk No Risk No Risk       Musculoskeletal  Strength & Muscle Tone: within normal limits Gait & Station: normal Patient leans: N/A  Psychiatric Specialty Exam  Presentation  General Appearance:  Appropriate for Environment  Eye Contact: Fair  Speech: Clear and Coherent  Speech Volume: Normal  Handedness: Right   Mood and Affect  Mood: Euthymic  Affect: Congruent   Thought Process  Thought Processes: Coherent; Goal Directed  Descriptions of Associations:Intact  Orientation:Full (Time, Place and Person)  Thought Content:Logical  Diagnosis of Schizophrenia or Schizoaffective disorder in past: No    Hallucinations:Hallucinations: None  Ideas of Reference:None  Suicidal Thoughts:Suicidal Thoughts: No  Homicidal Thoughts:Homicidal Thoughts: No   Sensorium  Memory: Immediate Fair; Remote Fair; Recent Fair  Judgment: Intact  Insight: Present   Executive  Functions  Concentration: Fair  Attention Span: Fair  Recall: [employer] of Knowledge: Fair  Language: Fair   Psychomotor Activity  Psychomotor Activity: Psychomotor Activity: Normal   Assets  Assets: Communication Skills; Desire for Improvement; Housing; Physical Health; Social Support   Sleep  Sleep: Sleep: Fair Number of Hours of Sleep: 7   Nutritional Assessment (For OBS and FBC admissions only) Has the patient had a weight loss or gain of 10 pounds or more in the last 3 months?: Yes Has the patient had a decrease in food intake/or appetite?: Yes Does the patient have dental problems?: No Does the patient have eating habits or behaviors that may be indicators of an eating disorder including binging or inducing vomiting?: No Has the patient recently lost weight without trying?: 0 Has the patient been eating poorly because of a decreased appetite?: 0 Malnutrition Screening Tool Score: 0    Physical Exam  Physical Exam HENT:     Head: Normocephalic.     Nose: Nose normal.  Eyes:     Conjunctiva/sclera: Conjunctivae normal.  Cardiovascular:     Rate and Rhythm: Normal rate.  Pulmonary:     Effort: Pulmonary effort is normal.  Musculoskeletal:        General: Normal range of motion.  Neurological:     Mental Status: She is alert. Mental status is at baseline.    Review of Systems  Constitutional: Negative.   HENT: Negative.    Eyes: Negative.   Respiratory: Negative.    Cardiovascular: Negative.   Gastrointestinal: Negative.   Genitourinary: Negative.   Musculoskeletal: Negative.   Neurological: Negative.   Endo/Heme/Allergies: Negative.   Psychiatric/Behavioral:  Positive for substance abuse.    Blood pressure 114/84, pulse 76, temperature 98.8 F (37.1 C), temperature source Oral, resp. rate 18, SpO2 100 %. There is no height or weight on file to calculate BMI.  Demographic Factors:  Caucasian and Low socioeconomic status  Loss  Factors: Legal issues  Historical Factors: NA  Risk Reduction Factors:   Responsible for children under 33 years of age, Sense of responsibility to family, and Religious beliefs about death  Continued Clinical Symptoms:  Alcohol/Substance Abuse/Dependencies  Cognitive Features That  Contribute To Risk:  None    Suicide Risk:  Minimal: No identifiable suicidal ideation.  Patients presenting with no risk factors but with morbid ruminations; may be classified as minimal risk based on the severity of the depressive symptoms  Plan Of Care/Follow-up recommendations:  Activity:  as tolerate.   Medications: Patient is to take medications as prescribed. The patient or patient's guardian is to contact a medical professional and/or outpatient provider to address any new side effects that develop. The patient or the patient's guardian should update outpatient providers of any new medications and/or medication changes.   Outpatient Follow up: Please review list of outpatient resources for psychiatry and counseling. Please follow up with your primary care provider for all medical related needs.   Substance abuse: Please follow up with [facility] on Tuesday for your scheduled screening for residential treatment. Please see list of resource guide for additional treatment options for outpatient and residential services.   Therapy: We recommend that patient participate in individual therapy to address mental health concerns.  Safety:   The following safety precautions should be taken:   No sharp objects. This includes scissors, razors, scrapers, and putty knives.   Chemicals should be removed and locked up.   Medications should be removed and locked up.   Weapons should be removed and locked up. This includes firearms, knives and instruments that can be used to cause injury.   The patient should abstain from use of illicit substances/drugs and abuse of any medications.  If symptoms worsen or do  not continue to improve or if the patient becomes actively suicidal or homicidal then it is recommended that the patient return to the closest hospital emergency department, the [facility], or call 911 for further evaluation and treatment. National Suicide Prevention Lifeline 1-800-SUICIDE or (670)151-8582.  About 988 988 offers 24/7 access to trained crisis counselors who can help people experiencing mental health-related distress. People can call or text 988 or chat 988lifeline.org for themselves or if they are worried about a loved one who may need crisis support.   Disposition: Discharge.   Wynell Halberg L, NP 03/04/2022, 10:11 AM

## 2022-03-04 NOTE — ED Notes (Signed)
Pt is in the bed sleeping. Respirations are even and unlabored. No acute distress noted. Will continue to monitor for safety. 

## 2022-03-04 NOTE — ED Notes (Signed)
Patient voluntarily walked in requesting needing help on substance abuse. Denies AVH AND SI  Skin assessment complete. Belongings inventoried. Patient oriented to unit and unit rules. Meal and drinks offered to patient.  Patient verbalized agreement to treatment plans. Patient verbally contracts for safety while hospitalized. Will monitor for safety.

## 2022-03-04 NOTE — Discharge Instructions (Addendum)
Discharge recommendations:   Medications: Patient is to take medications as prescribed. The patient or patient's guardian is to contact a medical professional and/or outpatient provider to address any new side effects that develop. The patient or the patient's guardian should update outpatient providers of any new medications and/or medication changes.   Outpatient Follow up: Please review list of outpatient resources for psychiatry and counseling. Please follow up with your primary care provider for all medical related needs.   Substance abuse: Please follow up with [facility] on Tuesday for your scheduled screening for residential treatment. Please see list of resource guide for additional treatment options for outpatient and residential services.   Therapy: We recommend that patient participate in individual therapy to address mental health concerns.  Safety:   The following safety precautions should be taken:   No sharp objects. This includes scissors, razors, scrapers, and putty knives.   Chemicals should be removed and locked up.   Medications should be removed and locked up.   Weapons should be removed and locked up. This includes firearms, knives and instruments that can be used to cause injury.   The patient should abstain from use of illicit substances/drugs and abuse of any medications.  If symptoms worsen or do not continue to improve or if the patient becomes actively suicidal or homicidal then it is recommended that the patient return to the closest hospital emergency department, the [facility], or call 911 for further evaluation and treatment. National Suicide Prevention Lifeline 1-800-SUICIDE or 4184736076.  About 988 988 offers 24/7 access to trained crisis counselors who can help people experiencing mental health-related distress. People can call or text 988 or chat 988lifeline.org for themselves or if they are worried about a loved one who  may need crisis support.

## 2022-03-04 NOTE — ED Notes (Signed)
Patient A&O x 4, ambulatory. Patient discharged in no acute distress. Patient denied SI/HI, A/VH upon discharge. Patient verbalized understanding of all discharge instructions explained by staff, to include follow up appointments, RX's and safety plan. Patient reported mood 10/10.  Pt belongings returned to patient from locker #  23 intact. Patient escorted to lobby via staff for transport to destination. Safety maintained.  

## 2022-09-06 ENCOUNTER — Emergency Department (HOSPITAL_COMMUNITY)
Admission: EM | Admit: 2022-09-06 | Discharge: 2022-09-06 | Disposition: A | Discharge status: Home / Self Care | Payer: MEDICAID | Attending: Emergency Medicine | Admitting: Emergency Medicine

## 2022-09-06 ENCOUNTER — Other Ambulatory Visit: Payer: Self-pay

## 2022-09-06 ENCOUNTER — Emergency Department (HOSPITAL_COMMUNITY): Payer: MEDICAID

## 2022-09-06 ENCOUNTER — Encounter (HOSPITAL_COMMUNITY): Payer: Self-pay

## 2022-09-06 DIAGNOSIS — U071 COVID-19: Secondary | ICD-10-CM | POA: Diagnosis not present

## 2022-09-06 DIAGNOSIS — M79645 Pain in left finger(s): Secondary | ICD-10-CM | POA: Insufficient documentation

## 2022-09-06 DIAGNOSIS — Z3A08 8 weeks gestation of pregnancy: Secondary | ICD-10-CM

## 2022-09-06 DIAGNOSIS — O26891 Other specified pregnancy related conditions, first trimester: Secondary | ICD-10-CM | POA: Insufficient documentation

## 2022-09-06 DIAGNOSIS — Z3A01 Less than 8 weeks gestation of pregnancy: Secondary | ICD-10-CM | POA: Diagnosis not present

## 2022-09-06 DIAGNOSIS — S6992XA Unspecified injury of left wrist, hand and finger(s), initial encounter: Secondary | ICD-10-CM

## 2022-09-06 LAB — RESP PANEL BY RT-PCR (RSV, FLU A&B, COVID)  RVPGX2
Influenza A by PCR: NEGATIVE
Influenza B by PCR: NEGATIVE
Resp Syncytial Virus by PCR: NEGATIVE
SARS Coronavirus 2 by RT PCR: POSITIVE — AB

## 2022-09-06 LAB — COMPREHENSIVE METABOLIC PANEL
ALT: 19 U/L (ref 0–44)
AST: 21 U/L (ref 15–41)
Albumin: 3.8 g/dL (ref 3.5–5.0)
Alkaline Phosphatase: 57 U/L (ref 38–126)
Anion gap: 13 (ref 5–15)
BUN: 9 mg/dL (ref 6–20)
CO2: 21 mmol/L — ABNORMAL LOW (ref 22–32)
Calcium: 9.1 mg/dL (ref 8.9–10.3)
Chloride: 105 mmol/L (ref 98–111)
Creatinine, Ser: 0.66 mg/dL (ref 0.44–1.00)
GFR, Estimated: >60 mL/min (ref >60)
Glucose, Bld: 88 mg/dL (ref 70–99)
Potassium: 3.7 mmol/L (ref 3.5–5.1)
Sodium: 139 mmol/L (ref 135–145)
Total Bilirubin: 0.2 mg/dL — ABNORMAL LOW (ref 0.3–1.2)
Total Protein: 7.4 g/dL (ref 6.5–8.1)

## 2022-09-06 LAB — CBC
HCT: 37.4 % (ref 36.0–46.0)
Hemoglobin: 12.3 g/dL (ref 12.0–15.0)
MCH: 33.3 pg (ref 26.0–34.0)
MCHC: 32.9 g/dL (ref 30.0–36.0)
MCV: 101.4 fL — ABNORMAL HIGH (ref 80.0–100.0)
Platelets: 233 10*3/uL (ref 150–400)
RBC: 3.69 MIL/uL — ABNORMAL LOW (ref 3.87–5.11)
RDW: 14.1 % (ref 11.5–15.5)
WBC: 4.9 10*3/uL (ref 4.0–10.5)
nRBC: 0 % (ref 0.0–0.2)

## 2022-09-06 LAB — URINALYSIS, ROUTINE W REFLEX MICROSCOPIC
Bilirubin Urine: NEGATIVE
Glucose, UA: NEGATIVE mg/dL
Hgb urine dipstick: NEGATIVE
Ketones, ur: NEGATIVE mg/dL
Leukocytes,Ua: NEGATIVE
Nitrite: NEGATIVE
Protein, ur: 30 mg/dL — AB
Specific Gravity, Urine: 1.02 (ref 1.005–1.030)
pH: 5 (ref 5.0–8.0)

## 2022-09-06 LAB — LIPASE, BLOOD: Lipase: 25 U/L (ref 11–51)

## 2022-09-06 LAB — HCG, SERUM, QUALITATIVE: Preg, Serum: POSITIVE — AB

## 2022-09-06 MED ORDER — ACETAMINOPHEN 325 MG PO TABS: 650.0000 mg | ORAL_TABLET | Freq: Four times a day (QID) | ORAL | 0 refills | Status: AC | PRN

## 2022-09-06 NOTE — Discharge Instructions (Addendum)
Your COVID-19 test was positive.  Your symptoms should begin to improve over the next few days.  You can take 650mg  of Tylenol every 8 hours.  I have also included and OB/GYN doctor to follow-up with within 2 to 3 weeks to schedule follow-up for your pregnancy.  Come to the emergency department for sudden onset of worsening pain in your abdomen.
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
## 2022-09-06 NOTE — ED Notes (Signed)
Patient transported to X-ray 
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
## 2022-09-19 ENCOUNTER — Inpatient Hospital Stay (HOSPITAL_COMMUNITY)
Admission: AD | Admit: 2022-09-19 | Discharge: 2022-09-20 | Attending: Obstetrics & Gynecology | Admitting: Obstetrics & Gynecology

## 2022-09-19 ENCOUNTER — Encounter (HOSPITAL_COMMUNITY): Payer: Self-pay

## 2022-09-19 DIAGNOSIS — O219 Vomiting of pregnancy, unspecified: Secondary | ICD-10-CM | POA: Diagnosis present

## 2022-09-19 DIAGNOSIS — Z3A01 Less than 8 weeks gestation of pregnancy: Secondary | ICD-10-CM | POA: Diagnosis not present

## 2022-09-19 DIAGNOSIS — O26851 Spotting complicating pregnancy, first trimester: Secondary | ICD-10-CM | POA: Diagnosis not present

## 2022-09-19 DIAGNOSIS — O30041 Twin pregnancy, dichorionic/diamniotic, first trimester: Secondary | ICD-10-CM | POA: Diagnosis present

## 2022-09-19 DIAGNOSIS — R112 Nausea with vomiting, unspecified: Secondary | ICD-10-CM

## 2022-09-19 DIAGNOSIS — O99321 Drug use complicating pregnancy, first trimester: Secondary | ICD-10-CM | POA: Diagnosis present

## 2022-09-19 DIAGNOSIS — O99331 Smoking (tobacco) complicating pregnancy, first trimester: Secondary | ICD-10-CM | POA: Diagnosis not present

## 2022-09-19 DIAGNOSIS — F17210 Nicotine dependence, cigarettes, uncomplicated: Secondary | ICD-10-CM | POA: Insufficient documentation

## 2022-09-19 DIAGNOSIS — N939 Abnormal uterine and vaginal bleeding, unspecified: Secondary | ICD-10-CM | POA: Insufficient documentation

## 2022-09-19 DIAGNOSIS — R102 Pelvic and perineal pain: Secondary | ICD-10-CM | POA: Diagnosis present

## 2022-09-19 DIAGNOSIS — F1190 Opioid use, unspecified, uncomplicated: Secondary | ICD-10-CM | POA: Insufficient documentation

## 2022-09-19 LAB — URINALYSIS, ROUTINE W REFLEX MICROSCOPIC
Bilirubin Urine: NEGATIVE
Glucose, UA: NEGATIVE mg/dL
Hgb urine dipstick: NEGATIVE
Ketones, ur: 20 mg/dL — AB
Leukocytes,Ua: NEGATIVE
Nitrite: NEGATIVE
Protein, ur: NEGATIVE mg/dL
Specific Gravity, Urine: 1.015 (ref 1.005–1.030)
pH: 5 (ref 5.0–8.0)

## 2022-09-19 LAB — CBC
HCT: 44.7 % (ref 36.0–46.0)
Hemoglobin: 15.8 g/dL — ABNORMAL HIGH (ref 12.0–15.0)
MCH: 32.1 pg (ref 26.0–34.0)
MCHC: 35.3 g/dL (ref 30.0–36.0)
MCV: 90.9 fL (ref 80.0–100.0)
Platelets: 256 10*3/uL (ref 150–400)
RBC: 4.92 MIL/uL (ref 3.87–5.11)
RDW: 13.4 % (ref 11.5–15.5)
WBC: 7 10*3/uL (ref 4.0–10.5)
nRBC: 0 % (ref 0.0–0.2)

## 2022-09-19 LAB — WET PREP, GENITAL
Clue Cells Wet Prep HPF POC: NONE SEEN
Sperm: NONE SEEN
Trich, Wet Prep: NONE SEEN
WBC, Wet Prep HPF POC: <10 (ref <10)
Yeast Wet Prep HPF POC: NONE SEEN

## 2022-09-19 LAB — ABO/RH: ABO/RH(D): O POS

## 2022-09-19 MED ORDER — LACTATED RINGERS IV BOLUS
1000.0000 mL | Freq: Once | INTRAVENOUS | Status: AC
  Administered 2022-09-19: 1000 mL via INTRAVENOUS

## 2022-09-19 MED ORDER — ONDANSETRON 4 MG PO TBDP
4.0000 mg | ORAL_TABLET | Freq: Once | ORAL | Status: AC
  Administered 2022-09-19: 4 mg via ORAL
  Filled 2022-09-19: qty 1

## 2022-09-19 NOTE — MAU Note (Signed)
..  [name] is a 37 y.o. at Unknown here in MAU reporting: has not been able to keep anything down since Friday. Reports abdominal cramping and back pain that began some time last week. Reports light pink vaginal bleeding that began today.   Patient reports she was on Methadone, came off of it and began using. Reports last time she used was Friday morning;   Had a positive pregnancy test two weeks ago.   LMP: unsure   Pain score: 6/10 Vitals:   09/19/22 2017  BP: 117/85  Pulse: 83  Resp: 18  Temp: 98.4 F (36.9 C)  SpO2: 100%     FHT: n/a Lab orders placed from triage: UA. Patient unable to void instructions given to void and call nurses station when able to

## 2022-09-19 NOTE — MAU Provider Note (Signed)
History     CSN: [id number]  Arrival date and time: 09/19/22 1847   Event Date/Time   First Provider Initiated Contact with Patient 09/19/22 2028      Chief Complaint  Patient presents with   Abdominal Pain   Vaginal Bleeding   Back Pain   [name] , a  37 y.o. [id number] at Unknown presents to MAU with complaints of light pink spotting and nausea. Patient reports that she started spotting 2 days ago. Reports that its light pink and only when she wipes. She denies bright red vaginal bleeding or passing clots.  Also noted some clear watery like discharge that has been on-going for the last 2 days.Denies an odor, or color. Reports a positive pregnancy test 2 weeks ago at home. She states that she has not ben able to keep anything down for the last 4 day. She denies vomiting today, only nausea. She states that she de-toxed off her methadone 1 month ago. She reports that on Friday she went back out and started using again. She states that she used "street xanax, klonopin, crack that may have been laced with fentanyl and meth" all last use was Friday. She states that "all of her symptoms could be due to her actively detox-ing, but was concerned for the pregnancy."   She is accompanied by a Hydrographic surveyor.          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 maintenance, antepartum (HCC)    SVD (spontaneous vaginal delivery) 04/17/2014    Past Surgical History:  Procedure Laterality Date   BREAST BIOPSY Right    BREAST SURGERY     reduction    Family History  Problem Relation Age of Onset   Breast cancer Mother    Stroke Mother    Breast cancer Maternal  Grandmother    Depression Sister     Social History   Tobacco Use   Smoking status: Every Day    Current packs/day: 0.25    Average packs/day: 0.3 packs/day for 10.0 years (2.5 ttl pk-yrs)    Types: Cigarettes   Smokeless tobacco: Never  Vaping Use   Vaping status: Never Used  Substance Use Topics   Alcohol use: No   Drug use: Yes    Types: Heroin, Marijuana, Fentanyl    Comment: Currently on Subutex    Allergies:  Allergies  Allergen Reactions   Penicillins Rash and Other (See Comments)    Causes yeast infection Has patient had a PCN reaction causing immediate rash, facial/tongue/throat swelling, SOB or lightheadedness with hypotension: Yes Has patient had a PCN reaction causing severe rash involving mucus membranes or skin necrosis: No Has patient had a PCN reaction that required hospitalization: No Has patient had a PCN reaction occurring within the last 10 years: No If all of the above answers are "NO", then may proceed with Cephalosporin use.    Medications  Prior to Admission  Medication Sig Dispense Refill Last Dose   mupirocin cream (BACTROBAN) 2 % Apply 1 Application topically 2 (two) times daily. 30 g 0     Review of Systems  Constitutional:  Negative for chills, fatigue and fever.  Eyes:  Negative for pain and visual disturbance.  Respiratory:  Negative for apnea, shortness of breath and wheezing.   Cardiovascular:  Negative for chest pain and palpitations.  Gastrointestinal:  Positive for abdominal pain. Negative for constipation, diarrhea, nausea and vomiting.  Genitourinary:  Positive for pelvic pain, vaginal bleeding and vaginal discharge. Negative for difficulty urinating, dysuria and vaginal pain.  Musculoskeletal:  Negative for back pain.  Neurological:  Negative for seizures, weakness and headaches.  Psychiatric/Behavioral:  Negative for suicidal ideas.    Physical Exam   Blood pressure 111/69, pulse 67, temperature 98.1 F (36.7 C), temperature  source Oral, resp. rate 18, height 5\' 4"  (1.626 m), weight 52.9 kg, SpO2 100%.  Physical Exam Vitals and nursing note reviewed.  Constitutional:      General: She is not in acute distress.    Appearance: Normal appearance. She is ill-appearing and diaphoretic.  HENT:     Head: Normocephalic.  Pulmonary:     Effort: Pulmonary effort is normal.  Abdominal:     General: Abdomen is flat. There is no distension.     Tenderness: There is abdominal tenderness in the right lower quadrant. There is no right CVA tenderness, left CVA tenderness or guarding.  Musculoskeletal:     Cervical back: Normal range of motion.  Skin:    General: Skin is warm.     Capillary Refill: Capillary refill takes 2 to 3 seconds.     Coloration: Skin is pale.  Neurological:     Mental Status: She is alert and oriented to person, place, and time.  Psychiatric:        Mood and Affect: Mood normal.     MAU Course  Procedures Orders Placed This Encounter  Procedures   Wet prep, genital   US OB LESS THAN 14 WEEKS WITH OB TRANSVAGINAL   US OB Comp AddL Gest Less 14 Wks   Urinalysis, Routine w reflex microscopic -Urine, Clean Catch   CBC   hCG, quantitative, pregnancy   Diet NPO time specified   ABO/Rh   Discharge patient   Meds ordered this encounter  Medications   ondansetron (ZOFRAN-ODT) disintegrating tablet 4 mg   lactated ringers bolus 1,000 mL   scopolamine (TRANSDERM-SCOP) 1 MG/3DAYS 1.5 mg   promethazine (PHENERGAN) 25 mg in sodium chloride 0.9 % 50 mL IVPB   ondansetron (ZOFRAN) 8 MG tablet    Sig: Take 1 tablet (8 mg total) by mouth every 8 (eight) hours as needed for nausea or vomiting.    Dispense:  20 tablet    Refill:  0    Order Specific Question:   Supervising Provider    Answer:   Reva Bores [[id number]]   DISCONTD: ondansetron (ZOFRAN-ODT) disintegrating tablet 8 mg   ondansetron (ZOFRAN-ODT) disintegrating tablet 4 mg   Results for orders placed or performed during the hospital  encounter of 09/19/22 (from the past 24 hour(s))  Urinalysis, Routine w reflex microscopic -Urine, Clean Catch     Status: Abnormal   Collection Time: 09/19/22  8:30 PM  Result Value Ref Range   Color, Urine YELLOW YELLOW   APPearance CLOUDY (A) CLEAR   Specific Gravity, Urine 1.015 1.005 - 1.030   pH 5.0 5.0 -  8.0   Glucose, UA NEGATIVE NEGATIVE mg/dL   Hgb urine dipstick NEGATIVE NEGATIVE   Bilirubin Urine NEGATIVE NEGATIVE   Ketones, ur 20 (A) NEGATIVE mg/dL   Protein, ur NEGATIVE NEGATIVE mg/dL   Nitrite NEGATIVE NEGATIVE   Leukocytes,Ua NEGATIVE NEGATIVE  CBC     Status: Abnormal   Collection Time: 09/19/22 10:15 PM  Result Value Ref Range   WBC 7.0 4.0 - 10.5 K/uL   RBC 4.92 3.87 - 5.11 MIL/uL   Hemoglobin 15.8 (H) 12.0 - 15.0 g/dL   HCT 13.2 44.0 - 10.2 %   MCV 90.9 80.0 - 100.0 fL   MCH 32.1 26.0 - 34.0 pg   MCHC 35.3 30.0 - 36.0 g/dL   RDW 72.5 36.6 - 44.0 %   Platelets 256 150 - 400 K/uL   nRBC 0.0 0.0 - 0.2 %  ABO/Rh     Status: None   Collection Time: 09/19/22 10:15 PM  Result Value Ref Range   ABO/RH(D) O POS    No rh immune globuloin      NOT A RH IMMUNE GLOBULIN CANDIDATE, PT RH POSITIVE Performed at [facility], [address], [place], [place] [address]   Wet prep, genital     Status: None   Collection Time: 09/19/22 10:15 PM  Result Value Ref Range   Yeast Wet Prep HPF POC NONE SEEN NONE SEEN   Trich, Wet Prep NONE SEEN NONE SEEN   Clue Cells Wet Prep HPF POC NONE SEEN NONE SEEN   WBC, Wet Prep HPF POC <10 <10   Sperm NONE SEEN    US OB LESS THAN 14 WEEKS WITH OB TRANSVAGINAL  Result Date: 09/20/2022 CLINICAL DATA:  Nausea and vomiting with vaginal spotting. EXAM: TWIN OBSTETRIC <14WK [place] AND TRANSVAGINAL OB US TECHNIQUE: Both transabdominal and transvaginal ultrasound examinations were performed for complete evaluation of the gestation as well as the maternal uterus, adnexal regions, and pelvic cul-de-sac. Transvaginal technique was performed to  assess early pregnancy. COMPARISON:  None Available. FINDINGS: Number of IUPs:  2 Chorionicity/Amnionicity:  Dichorionic-diamniotic (thick membrane) TWIN 1 Yolk sac:  Visualized. Embryo:  Visualized. Cardiac Activity: Visualized. Heart Rate: 109 bpm CRL:  2.4 mm   5 w 5 d                  [place] EDC: May 18, 2023 TWIN 2 Yolk sac:  Visualized. Embryo:  Not Visualized. Cardiac Activity: Not Visualized. Heart Rate: N/A bpm MSD: 8.4 mm   5 w   4 d              [place] EDC: May 19, 2023 Subchorionic hemorrhage:  None visualized. Maternal uterus/adnexae: Right ovary is visualized and is normal in appearance. A corpus luteum cyst is seen within otherwise normal appearing left ovary. No pelvic free fluid is seen. IMPRESSION: Dichorionic/diamniotic intrauterine twin pregnancy with a viable Fetus A (at approximately 5 weeks and 5 days gestation by ultrasound evaluation) and probable early yolk sac, but no fetal pole or cardiac activity yet visualized for Fetus B. Recommend follow-up quantitative B-HCG levels and follow-up US in 14 days to assess viability. This recommendation follows SRU consensus guidelines: Diagnostic Criteria for Nonviable Pregnancy Early in the First Trimester. Malva Limes Med 2013; 595:6387-56. Electronically Signed   By: Aram Candela M.D.   On: 09/20/2022 01:12   US OB Comp AddL Gest Less 14 Wks  Result Date: 09/20/2022 CLINICAL DATA:  Nausea and vomiting with vaginal spotting. EXAM:  TWIN OBSTETRIC <14WK [place] AND TRANSVAGINAL OB US TECHNIQUE: Both transabdominal and transvaginal ultrasound examinations were performed for complete evaluation of the gestation as well as the maternal uterus, adnexal regions, and pelvic cul-de-sac. Transvaginal technique was performed to assess early pregnancy. COMPARISON:  None Available. FINDINGS: Number of IUPs:  2 Chorionicity/Amnionicity:  Dichorionic-diamniotic (thick membrane) TWIN 1 Yolk sac:  Visualized. Embryo:  Visualized. Cardiac Activity: Visualized. Heart Rate:  109 bpm CRL:  2.4 mm   5 w 5 d                  [place] EDC: May 18, 2023 TWIN 2 Yolk sac:  Visualized. Embryo:  Not Visualized. Cardiac Activity: Not Visualized. Heart Rate: N/A bpm MSD: 8.4 mm   5 w   4 d              [place] EDC: May 19, 2023 Subchorionic hemorrhage:  None visualized. Maternal uterus/adnexae: Right ovary is visualized and is normal in appearance. A corpus luteum cyst is seen within otherwise normal appearing left ovary. No pelvic free fluid is seen. IMPRESSION: Dichorionic/diamniotic intrauterine twin pregnancy with a viable Fetus A (at approximately 5 weeks and 5 days gestation by ultrasound evaluation) and probable early yolk sac, but no fetal pole or cardiac activity yet visualized for Fetus B. Recommend follow-up quantitative B-HCG levels and follow-up US in 14 days to assess viability. This recommendation follows SRU consensus guidelines: Diagnostic Criteria for Nonviable Pregnancy Early in the First Trimester. Malva Limes Med 2013; 638:7564-33. Electronically Signed   By: Aram Candela M.D.   On: 09/20/2022 01:12    MDM - UA cloudy with 20 of ketones otherwise normal. - Hgb elevated otherwise normal. Low suspicion for infection.  - Wet prep normal  - GC pending upon discharge.  - US revealed di/di twin gestation measuring about 5 weeks.  - Nausea and vomiting improved with IV fluids and antiemetics.  - PO challenge successful.  - plan for discharge.   Assessment and Plan   1. Drug use affecting pregnancy in first trimester   2. Dichorionic diamniotic twin pregnancy in first trimester   3. Vaginal spotting   4. [redacted] weeks gestation of pregnancy   5. Nausea and vomiting, unspecified vomiting type    - Reviewed di/di twin pregnancy with patient.  - Discussed options of OUD and other ilicit drugs, but given patient current under county custody, may not be able to arrange for outpatient methadone/suboxone clinic. Patient chart to be sent to Dr. Crissie Reese for follow up.  - Worsening  signs and return precautions reviewed.  - Rx for zofran sent to pharmacy.  - Patient discharged into care of officer in stable condition and may return to MAU as needed.   Claudette Head, MSN CNM  09/20/2022, 3:30 AM

## 2022-09-20 ENCOUNTER — Inpatient Hospital Stay (HOSPITAL_COMMUNITY)

## 2022-09-20 DIAGNOSIS — O30041 Twin pregnancy, dichorionic/diamniotic, first trimester: Secondary | ICD-10-CM

## 2022-09-20 DIAGNOSIS — Z3A01 Less than 8 weeks gestation of pregnancy: Secondary | ICD-10-CM

## 2022-09-20 DIAGNOSIS — O99321 Drug use complicating pregnancy, first trimester: Secondary | ICD-10-CM | POA: Diagnosis present

## 2022-09-20 DIAGNOSIS — N939 Abnormal uterine and vaginal bleeding, unspecified: Secondary | ICD-10-CM

## 2022-09-20 DIAGNOSIS — R112 Nausea with vomiting, unspecified: Secondary | ICD-10-CM

## 2022-09-20 LAB — HCG, QUANTITATIVE, PREGNANCY: hCG, Beta Chain, Quant, S: 30256 m[IU]/mL — ABNORMAL HIGH (ref <5)

## 2022-09-20 LAB — GC/CHLAMYDIA PROBE AMP (~~LOC~~) NOT AT ARMC
Chlamydia: NEGATIVE
Comment: NEGATIVE
Comment: NORMAL
Neisseria Gonorrhea: NEGATIVE

## 2022-09-20 MED ORDER — SCOPOLAMINE 1 MG/3DAYS TD PT72
1.0000 | MEDICATED_PATCH | Freq: Once | TRANSDERMAL | Status: DC
  Administered 2022-09-20: 1.5 mg via TRANSDERMAL
  Filled 2022-09-20: qty 1

## 2022-09-20 MED ORDER — ONDANSETRON HCL 8 MG PO TABS
8.0000 mg | ORAL_TABLET | Freq: Three times a day (TID) | ORAL | 0 refills | Status: AC | PRN
Start: 2022-09-20 — End: ?

## 2022-09-20 MED ORDER — SODIUM CHLORIDE 0.9 % IV SOLN
25.0000 mg | Freq: Once | INTRAVENOUS | Status: AC
  Administered 2022-09-20: 25 mg via INTRAVENOUS
  Filled 2022-09-20: qty 1

## 2022-09-20 MED ORDER — ONDANSETRON 4 MG PO TBDP
4.0000 mg | ORAL_TABLET | Freq: Once | ORAL | Status: AC
  Administered 2022-09-20: 4 mg via ORAL
  Filled 2022-09-20: qty 1

## 2022-09-20 MED ORDER — ONDANSETRON 4 MG PO TBDP
8.0000 mg | ORAL_TABLET | Freq: Once | ORAL | Status: DC
Start: 2022-09-20 — End: 2022-09-20
